# Patient Record
Sex: Female | Born: 1970 | Race: Black or African American | Hispanic: No | Marital: Married | Smoking: Former smoker
Health system: Southern US, Community
[De-identification: ages and names within clinical notes are randomized; demographics above are authoritative.]

## PROBLEM LIST (undated history)

## (undated) DIAGNOSIS — N83209 Unspecified ovarian cyst, unspecified side: Secondary | ICD-10-CM

## (undated) DIAGNOSIS — M533 Sacrococcygeal disorders, not elsewhere classified: Secondary | ICD-10-CM

## (undated) DIAGNOSIS — F32A Depression, unspecified: Secondary | ICD-10-CM

## (undated) DIAGNOSIS — R5383 Other fatigue: Secondary | ICD-10-CM

## (undated) DIAGNOSIS — K279 Peptic ulcer, site unspecified, unspecified as acute or chronic, without hemorrhage or perforation: Secondary | ICD-10-CM

## (undated) DIAGNOSIS — K635 Polyp of colon: Secondary | ICD-10-CM

## (undated) DIAGNOSIS — F329 Major depressive disorder, single episode, unspecified: Secondary | ICD-10-CM

## (undated) DIAGNOSIS — I10 Essential (primary) hypertension: Secondary | ICD-10-CM

## (undated) DIAGNOSIS — K219 Gastro-esophageal reflux disease without esophagitis: Secondary | ICD-10-CM

## (undated) DIAGNOSIS — D649 Anemia, unspecified: Secondary | ICD-10-CM

## (undated) DIAGNOSIS — B999 Unspecified infectious disease: Secondary | ICD-10-CM

## (undated) DIAGNOSIS — K8020 Calculus of gallbladder without cholecystitis without obstruction: Secondary | ICD-10-CM

## (undated) DIAGNOSIS — O2230 Deep phlebothrombosis in pregnancy, unspecified trimester: Secondary | ICD-10-CM

## (undated) DIAGNOSIS — G709 Myoneural disorder, unspecified: Secondary | ICD-10-CM

## (undated) DIAGNOSIS — R87629 Unspecified abnormal cytological findings in specimens from vagina: Secondary | ICD-10-CM

## (undated) DIAGNOSIS — E7800 Pure hypercholesterolemia, unspecified: Secondary | ICD-10-CM

## (undated) DIAGNOSIS — E669 Obesity, unspecified: Secondary | ICD-10-CM

## (undated) DIAGNOSIS — M1990 Unspecified osteoarthritis, unspecified site: Secondary | ICD-10-CM

## (undated) DIAGNOSIS — K449 Diaphragmatic hernia without obstruction or gangrene: Secondary | ICD-10-CM

## (undated) HISTORY — DX: Anemia, unspecified: D64.9

## (undated) HISTORY — PX: COLONOSCOPY: SHX174

## (undated) HISTORY — DX: Deep phlebothrombosis in pregnancy, unspecified trimester: O22.30

## (undated) HISTORY — DX: Obesity, unspecified: E66.9

## (undated) HISTORY — DX: Polyp of colon: K63.5

## (undated) HISTORY — DX: Diaphragmatic hernia without obstruction or gangrene: K44.9

## (undated) HISTORY — DX: Essential (primary) hypertension: I10

## (undated) HISTORY — DX: Unspecified ovarian cyst, unspecified side: N83.209

## (undated) HISTORY — DX: Gastro-esophageal reflux disease without esophagitis: K21.9

## (undated) HISTORY — DX: Calculus of gallbladder without cholecystitis without obstruction: K80.20

## (undated) HISTORY — PX: POLYPECTOMY: SHX149

## (undated) HISTORY — PX: BREAST LUMPECTOMY: SHX2

## (undated) HISTORY — DX: Other fatigue: R53.83

## (undated) HISTORY — PX: BREAST EXCISIONAL BIOPSY: SUR124

## (undated) HISTORY — DX: Sacrococcygeal disorders, not elsewhere classified: M53.3

## (undated) HISTORY — DX: Pure hypercholesterolemia, unspecified: E78.00

## (undated) HISTORY — PX: TUBAL LIGATION: SHX77

## (undated) HISTORY — DX: Peptic ulcer, site unspecified, unspecified as acute or chronic, without hemorrhage or perforation: K27.9

## (undated) HISTORY — DX: Major depressive disorder, single episode, unspecified: F32.9

## (undated) HISTORY — DX: Depression, unspecified: F32.A

## (undated) HISTORY — DX: Unspecified osteoarthritis, unspecified site: M19.90

---

## 2001-06-14 ENCOUNTER — Encounter: Admission: RE | Admit: 2001-06-14 | Discharge: 2001-06-14 | Payer: Self-pay | Admitting: Family Medicine

## 2001-07-22 ENCOUNTER — Emergency Department (HOSPITAL_COMMUNITY): Admission: EM | Admit: 2001-07-22 | Discharge: 2001-07-22 | Payer: Self-pay | Admitting: Emergency Medicine

## 2001-07-23 ENCOUNTER — Encounter: Admission: RE | Admit: 2001-07-23 | Discharge: 2001-07-23 | Payer: Self-pay | Admitting: Family Medicine

## 2001-07-26 ENCOUNTER — Encounter: Admission: RE | Admit: 2001-07-26 | Discharge: 2001-07-26 | Payer: Self-pay | Admitting: Family Medicine

## 2001-07-31 ENCOUNTER — Encounter: Admission: RE | Admit: 2001-07-31 | Discharge: 2001-07-31 | Payer: Self-pay | Admitting: Family Medicine

## 2001-07-31 ENCOUNTER — Ambulatory Visit (HOSPITAL_COMMUNITY): Admission: RE | Admit: 2001-07-31 | Discharge: 2001-07-31 | Payer: Self-pay | Admitting: Family Medicine

## 2001-07-31 ENCOUNTER — Ambulatory Visit (HOSPITAL_COMMUNITY): Admission: RE | Admit: 2001-07-31 | Discharge: 2001-07-31 | Payer: Self-pay | Admitting: *Deleted

## 2001-08-05 ENCOUNTER — Emergency Department (HOSPITAL_COMMUNITY): Admission: EM | Admit: 2001-08-05 | Discharge: 2001-08-05 | Payer: Self-pay

## 2001-08-07 ENCOUNTER — Encounter: Admission: RE | Admit: 2001-08-07 | Discharge: 2001-08-07 | Payer: Self-pay | Admitting: Family Medicine

## 2001-08-10 ENCOUNTER — Emergency Department (HOSPITAL_COMMUNITY): Admission: EM | Admit: 2001-08-10 | Discharge: 2001-08-10 | Payer: Self-pay | Admitting: Emergency Medicine

## 2001-08-13 ENCOUNTER — Emergency Department (HOSPITAL_COMMUNITY): Admission: EM | Admit: 2001-08-13 | Discharge: 2001-08-14 | Payer: Self-pay | Admitting: Emergency Medicine

## 2001-08-13 ENCOUNTER — Encounter: Admission: RE | Admit: 2001-08-13 | Discharge: 2001-08-13 | Payer: Self-pay | Admitting: Family Medicine

## 2001-09-26 ENCOUNTER — Encounter: Admission: RE | Admit: 2001-09-26 | Discharge: 2001-09-26 | Payer: Self-pay | Admitting: Family Medicine

## 2001-11-04 ENCOUNTER — Encounter: Admission: RE | Admit: 2001-11-04 | Discharge: 2001-11-04 | Payer: Self-pay | Admitting: Family Medicine

## 2001-11-06 ENCOUNTER — Encounter: Admission: RE | Admit: 2001-11-06 | Discharge: 2001-11-06 | Payer: Self-pay | Admitting: Family Medicine

## 2001-12-04 ENCOUNTER — Encounter: Admission: RE | Admit: 2001-12-04 | Discharge: 2002-01-02 | Payer: Self-pay | Admitting: Sports Medicine

## 2002-02-05 ENCOUNTER — Encounter: Admission: RE | Admit: 2002-02-05 | Discharge: 2002-02-05 | Payer: Self-pay | Admitting: Family Medicine

## 2002-07-30 ENCOUNTER — Encounter (INDEPENDENT_AMBULATORY_CARE_PROVIDER_SITE_OTHER): Payer: Self-pay | Admitting: *Deleted

## 2002-07-31 ENCOUNTER — Encounter: Admission: RE | Admit: 2002-07-31 | Discharge: 2002-07-31 | Payer: Self-pay | Admitting: Pediatrics

## 2002-08-18 ENCOUNTER — Encounter: Admission: RE | Admit: 2002-08-18 | Discharge: 2002-08-18 | Payer: Self-pay | Admitting: Family Medicine

## 2002-10-14 ENCOUNTER — Encounter: Admission: RE | Admit: 2002-10-14 | Discharge: 2002-10-14 | Payer: Self-pay | Admitting: Family Medicine

## 2002-10-31 ENCOUNTER — Encounter: Admission: RE | Admit: 2002-10-31 | Discharge: 2002-10-31 | Payer: Self-pay | Admitting: Family Medicine

## 2002-12-18 ENCOUNTER — Encounter: Payer: Self-pay | Admitting: Chiropractic Medicine

## 2002-12-18 ENCOUNTER — Encounter: Admission: RE | Admit: 2002-12-18 | Discharge: 2002-12-18 | Payer: Self-pay | Admitting: Chiropractic Medicine

## 2003-01-29 ENCOUNTER — Encounter: Admission: RE | Admit: 2003-01-29 | Discharge: 2003-01-29 | Payer: Self-pay | Admitting: Family Medicine

## 2003-12-12 ENCOUNTER — Emergency Department (HOSPITAL_COMMUNITY): Admission: EM | Admit: 2003-12-12 | Discharge: 2003-12-12 | Payer: Self-pay | Admitting: Emergency Medicine

## 2004-01-13 ENCOUNTER — Encounter: Payer: Self-pay | Admitting: Gastroenterology

## 2004-08-24 ENCOUNTER — Encounter: Admission: RE | Admit: 2004-08-24 | Discharge: 2004-08-24 | Payer: Self-pay | Admitting: Family Medicine

## 2004-10-29 ENCOUNTER — Encounter: Admission: RE | Admit: 2004-10-29 | Discharge: 2004-10-29 | Payer: Self-pay | Admitting: Rheumatology

## 2005-01-09 ENCOUNTER — Emergency Department (HOSPITAL_COMMUNITY): Admission: EM | Admit: 2005-01-09 | Discharge: 2005-01-09 | Payer: Self-pay | Admitting: Emergency Medicine

## 2005-11-23 ENCOUNTER — Emergency Department (HOSPITAL_COMMUNITY): Admission: EM | Admit: 2005-11-23 | Discharge: 2005-11-23 | Payer: Self-pay | Admitting: Emergency Medicine

## 2006-01-13 ENCOUNTER — Emergency Department (HOSPITAL_COMMUNITY): Admission: EM | Admit: 2006-01-13 | Discharge: 2006-01-13 | Payer: Self-pay | Admitting: Family Medicine

## 2007-01-24 DIAGNOSIS — R002 Palpitations: Secondary | ICD-10-CM | POA: Insufficient documentation

## 2007-01-24 DIAGNOSIS — K219 Gastro-esophageal reflux disease without esophagitis: Secondary | ICD-10-CM | POA: Insufficient documentation

## 2007-01-25 ENCOUNTER — Encounter (INDEPENDENT_AMBULATORY_CARE_PROVIDER_SITE_OTHER): Payer: Self-pay | Admitting: *Deleted

## 2007-05-13 ENCOUNTER — Encounter: Admission: RE | Admit: 2007-05-13 | Discharge: 2007-05-13 | Payer: Self-pay | Admitting: Family Medicine

## 2007-06-18 ENCOUNTER — Emergency Department (HOSPITAL_COMMUNITY): Admission: EM | Admit: 2007-06-18 | Discharge: 2007-06-18 | Payer: Self-pay | Admitting: Emergency Medicine

## 2007-11-22 ENCOUNTER — Emergency Department (HOSPITAL_COMMUNITY): Admission: EM | Admit: 2007-11-22 | Discharge: 2007-11-22 | Payer: Self-pay | Admitting: Family Medicine

## 2008-06-07 ENCOUNTER — Emergency Department (HOSPITAL_COMMUNITY): Admission: EM | Admit: 2008-06-07 | Discharge: 2008-06-07 | Payer: Self-pay | Admitting: Family Medicine

## 2008-08-19 ENCOUNTER — Emergency Department (HOSPITAL_COMMUNITY): Admission: EM | Admit: 2008-08-19 | Discharge: 2008-08-19 | Payer: Self-pay | Admitting: Family Medicine

## 2008-12-16 ENCOUNTER — Ambulatory Visit (HOSPITAL_COMMUNITY): Admission: RE | Admit: 2008-12-16 | Discharge: 2008-12-16 | Payer: Self-pay | Admitting: Family Medicine

## 2009-03-03 ENCOUNTER — Ambulatory Visit: Payer: Self-pay | Admitting: Gastroenterology

## 2009-03-08 ENCOUNTER — Emergency Department (HOSPITAL_COMMUNITY): Admission: EM | Admit: 2009-03-08 | Discharge: 2009-03-08 | Payer: Self-pay | Admitting: Family Medicine

## 2009-04-06 ENCOUNTER — Ambulatory Visit (HOSPITAL_COMMUNITY): Admission: RE | Admit: 2009-04-06 | Discharge: 2009-04-06 | Payer: Self-pay | Admitting: Internal Medicine

## 2010-06-14 ENCOUNTER — Ambulatory Visit (HOSPITAL_COMMUNITY): Admission: RE | Admit: 2010-06-14 | Discharge: 2010-06-14 | Payer: Self-pay | Admitting: Family Medicine

## 2010-07-23 ENCOUNTER — Ambulatory Visit: Payer: Self-pay | Admitting: Obstetrics and Gynecology

## 2010-07-23 ENCOUNTER — Other Ambulatory Visit: Payer: Self-pay | Admitting: Obstetrics & Gynecology

## 2010-07-23 ENCOUNTER — Emergency Department (HOSPITAL_COMMUNITY): Admission: EM | Admit: 2010-07-23 | Discharge: 2010-07-23 | Payer: Self-pay | Admitting: Emergency Medicine

## 2010-11-27 HISTORY — PX: COLPOSCOPY: SHX161

## 2010-11-27 LAB — HM PAP SMEAR

## 2010-12-18 ENCOUNTER — Encounter: Payer: Self-pay | Admitting: Family Medicine

## 2011-01-12 ENCOUNTER — Emergency Department (HOSPITAL_COMMUNITY): Payer: 59

## 2011-01-12 ENCOUNTER — Emergency Department (HOSPITAL_COMMUNITY)
Admission: EM | Admit: 2011-01-12 | Discharge: 2011-01-13 | Disposition: A | Discharge status: Home / Self Care | Payer: 59 | Attending: Emergency Medicine | Admitting: Emergency Medicine

## 2011-01-12 DIAGNOSIS — F3289 Other specified depressive episodes: Secondary | ICD-10-CM | POA: Insufficient documentation

## 2011-01-12 DIAGNOSIS — R799 Abnormal finding of blood chemistry, unspecified: Secondary | ICD-10-CM | POA: Insufficient documentation

## 2011-01-12 DIAGNOSIS — M25519 Pain in unspecified shoulder: Secondary | ICD-10-CM | POA: Insufficient documentation

## 2011-01-12 DIAGNOSIS — F329 Major depressive disorder, single episode, unspecified: Secondary | ICD-10-CM | POA: Insufficient documentation

## 2011-01-12 DIAGNOSIS — R002 Palpitations: Secondary | ICD-10-CM | POA: Insufficient documentation

## 2011-01-12 LAB — POCT I-STAT, CHEM 8
Calcium, Ion: 1.14 mmol/L (ref 1.12–1.32)
Chloride: 107 mEq/L (ref 96–112)
Glucose, Bld: 95 mg/dL (ref 70–99)
HCT: 36 % (ref 36.0–46.0)

## 2011-01-12 MED ORDER — IOHEXOL 300 MG/ML  SOLN: 100.0000 mL | Freq: Once | INTRAMUSCULAR | Status: AC | PRN

## 2011-01-13 MED ORDER — IOHEXOL 300 MG/ML  SOLN
100.0000 mL | Freq: Once | INTRAMUSCULAR | Status: AC | PRN
  Administered 2011-01-13: 100 mL via INTRAVENOUS

## 2011-02-10 LAB — URINALYSIS, ROUTINE W REFLEX MICROSCOPIC
Nitrite: NEGATIVE
Nitrite: NEGATIVE
Specific Gravity, Urine: <1.005 — ABNORMAL LOW (ref 1.005–1.030)
Specific Gravity, Urine: 1.005 (ref 1.005–1.030)
Urobilinogen, UA: 0.2 mg/dL (ref 0.0–1.0)
pH: 6.5 (ref 5.0–8.0)

## 2011-02-10 LAB — CBC
HCT: 36.2 % (ref 36.0–46.0)
Hemoglobin: 12.1 g/dL (ref 12.0–15.0)
MCH: 30.9 pg (ref 26.0–34.0)
MCHC: 33.5 g/dL (ref 30.0–36.0)

## 2011-02-10 LAB — COMPREHENSIVE METABOLIC PANEL
CO2: 29 mEq/L (ref 19–32)
Calcium: 9.5 mg/dL (ref 8.4–10.5)
Creatinine, Ser: 0.77 mg/dL (ref 0.4–1.2)
GFR calc Af Amer: >60 mL/min (ref >60)
GFR calc non Af Amer: >60 mL/min (ref >60)
Glucose, Bld: 99 mg/dL (ref 70–99)

## 2011-02-10 LAB — POCT CARDIAC MARKERS: Myoglobin, poc: 93.3 ng/mL (ref 12–200)

## 2011-04-19 ENCOUNTER — Other Ambulatory Visit: Payer: Self-pay | Admitting: Obstetrics and Gynecology

## 2011-04-19 ENCOUNTER — Ambulatory Visit (HOSPITAL_COMMUNITY)
Admission: RE | Admit: 2011-04-19 | Discharge: 2011-04-19 | Disposition: A | Discharge status: Home / Self Care | Payer: 59 | Source: Ambulatory Visit | Attending: Obstetrics and Gynecology | Admitting: Obstetrics and Gynecology

## 2011-04-19 DIAGNOSIS — N920 Excessive and frequent menstruation with regular cycle: Secondary | ICD-10-CM | POA: Insufficient documentation

## 2011-04-19 DIAGNOSIS — N840 Polyp of corpus uteri: Secondary | ICD-10-CM | POA: Insufficient documentation

## 2011-04-19 LAB — CBC
HCT: 36.6 % (ref 36.0–46.0)
MCH: 29.3 pg (ref 26.0–34.0)
MCV: 90.1 fL (ref 78.0–100.0)
Platelets: 271 10*3/uL (ref 150–400)
RBC: 4.06 MIL/uL (ref 3.87–5.11)
RDW: 13.2 % (ref 11.5–15.5)

## 2011-05-07 NOTE — Op Note (Signed)
  Rebecca, [name] NO.:  [id number]  MEDICAL RECORD NO.:  [id number]           PATIENT TYPE:  O  LOCATION:  [facility]                          [facility]  PHYSICIAN:  Maxie Better, M.D.DATE OF BIRTH:  04-18-1971  DATE OF PROCEDURE:  04/19/2011 DATE OF DISCHARGE:                              OPERATIVE REPORT   PREOPERATIVE DIAGNOSES:  Endometrial mass, menometrorrhagia/dysfunctional uterine bleeding.  PROCEDURES:  Diagnostic hysteroscopy, hysteroscopic resection, endometrial polyps, dilation and curettage.  POSTOPERATIVE DIAGNOSES:  Endometrial polyps, menometrorrhagia.  ANESTHESIA:  General, paracervical block.  SURGEON:  Maxie Better, MD  ASSISTANT:  None.  PROCEDURE:  Under adequate general anesthesia, the patient was placed in the dorsal lithotomy position.  She was sterilely prepped and draped in usual fashion.  The bladder was catheterized for large amount of urine. Examination under anesthesia revealed an anteverted uterus.  No adnexal masses could be appreciated.  Bivalve speculum was placed in the vagina. A 20 mL of 1% Nesacaine was injected paracervically at 3 and 9 o'clock position.  The anterior lip of the cervix was grasped with a single- tooth tenaculum.  The cervix was then serially dilated up to #27 [facility] dilator.  A resectoscope with a single loop was introduced into the uterine cavity.  Both tubal ostia's were seen.  Polypoid lesion was noted frontally and to the left of the left tubal ostia.  Focal endometrial thickening was noted anteriorly.  All of these areas were resected.  The resectoscope was then removed.  The cavity was then curetted for moderate amount of tissue.  The resectoscope was inserted. The cavity had no other lesions noted.  The endocervical canal was inspected.  All instruments were then removed from the vagina.  Specimen was endometrial curetting with endometrial polyps sent to pathology. Estimated  blood loss was minimal.  Fluid deficit was 20 mL. Complication was none.  The patient tolerated the procedure well and was transferred to recovery room in stable condition.     Maxie Better, M.D.     [place]/[facility]  D:  04/19/2011  T:  04/20/2011  Job:  [id number]  Electronically Signed by Nena Jordan Kelis Plasse M.D. on 05/07/2011 08:46:04 PM

## 2011-07-07 ENCOUNTER — Other Ambulatory Visit: Payer: Self-pay | Admitting: Obstetrics and Gynecology

## 2011-07-07 ENCOUNTER — Encounter: Payer: Self-pay | Admitting: Gastroenterology

## 2011-07-07 DIAGNOSIS — Z1231 Encounter for screening mammogram for malignant neoplasm of breast: Secondary | ICD-10-CM

## 2011-07-13 ENCOUNTER — Ambulatory Visit
Admission: RE | Admit: 2011-07-13 | Discharge: 2011-07-13 | Disposition: A | Discharge status: Home / Self Care | Payer: 59 | Source: Ambulatory Visit | Attending: Obstetrics and Gynecology | Admitting: Obstetrics and Gynecology

## 2011-07-13 DIAGNOSIS — Z1231 Encounter for screening mammogram for malignant neoplasm of breast: Secondary | ICD-10-CM

## 2011-08-03 ENCOUNTER — Ambulatory Visit: Payer: 59 | Admitting: Gastroenterology

## 2011-08-08 ENCOUNTER — Inpatient Hospital Stay (INDEPENDENT_AMBULATORY_CARE_PROVIDER_SITE_OTHER)
Admission: RE | Admit: 2011-08-08 | Discharge: 2011-08-08 | Disposition: A | Discharge status: Home / Self Care | Payer: 59 | Source: Ambulatory Visit | Attending: Family Medicine | Admitting: Family Medicine

## 2011-08-08 DIAGNOSIS — K2970 Gastritis, unspecified, without bleeding: Secondary | ICD-10-CM

## 2011-08-08 LAB — POCT URINALYSIS DIP (DEVICE)
Glucose, UA: NEGATIVE mg/dL
Hgb urine dipstick: NEGATIVE
Nitrite: NEGATIVE
Protein, ur: NEGATIVE mg/dL
Urobilinogen, UA: 0.2 mg/dL (ref 0.0–1.0)

## 2011-08-08 LAB — COMPREHENSIVE METABOLIC PANEL
BUN: 9 mg/dL (ref 6–23)
CO2: 28 mEq/L (ref 19–32)
Calcium: 9.9 mg/dL (ref 8.4–10.5)
Chloride: 103 mEq/L (ref 96–112)
Creatinine, Ser: 0.73 mg/dL (ref 0.50–1.10)
GFR calc Af Amer: >60 mL/min (ref >60)
GFR calc non Af Amer: >60 mL/min (ref >60)
Glucose, Bld: 104 mg/dL — ABNORMAL HIGH (ref 70–99)
Total Bilirubin: 0.4 mg/dL (ref 0.3–1.2)

## 2011-08-08 LAB — POCT PREGNANCY, URINE: Preg Test, Ur: NEGATIVE

## 2011-08-08 LAB — POCT I-STAT, CHEM 8
BUN: 9 mg/dL (ref 6–23)
Calcium, Ion: 1.19 mmol/L (ref 1.12–1.32)
Glucose, Bld: 104 mg/dL — ABNORMAL HIGH (ref 70–99)
TCO2: 26 mmol/L (ref 0–100)

## 2011-08-08 LAB — AMYLASE: Amylase: 106 U/L — ABNORMAL HIGH (ref 0–105)

## 2011-08-08 LAB — LIPASE, BLOOD: Lipase: 33 U/L (ref 11–59)

## 2011-08-24 LAB — POCT URINALYSIS DIP (DEVICE)
Bilirubin Urine: NEGATIVE
Hgb urine dipstick: NEGATIVE
Ketones, ur: NEGATIVE
Protein, ur: NEGATIVE
Specific Gravity, Urine: 1.015
pH: 5.5

## 2011-08-24 LAB — URINE CULTURE: Colony Count: >=100000

## 2011-08-28 LAB — POCT URINALYSIS DIP (DEVICE)
Glucose, UA: NEGATIVE
Nitrite: NEGATIVE
Operator id: 247071
Protein, ur: NEGATIVE
Specific Gravity, Urine: 1.01
Urobilinogen, UA: 0.2

## 2011-08-29 ENCOUNTER — Encounter: Payer: Self-pay | Admitting: Internal Medicine

## 2011-09-01 LAB — STREP A DNA PROBE

## 2011-09-01 LAB — POCT RAPID STREP A: Streptococcus, Group A Screen (Direct): NEGATIVE

## 2011-09-14 ENCOUNTER — Encounter: Payer: Self-pay | Admitting: Internal Medicine

## 2011-09-21 ENCOUNTER — Ambulatory Visit: Payer: 59 | Admitting: Internal Medicine

## 2011-11-18 ENCOUNTER — Emergency Department (HOSPITAL_COMMUNITY)
Admission: EM | Admit: 2011-11-18 | Discharge: 2011-11-18 | Disposition: A | Discharge status: Home / Self Care | Payer: 59 | Attending: Emergency Medicine | Admitting: Emergency Medicine

## 2011-11-18 ENCOUNTER — Encounter (HOSPITAL_COMMUNITY): Payer: Self-pay | Admitting: *Deleted

## 2011-11-18 ENCOUNTER — Other Ambulatory Visit: Payer: Self-pay

## 2011-11-18 ENCOUNTER — Emergency Department (HOSPITAL_COMMUNITY): Payer: 59

## 2011-11-18 DIAGNOSIS — R10812 Left upper quadrant abdominal tenderness: Secondary | ICD-10-CM | POA: Insufficient documentation

## 2011-11-18 DIAGNOSIS — K8020 Calculus of gallbladder without cholecystitis without obstruction: Secondary | ICD-10-CM | POA: Insufficient documentation

## 2011-11-18 DIAGNOSIS — R109 Unspecified abdominal pain: Secondary | ICD-10-CM | POA: Insufficient documentation

## 2011-11-18 DIAGNOSIS — N200 Calculus of kidney: Secondary | ICD-10-CM | POA: Insufficient documentation

## 2011-11-18 LAB — CBC
HCT: 37.7 % (ref 36.0–46.0)
Hemoglobin: 12.7 g/dL (ref 12.0–15.0)
MCHC: 33.7 g/dL (ref 30.0–36.0)
MCV: 88.9 fL (ref 78.0–100.0)
RDW: 13 % (ref 11.5–15.5)
WBC: 5.2 10*3/uL (ref 4.0–10.5)

## 2011-11-18 LAB — URINALYSIS, ROUTINE W REFLEX MICROSCOPIC
Ketones, ur: NEGATIVE mg/dL
Leukocytes, UA: NEGATIVE
Nitrite: NEGATIVE
Protein, ur: NEGATIVE mg/dL
Urobilinogen, UA: 0.2 mg/dL (ref 0.0–1.0)

## 2011-11-18 LAB — LIPASE, BLOOD: Lipase: 44 U/L (ref 11–59)

## 2011-11-18 LAB — COMPREHENSIVE METABOLIC PANEL
BUN: 11 mg/dL (ref 6–23)
CO2: 25 mEq/L (ref 19–32)
Calcium: 9.6 mg/dL (ref 8.4–10.5)
Chloride: 104 mEq/L (ref 96–112)
Creatinine, Ser: 0.78 mg/dL (ref 0.50–1.10)
GFR calc Af Amer: >90 mL/min (ref >90)
GFR calc non Af Amer: >90 mL/min (ref >90)
Total Bilirubin: 0.5 mg/dL (ref 0.3–1.2)

## 2011-11-18 LAB — DIFFERENTIAL
Basophils Absolute: 0 10*3/uL (ref 0.0–0.1)
Eosinophils Relative: 3 % (ref 0–5)
Lymphocytes Relative: 48 % — ABNORMAL HIGH (ref 12–46)
Monocytes Absolute: 0.6 10*3/uL (ref 0.1–1.0)
Monocytes Relative: 11 % (ref 3–12)
Neutro Abs: 2 10*3/uL (ref 1.7–7.7)

## 2011-11-18 LAB — URINE MICROSCOPIC-ADD ON

## 2011-11-18 MED ORDER — HYDROCODONE-ACETAMINOPHEN 5-325 MG PO TABS: 1.0000 | ORAL_TABLET | Freq: Four times a day (QID) | ORAL | Status: AC | PRN

## 2011-11-18 MED ORDER — KETOROLAC TROMETHAMINE 30 MG/ML IJ SOLN
30.0000 mg | Freq: Once | INTRAMUSCULAR | Status: AC
  Administered 2011-11-18: 30 mg via INTRAVENOUS
  Filled 2011-11-18: qty 19

## 2011-11-18 MED ORDER — HYDROMORPHONE HCL PF 1 MG/ML IJ SOLN
1.0000 mg | Freq: Once | INTRAMUSCULAR | Status: DC
  Filled 2011-11-18: qty 1

## 2011-11-18 MED ORDER — ACETAMINOPHEN 325 MG PO TABS
650.0000 mg | ORAL_TABLET | Freq: Once | ORAL | Status: AC
  Administered 2011-11-18: 650 mg via ORAL
  Filled 2011-11-18: qty 2

## 2011-11-18 MED ORDER — ONDANSETRON HCL 4 MG/2ML IJ SOLN
4.0000 mg | Freq: Once | INTRAMUSCULAR | Status: AC
  Administered 2011-11-18: 4 mg via INTRAVENOUS
  Filled 2011-11-18: qty 2

## 2011-11-18 MED ORDER — SODIUM CHLORIDE 0.9 % IV SOLN
999.0000 mL | INTRAVENOUS | Status: DC
  Administered 2011-11-18: 999 mL via INTRAVENOUS

## 2011-11-18 NOTE — ED Notes (Signed)
Patient is resting comfortably. 

## 2011-11-18 NOTE — ED Notes (Signed)
Patient c/o pain in the LUQ and states that she has had problems w/ her bowels before and that sometimes she has to disimpact herself for relief.  At present she states that she has her period which makes her bowels worse.  Her PCP is attempting to get her an appointment with a GI physician.  Abdomen is soft and tender only in the LUQ.

## 2011-11-18 NOTE — ED Provider Notes (Signed)
History     CSN: [id number]  Arrival date & time 11/18/11  1102   First MD Initiated Contact with Patient 11/18/11 1126      Chief Complaint  Patient presents with  . Abdominal Pain  . Nausea    (Consider location/radiation/quality/duration/timing/severity/associated sxs/prior treatment) HPI Patient has been having pain in her left upper abdomen for several months. Patient states she has seen her primary Dr. and had some blood tests and was started on antacids. She is waiting for a referral to a GI specialist. Patient denies any vomiting or diarrhea although she has had some nausea. She has had some episodes of constipation as well. She has been able to eat and drink although she does feel like her appetite has been decreased.  She has not noticed any blood in her stool. She has not had any chest pain or shortness of breath. Patient does have history of problems with esophagitis and duodenitis in the past. Past Medical History  Diagnosis Date  . 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 Anxiety   . PUD (peptic ulcer disease)   . Hiatal hernia   . Duodenitis   . Esophagitis   . Vitamin D deficiency   . Fatigue   . Sacroiliac pain   . Depression     Past Surgical History  Procedure Date  . Tubal ligation   . Breast lumpectomy     benign    Family History  Problem Relation Age of Onset  . Aneurysm Mother   . Heart disease Mother   . Diabetes Maternal Aunt     History  Substance Use Topics  . Smoking status: Current Some Day Smoker  . Smokeless tobacco: Not on file  . Alcohol Use: No    OB History    Grav Para Term Preterm Abortions TAB SAB Ect Mult Living                  Review of Systems  All other systems reviewed and are negative.    Allergies  Review of patient's allergies indicates no known allergies.  Home Medications   Current Outpatient Rx  Name Route Sig Dispense Refill  . VITAMIN B COMPLEX PO Oral Take 1 tablet by mouth daily.      Marland Kitchen  VITAMIN D3 50000 UNITS PO CAPS Oral Take 1 capsule by mouth 2 (two) times daily.      Marland Kitchen CITALOPRAM HYDROBROMIDE 10 MG PO TABS Oral Take 10 mg by mouth daily.      Carma Leaven M PLUS PO TABS Oral Take 1 tablet by mouth daily.      Marland Kitchen PANTOPRAZOLE SODIUM 40 MG PO TBEC Oral Take 40 mg by mouth daily.      . DULOXETINE HCL 30 MG PO CPEP Oral Take 30 mg by mouth daily.        BP 132/62  Pulse 114  Temp(Src) 98.3 F (36.8 C) (Oral)  Resp 22  SpO2 100%  Physical Exam  Nursing note and vitals reviewed. Constitutional: She appears well-developed and well-nourished. No distress.  HENT:  Head: Normocephalic and atraumatic.  Right Ear: External ear normal.  Left Ear: External ear normal.  Eyes: Conjunctivae are normal. Right eye exhibits no discharge. Left eye exhibits no discharge. No scleral icterus.  Neck: Neck supple. No tracheal deviation present.  Cardiovascular: Normal rate, regular rhythm and intact distal pulses.   Pulmonary/Chest: Effort normal and breath sounds normal. No stridor. No respiratory distress. She has no  wheezes. She has no rales.  Abdominal: Soft. Bowel sounds are normal. She exhibits no distension and no mass (mild left upper quadrant). There is tenderness. There is no rebound and no guarding.  Musculoskeletal: She exhibits no edema and no tenderness.  Neurological: She is alert. She has normal strength. No sensory deficit. Cranial nerve deficit:  no gross defecits noted. She exhibits normal muscle tone. She displays no seizure activity. Coordination normal.  Skin: Skin is warm and dry. No rash noted.  Psychiatric: She has a normal mood and affect.    ED Course  Procedures (including critical care time)  Date: 11/18/2011  Rate: 72  Rhythm: normal sinus rhythm  QRS Axis: normal  Intervals: normal  ST/T Wave abnormalities: normal  Conduction Disutrbances:none  Narrative Interpretation:   Old EKG Reviewed: unchanged   Labs Reviewed  DIFFERENTIAL - Abnormal; Notable  for the following:    Neutrophils Relative 38 (*)    Lymphocytes Relative 48 (*)    All other components within normal limits  URINALYSIS, ROUTINE W REFLEX MICROSCOPIC - Abnormal; Notable for the following:    Hgb urine dipstick LARGE (*)    All other components within normal limits  CBC  COMPREHENSIVE METABOLIC PANEL  LIPASE, BLOOD  URINE MICROSCOPIC-ADD ON   Ct Abdomen Pelvis Wo Contrast  11/18/2011  *RADIOLOGY REPORT*  Clinical Data: Hematuria, rule out stones  CT ABDOMEN AND PELVIS WITHOUT CONTRAST  Technique:  Multidetector CT imaging of the abdomen and pelvis was performed following the standard protocol without intravenous contrast.  Comparison: CT thorax 01/12/2011  Findings: Lung bases are clear.  No pericardial fluid.  Non-IV contrast images demonstrate no focal hepatic lesion.  There is a small 6 mm gallstone in the gallbladder.  The pancreas, spleen, adrenal glands, and adrenal glands are normal.  There is a 1 mm nonobstructing calculus within the left kidney.  No evidence of ureterolithiasis or obstructive uropathy.  There multiple vascular calcifications within the pelvis.  Cannot exclude distal ureteral stone  The stomach, small bowel, and colon are normal.  Abdominal aorta is normal caliber.  No retroperitoneal lymphadenopathy.  No free fluid in the pelvis.  Uterus and ovaries are normal.  The pelvic lymphadenopathy. Review of  bone windows demonstrates no aggressive osseous lesions.  IMPRESSION:  1.  Left nephrolithiasis without evidence of obstructive uropathy. 2.  Multiple vascular calcifications within the lower pelvis.  No convincing evidence of ureteral calculus. 3.  Small gallstone without evidence of cholecystitis.  Original Report Authenticated By: Genevive Bi, M.D.     1. Abdominal pain   2. Kidney stones     12:44 PM Pt refused dilaudid.  She needs to drive home.  MDM  Patient does have kidney stones on CT scan but there is no evidence of urolithiasis.  Therefore not certain if this is related to the pain that she's been having. This has been ongoing for months she is scheduled to see a GI Dr. At this point there does not appear to be any evidence of an acute emergency medical condition. Patient is already on antacids. I will discharge her home with a prescription for pain medications.        Celene Kras, MD 11/18/11 [phone]

## 2011-11-18 NOTE — ED Notes (Signed)
Pt presents with abd pain, left upper quad, has been to PMD and waiting for appointment with GI, pt c/o pain, crying in triage, has nausea, constipation reported, last BM this am

## 2011-11-24 ENCOUNTER — Encounter: Payer: Self-pay | Admitting: Internal Medicine

## 2011-12-01 ENCOUNTER — Encounter: Payer: Self-pay | Admitting: Internal Medicine

## 2011-12-05 ENCOUNTER — Encounter: Payer: Self-pay | Admitting: Internal Medicine

## 2011-12-12 ENCOUNTER — Ambulatory Visit (INDEPENDENT_AMBULATORY_CARE_PROVIDER_SITE_OTHER): Payer: 59 | Admitting: Internal Medicine

## 2011-12-12 ENCOUNTER — Other Ambulatory Visit (INDEPENDENT_AMBULATORY_CARE_PROVIDER_SITE_OTHER): Payer: 59

## 2011-12-12 ENCOUNTER — Telehealth: Payer: Self-pay | Admitting: *Deleted

## 2011-12-12 ENCOUNTER — Encounter: Payer: Self-pay | Admitting: Internal Medicine

## 2011-12-12 DIAGNOSIS — R194 Change in bowel habit: Secondary | ICD-10-CM

## 2011-12-12 DIAGNOSIS — E559 Vitamin D deficiency, unspecified: Secondary | ICD-10-CM | POA: Insufficient documentation

## 2011-12-12 DIAGNOSIS — F325 Major depressive disorder, single episode, in full remission: Secondary | ICD-10-CM | POA: Insufficient documentation

## 2011-12-12 DIAGNOSIS — R198 Other specified symptoms and signs involving the digestive system and abdomen: Secondary | ICD-10-CM

## 2011-12-12 DIAGNOSIS — K3189 Other diseases of stomach and duodenum: Secondary | ICD-10-CM

## 2011-12-12 DIAGNOSIS — K5900 Constipation, unspecified: Secondary | ICD-10-CM | POA: Insufficient documentation

## 2011-12-12 DIAGNOSIS — R1013 Epigastric pain: Secondary | ICD-10-CM

## 2011-12-12 DIAGNOSIS — F3341 Major depressive disorder, recurrent, in partial remission: Secondary | ICD-10-CM | POA: Insufficient documentation

## 2011-12-12 MED ORDER — HYOSCYAMINE SULFATE 0.125 MG SL SUBL: 0.1250 mg | SUBLINGUAL_TABLET | SUBLINGUAL | Status: AC | PRN

## 2011-12-12 MED ORDER — PEG-KCL-NACL-NASULF-NA ASC-C 100 G PO SOLR: 1.0000 | Freq: Once | ORAL | Status: DC

## 2011-12-12 MED ORDER — POLYETHYLENE GLYCOL 3350 17 G PO PACK: 17.0000 g | PACK | Freq: Every day | ORAL | Status: AC

## 2011-12-12 MED ORDER — ESOMEPRAZOLE MAGNESIUM 20 MG PO CPDR: 20.0000 mg | DELAYED_RELEASE_CAPSULE | Freq: Every day | ORAL | Status: DC

## 2011-12-12 NOTE — Telephone Encounter (Signed)
Message copied by Florene Glen on Tue Dec 12, 2011  3:17 PM ------      Message from: Beverley Fiedler      Created: Tue Dec 12, 2011  1:20 PM       Thyroid normal.

## 2011-12-12 NOTE — Telephone Encounter (Signed)
Notified pt that Dr Rhea Belton states her thyroid is normal; pt stated understanding.

## 2011-12-12 NOTE — Patient Instructions (Addendum)
You have been given a separate informational sheet regarding your tobacco use, the importance of quitting and local resources to help you quit.  You have been scheduled for a colonoscopy/Endoscopy. Please follow written instructions given to you at your visit today.  Please pick up your prep kit at the pharmacy within the next 2-3 days. We have sent the following medications to your pharmacy for you to pick up at your convenience: Movieprep. levsin  You have been given samples of Nexium: take one tablet daily and Miralax: Take as directed  Your physician has requested that you go to the basement for the following lab work before leaving today:  TSH     .

## 2011-12-12 NOTE — Progress Notes (Signed)
Subjective:    Patient ID: Rebecca [name], female    DOB: 07/22/1971, 41 y.o.   MRN: [id number]  HPI Rebecca [name] is a 41 yo female with PMH of Vit D def, GERD, depression who is seen in consultation at the request of Elpidio Anis, PA-C for evaluation of abd pain, bloating.  The patient reports epigastric and left upper quadrant abdominal pain worsening over the last 2-3 months. She reports the pain is there on a daily basis but there are periods of excruciating pain. She reports the pain is sharp and other times gets her sensation of distention. She feels the need to burp frequently, and the sensation of belching would improve her pain but often does not. She reports some nausea associated with this pain but no vomiting. She does feel her pain is worse 30 minutes to one hour after eating. She reports her appetite is okay but she's been avoiding large meals for fear of the pain. It does not seem there is any certain food that triggers her pain. She does report some trouble with heartburn, no dysphagia or odynophagia. She has tried Pepto-Bismol for this pain which helps occasionally. She feels her weight is down slightly, but she also thinks this is intentional. She reports ongoing trouble with constipation. She reports this was an abrupt change to her bowel habits about 6 months ago. Prior to that she had no trouble with constipation, reportedly having never needed to use laxatives.  Over the past 6 months she reports approximately one bowel movement per week, and recently she as needed manual disimpaction to help her move her bowels. She denies blood in her stool or melena. She does occasionally strain at stool. In the past she has used enemas to help with her bowel movements. She notes no specific change to her medication regimen around the time that her constipation trouble started. She previously took Cymbalta but no longer does.  She has tried multiple laxatives in the past including MiraLAX 17 g daily and  enemas. No f/c/ns.  No pain associated with defecation.  Review of Systems Constitutional: Negative for fever, chills, night sweats, activity change, appetite change and unexpected weight change HEENT: Positive for sore throat, negative for mouth sores and trouble swallowing. Eyes: Negative for visual disturbance Respiratory: Negative for cough, chest tightness and shortness of breath Cardiovascular: Negative for chest pain, palpitations and lower extremity swelling Gastrointestinal: See history of present illness Genitourinary: Negative for dysuria and hematuria. Musculoskeletal: Positive for history of back pain, negative for other arthralgias and myalgias Skin: Negative for rash or color change Neurological: Negative for headaches, weakness, numbness Hematological: Negative for adenopathy, negative for easy bruising/bleeding Psychiatric/behavioral: Positive for depressed mood, positive for anxiety, positive for sleeping difficulty   Past Medical History  Diagnosis Date  . 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 Anxiety   . PUD (peptic ulcer disease)   . Hiatal hernia   . Duodenitis   . Esophagitis   . Vitamin D deficiency   . Fatigue   . Sacroiliac pain   . Depression   . Anemia   . GERD (gastroesophageal reflux disease)   . Obesity   . Gallstones    Current Outpatient Prescriptions  Medication Sig Dispense Refill  . B Complex Vitamins (VITAMIN B COMPLEX PO) Take 1 tablet by mouth daily.        . Cholecalciferol (VITAMIN D3) 50000 UNITS CAPS Take 1 capsule by mouth 2 (two) times daily.        Marland Kitchen  Multiple Vitamins-Minerals (MULTIVITAMINS THER. W/MINERALS) TABS Take 1 tablet by mouth daily.         No Known Allergies   Social History  . Marital Status: Married    Number of Children: 2   Occupational History  . [employer]   Social History Main Topics  . Smoking status: Current Some Day Smoker  . Smokeless tobacco: Never Used  . Alcohol Use: No  .  Drug Use: No   Family History  Problem Relation Age of Onset  . Aneurysm Mother   . Heart disease Mother   . Diabetes Maternal Aunt   No known CRC    Objective:   Physical Exam BP 124/76  Pulse 100  Ht 5\' 6"  (1.676 m)  Wt 196 lb (88.905 kg)  BMI 31.64 kg/m2  LMP 12/09/2011 Constitutional: Well-developed and well-nourished. No distress. HEENT: Normocephalic and atraumatic. Oropharynx is clear and moist. No oropharyngeal exudate. Conjunctivae are normal. Pupils are equal round and reactive to light. No scleral icterus. Neck: Neck supple. Trachea midline. Cardiovascular: Normal rate, regular rhythm and intact distal pulses. No M/R/G Pulmonary/chest: Effort normal and breath sounds normal. No wheezing, rales or rhonchi. Abdominal: Soft, mild epigastric tenderness without rebound or guarding, nondistended. Bowel sounds active throughout. There are no masses palpable. No hepatosplenomegaly. Extremities: no clubbing, cyanosis, or edema Lymphadenopathy: No cervical adenopathy noted. Neurological: Alert and oriented to person place and time. Skin: Skin is warm and dry. No rashes noted. Psychiatric: Normal mood and affect. Behavior is normal.  CBC    Component Value Date/Time   WBC 5.2 11/18/2011 1230   RBC 4.24 11/18/2011 1230   HGB 12.7 11/18/2011 1230   HCT 37.7 11/18/2011 1230   PLT 284 11/18/2011 1230   MCV 88.9 11/18/2011 1230   MCH 30.0 11/18/2011 1230   MCHC 33.7 11/18/2011 1230   RDW 13.0 11/18/2011 1230   LYMPHSABS 2.5 11/18/2011 1230   MONOABS 0.6 11/18/2011 1230   EOSABS 0.1 11/18/2011 1230   BASOSABS 0.0 11/18/2011 1230    CMP     Component Value Date/Time   NA 138 11/18/2011 1230   K 3.9 11/18/2011 1230   CL 104 11/18/2011 1230   CO2 25 11/18/2011 1230   GLUCOSE 94 11/18/2011 1230   BUN 11 11/18/2011 1230   CREATININE 0.78 11/18/2011 1230   CALCIUM 9.6 11/18/2011 1230   PROT 7.2 11/18/2011 1230   ALBUMIN 3.8 11/18/2011 1230   AST 14 11/18/2011 1230    ALT 15 11/18/2011 1230   ALKPHOS 68 11/18/2011 1230   BILITOT 0.5 11/18/2011 1230   GFRNONAA >90 11/18/2011 1230   GFRAA >90 11/18/2011 1230    Lipase     Component Value Date/Time   LIPASE 44 11/18/2011 1230   Clinical Data: Hematuria, rule out stones  CT ABDOMEN AND PELVIS WITHOUT CONTRAST  11-18-11  Technique: Multidetector CT imaging of the abdomen and pelvis was  performed following the standard protocol without intravenous  contrast.  Comparison: CT thorax 01/12/2011  Findings: Lung bases are clear. No pericardial fluid. Non-IV  contrast images demonstrate no focal hepatic lesion. There is a  small 6 mm gallstone in the gallbladder. The pancreas, spleen,  adrenal glands, and adrenal glands are normal.  There is a 1 mm nonobstructing calculus within the left kidney. No  evidence of ureterolithiasis or obstructive uropathy. There  multiple vascular calcifications within the pelvis. Cannot exclude  distal ureteral stone  The stomach, small bowel, and colon are normal.  Abdominal aorta  is normal caliber. No retroperitoneal  lymphadenopathy.  No free fluid in the pelvis. Uterus and ovaries are normal. The  pelvic lymphadenopathy. Review of bone windows demonstrates no  aggressive osseous lesions.   IMPRESSION:  1. Left nephrolithiasis without evidence of obstructive uropathy.  2. Multiple vascular calcifications within the lower pelvis. No  convincing evidence of ureteral calculus.  3. Small gallstone without evidence of cholecystitis.   EGD - 2005 by Dr. Russella Dar -- esophagitis and duodenitis.  Small HH    Assessment & Plan:   41 yo female with PMH of Vit D def, GERD, depression who is seen in consultation at the request of Elpidio Anis, PA-C for evaluation of abd pain, bloating.  Also with constipation.  1. Abd pain/nausea/bloating -- the patient's constellation of symptoms are most consistent with ulcer-like dyspepsia, and given her history of duodenitis I recommended upper  endoscopy to rule out ongoing PUD.  I do feel she would benefit from acid suppression therapy, and I prescribed Nexium 40 mg daily. We discussed how best to take this, specifically 30 minutes to one hour before her first meal of the day. I will give her prescription for Levsin to be used as directed and as needed for pain/spasm. Her labs and CT scan were reassuring from a GI standpoint. She did have one small gallstone but no other changes concerning for cholecystitis. If her upper endoscopy is negative, we could further investigate her gallbladder. Also of note, if she has gastritis or duodenitis, we'll plan biopsy to exclude H. pylori. She should avoid NSAIDs.  2. Constipation -- interestingly, the patient does not have a long-standing history of constipation, but rather a more abrupt change in bowel habits approximately 6 months ago. She is now complaining of constipation and having trouble with defecation, to the point of needing to manually disimpact herself.  Given that this represents an abrupt change, I have recommended proceeding with colonoscopy to rule out an obstructing lesion. She is agreeable to proceed. I also would like to check a TSH today to ensure she has not developed hypothyroidism which could contribute to constipation. If her: Structurally normal, we could try medicines for chronic idiopathic constipation such as lubiprostone. Now I recommended that she take MiraLAX 17 g daily until the time of her colonoscopy.

## 2011-12-25 ENCOUNTER — Ambulatory Visit (AMBULATORY_SURGERY_CENTER): Payer: 59 | Admitting: Internal Medicine

## 2011-12-25 ENCOUNTER — Encounter: Payer: Self-pay | Admitting: Internal Medicine

## 2011-12-25 DIAGNOSIS — K635 Polyp of colon: Secondary | ICD-10-CM

## 2011-12-25 DIAGNOSIS — D126 Benign neoplasm of colon, unspecified: Secondary | ICD-10-CM

## 2011-12-25 DIAGNOSIS — R198 Other specified symptoms and signs involving the digestive system and abdomen: Secondary | ICD-10-CM

## 2011-12-25 DIAGNOSIS — D128 Benign neoplasm of rectum: Secondary | ICD-10-CM

## 2011-12-25 DIAGNOSIS — R1013 Epigastric pain: Secondary | ICD-10-CM

## 2011-12-25 DIAGNOSIS — K219 Gastro-esophageal reflux disease without esophagitis: Secondary | ICD-10-CM

## 2011-12-25 DIAGNOSIS — K3189 Other diseases of stomach and duodenum: Secondary | ICD-10-CM

## 2011-12-25 DIAGNOSIS — D129 Benign neoplasm of anus and anal canal: Secondary | ICD-10-CM

## 2011-12-25 HISTORY — DX: Polyp of colon: K63.5

## 2011-12-25 LAB — HM COLONOSCOPY

## 2011-12-25 MED ORDER — SODIUM CHLORIDE 0.9 % IV SOLN: 500.0000 mL | INTRAVENOUS | Status: DC

## 2011-12-25 NOTE — Op Note (Signed)
[facility] [address] [place], [place]  [address]  ENDOSCOPY PROCEDURE REPORT  PATIENT:  Rebecca, [name]  MR#:  [id number] BIRTHDATE:  1971-09-04, 40 yrs. old  GENDER:  female ENDOSCOPIST:  Carie Caddy. Kato Wieczorek, MD Referred by:  Elpidio Anis, PA-C PROCEDURE DATE:  12/25/2011 PROCEDURE:  EGD with biopsy, 19147 ASA CLASS:  Class II INDICATIONS:  dyspepsia MEDICATIONS:   MAC sedation, administered by CRNA, propofol (Diprivan) 250 mg IV TOPICAL ANESTHETIC:  none  DESCRIPTION OF PROCEDURE:   After the risks benefits and alternatives of the procedure were thoroughly explained, informed consent was obtained.  The [facility] [id number]-H180 [id number] endoscope was introduced through the mouth and advanced to the second portion of the duodenum, without limitations.  The instrument was slowly withdrawn as the mucosa was fully examined. <<PROCEDUREIMAGES>>  An isolated area of nodular mucosa was found in the proximal esophagus, at 19 cm from the incisors. Multiple biopsies were obtained and sent to pathology.  Otherwise normal esophagus.  The stomach was entered and closely examined. The antrum, angularis, and lesser curvature were well visualized, including a retroflexed view of the cardia and fundus. The stomach wall was normally distensable.  Random biopsies were obtained and sent to pathology. The scope passed easily through the pylorus into the duodenum. The duodenal bulb was normal in appearance, as was the postbulbar duodenum.    Retroflexed views revealed a hiatal hernia.    The scope was then withdrawn from the patient and the procedure completed.  COMPLICATIONS:  None  ENDOSCOPIC IMPRESSION: 1) Nodular mucosa in the proximal esophagus.   Biopsies were obtained and sent to pathology. 2) Otherwise normal esophagus 3) A hiatal hernia 4) Normal stomach in the total stomach.   Random biopsies were obtained and sent to pathology to rule out h. pylori. 5) Normal  duodenum  RECOMMENDATIONS: 1) Await pathology results 2) Continue current medications 3) Follow-up of helicobacter pylori status, treat if indicated 4) Office follow-up in 4-6 weeks.  Carie Caddy. Rhea Belton, MD  CC:  Elpidio Anis PA-C The Patient  n. eSIGNEDCarie Caddy. Lillee Mooneyhan at 12/25/2011 11:37 AM  Jessie Foot, [phone]

## 2011-12-25 NOTE — Op Note (Signed)
[facility] [address] [place], [place]  [address]  COLONOSCOPY PROCEDURE REPORT  PATIENT:  Rebecca, [name]  MR#:  [id number] BIRTHDATE:  August 11, 1971, 40 yrs. old  GENDER:  female ENDOSCOPIST:  Carie Caddy. Tailer Volkert, MD REF. BY:  Elpidio Anis, PA-C PROCEDURE DATE:  12/25/2011 PROCEDURE:  Colonoscopy with snare polypectomy ASA CLASS:  Class II INDICATIONS:  change in bowel habits MEDICATIONS:   MAC sedation, administered by CRNA, propofol (Diprivan) 300 mg IV  DESCRIPTION OF PROCEDURE:   After the risks benefits and alternatives of the procedure were thoroughly explained, informed consent was obtained.  Digital rectal exam was performed and revealed no rectal masses.   The [facility] [id number] endoscope was introduced through the anus and advanced to the terminal ileum which was intubated for a short distance, without limitations. The quality of the prep was good, using MoviPrep.  The instrument was then slowly withdrawn as the colon was fully examined. <<PROCEDUREIMAGES>>  FINDINGS:  The terminal ileum appeared normal.  A 7 mm pedunculated polyp was found in the rectosigmoid colon. Polyp was snared, then cauterized with monopolar cautery. Retrieval was successful.  Small internal Hemorrhoids were found.   Retroflexed views in the rectum revealed no other findings other than those already described.  The scope was then withdrawn from the cecum and the procedure completed.  COMPLICATIONS:  None ENDOSCOPIC IMPRESSION: 1) Normal terminal ileum 2) Pedunculated polyp in rectosigmoid colon.  Removed and sent to pathology. 3) Small internal hemorrhoids  RECOMMENDATIONS: 1) Await pathology results 2) If the polyp removed today are proven to be adenomatous (pre-cancerous) polyp, you will need a repeat colonoscopy in 5 years. Otherwise you should continue to follow colorectal cancer screening guidelines for "routine risk" patients with colonoscopy in 10 years. 3) You will  receive a letter within 1-2 weeks with the results of your biopsy as well as final recommendations. Please call my office if you have not received a letter after 3 weeks.  Carie Caddy. Rhea Belton, MD  CC:  The Patient Elpidio Anis PA-C  n. eSIGNED:   Carie Caddy. Annette Liotta at 12/25/2011 11:28 AM  Jessie Foot, [phone]

## 2011-12-25 NOTE — Patient Instructions (Signed)
FOLLOW DISCHARGE INSTRUCTIONS (BLUE AND GREEN SHEETS).Marland Kitchen CALL FOR FOLLOW UP APP. WITH DR. PYTLE FOR  4-6 WEEKS.

## 2011-12-25 NOTE — Progress Notes (Signed)
Patient did not experience any of the following events: a burn prior to discharge; a fall within the facility; wrong site/side/patient/procedure/implant event; or a hospital transfer or hospital admission upon discharge from the facility. (G8907) Patient did not have preoperative order for IV antibiotic SSI prophylaxis. ([id number])  

## 2011-12-26 ENCOUNTER — Telehealth: Payer: Self-pay | Admitting: *Deleted

## 2011-12-26 NOTE — Telephone Encounter (Signed)
  Follow up Call-  Call back number 12/25/2011  Post procedure Call Back phone  # [phone] work  Permission to leave phone message Yes     Patient questions:  Do you have a fever, pain , or abdominal swelling? no Pain Score  0 *  Have you tolerated food without any problems? yes  Have you been able to return to your normal activities? yes  Do you have any questions about your discharge instructions: Diet   no Medications  no Follow up visit  no  Do you have questions or concerns about your Care? no  Actions: * If pain score is 4 or above: No action needed, pain <4.

## 2012-01-01 ENCOUNTER — Encounter: Payer: Self-pay | Admitting: Internal Medicine

## 2012-01-09 ENCOUNTER — Telehealth: Payer: Self-pay | Admitting: Internal Medicine

## 2012-01-09 DIAGNOSIS — R194 Change in bowel habit: Secondary | ICD-10-CM

## 2012-01-09 DIAGNOSIS — R1013 Epigastric pain: Secondary | ICD-10-CM

## 2012-01-09 MED ORDER — ESOMEPRAZOLE MAGNESIUM 40 MG PO CPDR: DELAYED_RELEASE_CAPSULE | ORAL | Status: DC

## 2012-01-09 NOTE — Telephone Encounter (Signed)
I cannot find out why pt was scheduled for a B12 injection- we have never done a B12 level and I can't find one from another doc in her chart; appt cancelled. Pt requested Nexium be ordered- done at [facility] out pt pharmacy.

## 2012-01-11 ENCOUNTER — Encounter: Payer: Self-pay | Admitting: Internal Medicine

## 2012-01-11 ENCOUNTER — Telehealth: Payer: Self-pay | Admitting: Internal Medicine

## 2012-01-11 NOTE — Telephone Encounter (Signed)
Pt reports n/v after she eats. She reports the problem was better after her procedure, but has gotten worse. appt r/s to 01/15/12.

## 2012-01-13 ENCOUNTER — Other Ambulatory Visit: Payer: Self-pay

## 2012-01-13 ENCOUNTER — Emergency Department (HOSPITAL_COMMUNITY): Payer: 59

## 2012-01-13 ENCOUNTER — Emergency Department (HOSPITAL_COMMUNITY)
Admission: EM | Admit: 2012-01-13 | Discharge: 2012-01-13 | Disposition: A | Discharge status: Home / Self Care | Payer: 59 | Attending: Emergency Medicine | Admitting: Emergency Medicine

## 2012-01-13 ENCOUNTER — Encounter (HOSPITAL_COMMUNITY): Payer: Self-pay | Admitting: *Deleted

## 2012-01-13 DIAGNOSIS — F411 Generalized anxiety disorder: Secondary | ICD-10-CM | POA: Insufficient documentation

## 2012-01-13 DIAGNOSIS — Z86711 Personal history of pulmonary embolism: Secondary | ICD-10-CM | POA: Insufficient documentation

## 2012-01-13 DIAGNOSIS — R112 Nausea with vomiting, unspecified: Secondary | ICD-10-CM | POA: Insufficient documentation

## 2012-01-13 DIAGNOSIS — F17200 Nicotine dependence, unspecified, uncomplicated: Secondary | ICD-10-CM | POA: Insufficient documentation

## 2012-01-13 DIAGNOSIS — J069 Acute upper respiratory infection, unspecified: Secondary | ICD-10-CM | POA: Insufficient documentation

## 2012-01-13 DIAGNOSIS — R05 Cough: Secondary | ICD-10-CM | POA: Insufficient documentation

## 2012-01-13 DIAGNOSIS — R079 Chest pain, unspecified: Secondary | ICD-10-CM | POA: Insufficient documentation

## 2012-01-13 DIAGNOSIS — K449 Diaphragmatic hernia without obstruction or gangrene: Secondary | ICD-10-CM | POA: Insufficient documentation

## 2012-01-13 DIAGNOSIS — Z86718 Personal history of other venous thrombosis and embolism: Secondary | ICD-10-CM | POA: Insufficient documentation

## 2012-01-13 DIAGNOSIS — Z79899 Other long term (current) drug therapy: Secondary | ICD-10-CM | POA: Insufficient documentation

## 2012-01-13 DIAGNOSIS — E669 Obesity, unspecified: Secondary | ICD-10-CM | POA: Insufficient documentation

## 2012-01-13 DIAGNOSIS — M545 Low back pain, unspecified: Secondary | ICD-10-CM | POA: Insufficient documentation

## 2012-01-13 DIAGNOSIS — F3289 Other specified depressive episodes: Secondary | ICD-10-CM | POA: Insufficient documentation

## 2012-01-13 DIAGNOSIS — K219 Gastro-esophageal reflux disease without esophagitis: Secondary | ICD-10-CM | POA: Insufficient documentation

## 2012-01-13 DIAGNOSIS — R059 Cough, unspecified: Secondary | ICD-10-CM | POA: Insufficient documentation

## 2012-01-13 DIAGNOSIS — F329 Major depressive disorder, single episode, unspecified: Secondary | ICD-10-CM | POA: Insufficient documentation

## 2012-01-13 DIAGNOSIS — R1013 Epigastric pain: Secondary | ICD-10-CM | POA: Insufficient documentation

## 2012-01-13 DIAGNOSIS — Z8711 Personal history of peptic ulcer disease: Secondary | ICD-10-CM | POA: Insufficient documentation

## 2012-01-13 DIAGNOSIS — R6883 Chills (without fever): Secondary | ICD-10-CM | POA: Insufficient documentation

## 2012-01-13 LAB — DIFFERENTIAL
Basophils Relative: 1 % (ref 0–1)
Eosinophils Absolute: 0 10*3/uL (ref 0.0–0.7)
Eosinophils Relative: 1 % (ref 0–5)
Lymphs Abs: 0.9 10*3/uL (ref 0.7–4.0)
Neutrophils Relative %: 52 % (ref 43–77)

## 2012-01-13 LAB — BASIC METABOLIC PANEL
BUN: 9 mg/dL (ref 6–23)
Calcium: 9.3 mg/dL (ref 8.4–10.5)
GFR calc Af Amer: >90 mL/min (ref >90)
GFR calc non Af Amer: 90 mL/min — ABNORMAL LOW (ref >90)
Glucose, Bld: 83 mg/dL (ref 70–99)
Potassium: 3.9 mEq/L (ref 3.5–5.1)
Sodium: 139 mEq/L (ref 135–145)

## 2012-01-13 LAB — CBC
MCH: 29.9 pg (ref 26.0–34.0)
MCHC: 33.7 g/dL (ref 30.0–36.0)
MCV: 88.7 fL (ref 78.0–100.0)
Platelets: 253 10*3/uL (ref 150–400)
RBC: 3.88 MIL/uL (ref 3.87–5.11)

## 2012-01-13 LAB — URINALYSIS, ROUTINE W REFLEX MICROSCOPIC
Glucose, UA: NEGATIVE mg/dL
Hgb urine dipstick: NEGATIVE
Ketones, ur: NEGATIVE mg/dL
Protein, ur: NEGATIVE mg/dL
Urobilinogen, UA: 0.2 mg/dL (ref 0.0–1.0)

## 2012-01-13 MED ORDER — ALBUTEROL SULFATE HFA 108 (90 BASE) MCG/ACT IN AERS: 1.0000 | INHALATION_SPRAY | Freq: Four times a day (QID) | RESPIRATORY_TRACT | Status: DC | PRN

## 2012-01-13 MED ORDER — GI COCKTAIL ~~LOC~~
30.0000 mL | Freq: Once | ORAL | Status: AC
  Administered 2012-01-13: 30 mL via ORAL
  Filled 2012-01-13: qty 30

## 2012-01-13 MED ORDER — MORPHINE SULFATE 4 MG/ML IJ SOLN
4.0000 mg | Freq: Once | INTRAMUSCULAR | Status: AC
  Administered 2012-01-13: 4 mg via INTRAVENOUS
  Filled 2012-01-13: qty 1

## 2012-01-13 MED ORDER — IOHEXOL 300 MG/ML  SOLN
100.0000 mL | Freq: Once | INTRAMUSCULAR | Status: AC | PRN
  Administered 2012-01-13: 100 mL via INTRAVENOUS

## 2012-01-13 MED ORDER — ONDANSETRON HCL 4 MG/2ML IJ SOLN
4.0000 mg | Freq: Once | INTRAMUSCULAR | Status: AC
  Administered 2012-01-13: 4 mg via INTRAVENOUS
  Filled 2012-01-13: qty 2

## 2012-01-13 MED ORDER — HYDROCOD POLST-CHLORPHEN POLST 10-8 MG/5ML PO LQCR: 5.0000 mL | Freq: Every evening | ORAL | Status: DC | PRN

## 2012-01-13 NOTE — ED Notes (Signed)
Pt in c/o cough and congestion since yesterday with body aches, chest pain with coughing, increased coughing and shortness of breath this morning

## 2012-01-13 NOTE — Discharge Instructions (Signed)
Please follow up with your gastroenterologist on Monday for further management of your symptoms.  You may return to the ER at any time for worsening condition or any new symptoms that concern you.    Diet for GERD or PUD Nutrition therapy can help ease the discomfort of gastroesophageal reflux disease (GERD) and peptic ulcer disease (PUD).  HOME CARE INSTRUCTIONS   Eat your meals slowly, in a relaxed setting.   Eat 5 to 6 small meals per day.   If a food causes distress, stop eating it for a period of time.  FOODS TO AVOID  Coffee, regular or decaffeinated.   Cola beverages, regular or low calorie.   Tea, regular or decaffeinated.   Pepper.   Cocoa.   High fat foods, including meats.   Butter, margarine, hydrogenated oil (trans fats).   Peppermint or spearmint (if you have GERD).   Fruits and vegetables if not tolerated.   Alcohol.   Nicotine (smoking or chewing). This is one of the most potent stimulants to acid production in the gastrointestinal tract.   Any food that seems to aggravate your condition.  If you have questions regarding your diet, ask your caregiver or a registered dietitian. TIPS  Lying flat may make symptoms worse. Keep the head of your bed raised 6 to 9 inches (15 to 23 cm) by using a foam wedge or blocks under the legs of the bed.   Do not lay down until 3 hours after eating a meal.   Daily physical activity may help reduce symptoms.  MAKE SURE YOU:   Understand these instructions.   Will watch your condition.   Will get help right away if you are not doing well or get worse.  Document Released: 11/13/2005 Document Revised: 07/26/2011 Document Reviewed: 03/29/2009 [facility] Patient Information 2012 [place], [place].Gastroesophageal Reflux Disease, Adult Gastroesophageal reflux disease (GERD) happens when acid from your stomach flows up into the esophagus. When acid comes in contact with the esophagus, the acid causes soreness (inflammation) in  the esophagus. Over time, GERD may create small holes (ulcers) in the lining of the esophagus. CAUSES   Increased body weight. This puts pressure on the stomach, making acid rise from the stomach into the esophagus.   Smoking. This increases acid production in the stomach.   Drinking alcohol. This causes decreased pressure in the lower esophageal sphincter (valve or ring of muscle between the esophagus and stomach), allowing acid from the stomach into the esophagus.   Late evening meals and a full stomach. This increases pressure and acid production in the stomach.   A malformed lower esophageal sphincter.  Sometimes, no cause is found. SYMPTOMS   Burning pain in the lower part of the mid-chest behind the breastbone and in the mid-stomach area. This may occur twice a week or more often.   Trouble swallowing.   Sore throat.   Dry cough.   Asthma-like symptoms including chest tightness, shortness of breath, or wheezing.  DIAGNOSIS  Your caregiver may be able to diagnose GERD based on your symptoms. In some cases, X-rays and other tests may be done to check for complications or to check the condition of your stomach and esophagus. TREATMENT  Your caregiver may recommend over-the-counter or prescription medicines to help decrease acid production. Ask your caregiver before starting or adding any new medicines.  HOME CARE INSTRUCTIONS   Change the factors that you can control. Ask your caregiver for guidance concerning weight loss, quitting smoking, and alcohol consumption.  Avoid foods and drinks that make your symptoms worse, such as:   Caffeine or alcoholic drinks.   Chocolate.   Peppermint or mint flavorings.   Garlic and onions.   Spicy foods.   Citrus fruits, such as oranges, lemons, or limes.   Tomato-based foods such as sauce, chili, salsa, and pizza.   Fried and fatty foods.   Avoid lying down for the 3 hours prior to your bedtime or prior to taking a nap.    Eat small, frequent meals instead of large meals.   Wear loose-fitting clothing. Do not wear anything tight around your waist that causes pressure on your stomach.   Raise the head of your bed 6 to 8 inches with wood blocks to help you sleep. Extra pillows will not help.   Only take over-the-counter or prescription medicines for pain, discomfort, or fever as directed by your caregiver.   Do not take aspirin, ibuprofen, or other nonsteroidal anti-inflammatory drugs (NSAIDs).  SEEK IMMEDIATE MEDICAL CARE IF:   You have pain in your arms, neck, jaw, teeth, or back.   Your pain increases or changes in intensity or duration.   You develop nausea, vomiting, or sweating (diaphoresis).   You develop shortness of breath, or you faint.   Your vomit is green, yellow, black, or looks like coffee grounds or blood.   Your stool is red, bloody, or black.  These symptoms could be signs of other problems, such as heart disease, gastric bleeding, or esophageal bleeding. MAKE SURE YOU:   Understand these instructions.   Will watch your condition.   Will get help right away if you are not doing well or get worse.  Document Released: 08/23/2005 Document Revised: 07/26/2011 Document Reviewed: 06/02/2011 [facility] Patient Information 2012 [place], [place].Hiatal Hernia A hiatal hernia occurs when a part of the stomach slides above the diaphragm. The diaphragm is the thin muscle separating the belly (abdomen) from the chest. A hiatal hernia can be something you are born with or develop over time. Hiatal hernias may allow stomach acid to flow back into your esophagus, the tube which carries food from your mouth to your stomach. If this acid causes problems it is called GERD (gastro-esophageal reflux disease).  SYMPTOMS  Common symptoms of GERD are heartburn (burning in your chest). This is worse when lying down or bending over. It may also cause belching and indigestion. Some of the things which make GERD  worse are:  Increased weight pushes on stomach making acid rise more easily.   Smoking markedly increases acid production.   Alcohol decreases lower esophageal sphincter pressure (valve between stomach and esophagus), allowing acid from stomach into esophagus.   Late evening meals and going to bed with a full stomach increases pressure.   Anything that causes an increase in acid production.   Lower esophageal sphincter incompetence.  DIAGNOSIS  Hiatal hernia is often diagnosed with x-rays of your stomach and small bowel. This is called an UGI (upper gastrointestinal x-ray). Sometimes a gastroscopic procedure is done. This is a procedure where your caregiver uses a flexible instrument to look into the stomach and small bowel. HOME CARE INSTRUCTIONS   Try to achieve and maintain an ideal body weight.   Avoid drinking alcoholic beverages.   Stop smoking.   Put the head of your bed on 4 to 6 inch blocks. This will keep your head and esophagus higher than your stomach. If you cannot use blocks, sleep with several pillows under your head and shoulders.  Over-the-counter medications will decrease acid production. Your caregiver can also prescribe medications for this. Take as directed.   1/2 to 1 teaspoon of an antacid taken every hour while awake, with meals and at bedtime, will neutralize acid.   Do not take aspirin, ibuprofen (Advil or Motrin), or other nonsteroidal anti-inflammatory drugs.   Do not wear tight clothing around your chest or stomach.   Eat smaller meals and eat more frequently. This keeps your stomach from getting too full. Eat slowly.   Do not lie down for 2 or 3 hours after eating. Do not eat or drink anything 1 to 2 hours before going to bed.   Avoid caffeine beverages (colas, coffee, cocoa, tea), fatty foods, citrus fruits and all other foods and drinks that contain acid and that seem to increase the problems.   Avoid bending over, especially after eating.  Also avoid straining during bowel movements or when urinating or lifting things. Anything that increases the pressure in your belly increases the amount of acid that may be pushed up into your esophagus.  SEEK IMMEDIATE MEDICAL CARE IF:  There is change in location (pain in arms, neck, jaw, teeth or back) of your pain, or the pain is getting worse.   You also experience nausea, vomiting, sweating (diaphoresis), or shortness of breath.   You develop continual vomiting, vomit blood or coffee ground material, have bright red blood in your stools, or have black tarry stools.  Some of these symptoms could signal other problems such as heart disease. MAKE SURE YOU:   Understand these instructions.   Monitor your condition.   Contact your caregiver if you are not doing well or are getting worse.  Document Released: 02/03/2004 Document Revised: 07/26/2011 Document Reviewed: 11/13/2005 [facility] Patient Information 2012 [place], [place].Upper Respiratory Infection, Adult An upper respiratory infection (URI) is also sometimes known as the common cold. The upper respiratory tract includes the nose, sinuses, throat, trachea, and bronchi. Bronchi are the airways leading to the lungs. Most people improve within 1 week, but symptoms can last up to 2 weeks. A residual cough may last even longer.  CAUSES Many different viruses can infect the tissues lining the upper respiratory tract. The tissues become irritated and inflamed and often become very moist. Mucus production is also common. A cold is contagious. You can easily spread the virus to others by oral contact. This includes kissing, sharing a glass, coughing, or sneezing. Touching your mouth or nose and then touching a surface, which is then touched by another person, can also spread the virus. SYMPTOMS  Symptoms typically develop 1 to 3 days after you come in contact with a cold virus. Symptoms vary from person to person. They may include:  Runny nose.     Sneezing.   Nasal congestion.   Sinus irritation.   Sore throat.   Loss of voice (laryngitis).   Cough.   Fatigue.   Muscle aches.   Loss of appetite.   Headache.   Low-grade fever.  DIAGNOSIS  You might diagnose your own cold based on familiar symptoms, since most people get a cold 2 to 3 times a year. Your caregiver can confirm this based on your exam. Most importantly, your caregiver can check that your symptoms are not due to another disease such as strep throat, sinusitis, pneumonia, asthma, or epiglottitis. Blood tests, throat tests, and X-rays are not necessary to diagnose a common cold, but they may sometimes be helpful in excluding other more serious diseases. Your caregiver  will decide if any further tests are required. RISKS AND COMPLICATIONS  You may be at risk for a more severe case of the common cold if you smoke cigarettes, have chronic heart disease (such as heart failure) or lung disease (such as asthma), or if you have a weakened immune system. The very young and very old are also at risk for more serious infections. Bacterial sinusitis, middle ear infections, and bacterial pneumonia can complicate the common cold. The common cold can worsen asthma and chronic obstructive pulmonary disease (COPD). Sometimes, these complications can require emergency medical care and may be life-threatening. PREVENTION  The best way to protect against getting a cold is to practice good hygiene. Avoid oral or hand contact with people with cold symptoms. Wash your hands often if contact occurs. There is no clear evidence that vitamin C, vitamin E, echinacea, or exercise reduces the chance of developing a cold. However, it is always recommended to get plenty of rest and practice good nutrition. TREATMENT  Treatment is directed at relieving symptoms. There is no cure. Antibiotics are not effective, because the infection is caused by a virus, not by bacteria. Treatment may  include:  Increased fluid intake. Sports drinks offer valuable electrolytes, sugars, and fluids.   Breathing heated mist or steam (vaporizer or shower).   Eating chicken soup or other clear broths, and maintaining good nutrition.   Getting plenty of rest.   Using gargles or lozenges for comfort.   Controlling fevers with ibuprofen or acetaminophen as directed by your caregiver.   Increasing usage of your inhaler if you have asthma.  Zinc gel and zinc lozenges, taken in the first 24 hours of the common cold, can shorten the duration and lessen the severity of symptoms. Pain medicines may help with fever, muscle aches, and throat pain. A variety of non-prescription medicines are available to treat congestion and runny nose. Your caregiver can make recommendations and may suggest nasal or lung inhalers for other symptoms.  HOME CARE INSTRUCTIONS   Only take over-the-counter or prescription medicines for pain, discomfort, or fever as directed by your caregiver.   Use a warm mist humidifier or inhale steam from a shower to increase air moisture. This may keep secretions moist and make it easier to breathe.   Drink enough water and fluids to keep your urine clear or pale yellow.   Rest as needed.   Return to work when your temperature has returned to normal or as your caregiver advises. You may need to stay home longer to avoid infecting others. You can also use a face mask and careful hand washing to prevent spread of the virus.  SEEK MEDICAL CARE IF:   After the first few days, you feel you are getting worse rather than better.   You need your caregiver's advice about medicines to control symptoms.   You develop chills, worsening shortness of breath, or brown or red sputum. These may be signs of pneumonia.   You develop yellow or brown nasal discharge or pain in the face, especially when you bend forward. These may be signs of sinusitis.   You develop a fever, swollen neck glands,  pain with swallowing, or white areas in the back of your throat. These may be signs of strep throat.  SEEK IMMEDIATE MEDICAL CARE IF:   You have a fever.   You develop severe or persistent headache, ear pain, sinus pain, or chest pain.   You develop wheezing, a prolonged cough, cough up blood, or  have a change in your usual mucus (if you have chronic lung disease).   You develop sore muscles or a stiff neck.  Document Released: 05/09/2001 Document Revised: 07/26/2011 Document Reviewed: 03/17/2011 [facility] Patient Information 2012 [place], [place].Antibiotic Nonuse  Your caregiver felt that the infection or problem was not one that would be helped with an antibiotic. Infections may be caused by viruses or bacteria. Only a caregiver can tell which one of these is the likely cause of an illness. A cold is the most common cause of infection in both adults and children. A cold is a virus. Antibiotic treatment will have no effect on a viral infection. Viruses can lead to many lost days of work caring for sick children and many missed days of school. Children may catch as many as 10 "colds" or "flus" per year during which they can be tearful, cranky, and uncomfortable. The goal of treating a virus is aimed at keeping the ill person comfortable. Antibiotics are medications used to help the body fight bacterial infections. There are relatively few types of bacteria that cause infections but there are hundreds of viruses. While both viruses and bacteria cause infection they are very different types of germs. A viral infection will typically go away by itself within 7 to 10 days. Bacterial infections may spread or get worse without antibiotic treatment. Examples of bacterial infections are:  Sore throats (like strep throat or tonsillitis).   Infection in the lung (pneumonia).   Ear and skin infections.  Examples of viral infections are:  Colds or flus.   Most coughs and bronchitis.   Sore throats not  caused by Strep.   Runny noses.  It is often best not to take an antibiotic when a viral infection is the cause of the problem. Antibiotics can kill off the helpful bacteria that we have inside our body and allow harmful bacteria to start growing. Antibiotics can cause side effects such as allergies, nausea, and diarrhea without helping to improve the symptoms of the viral infection. Additionally, repeated uses of antibiotics can cause bacteria inside of our body to become resistant. That resistance can be passed onto harmful bacterial. The next time you have an infection it may be harder to treat if antibiotics are used when they are not needed. Not treating with antibiotics allows our own immune system to develop and take care of infections more efficiently. Also, antibiotics will work better for [place] when they are prescribed for bacterial infections. Treatments for a child that is ill may include:  Give extra fluids throughout the day to stay hydrated.   Get plenty of rest.   Only give your child over-the-counter or prescription medicines for pain, discomfort, or fever as directed by your caregiver.   The use of a cool mist humidifier may help stuffy noses.   Cold medications if suggested by your caregiver.  Your caregiver may decide to start you on an antibiotic if:  The problem you were seen for today continues for a longer length of time than expected.   You develop a secondary bacterial infection.  SEEK MEDICAL CARE IF:  Fever lasts longer than 5 days.   Symptoms continue to get worse after 5 to 7 days or become severe.   Difficulty in breathing develops.   Signs of dehydration develop (poor drinking, rare urinating, dark colored urine).   Changes in behavior or worsening tiredness (listlessness or lethargy).  Document Released: 01/22/2002 Document Revised: 07/26/2011 Document Reviewed: 07/21/2009 [facility] Patient Information 2012 [place]. 

## 2012-01-13 NOTE — ED Provider Notes (Signed)
History     CSN: [id number]  Arrival date & time 01/13/12  1007   First MD Initiated Contact with Patient 01/13/12 1014      Chief Complaint  Patient presents with  . Influenza    (Consider location/radiation/quality/duration/timing/severity/associated sxs/prior treatment) HPI Comments: Patient reports she has had constant dull pain over her sternal area x 1 week.  Is not sure if it was gradual or sudden in onset.  States the pain is not improved with ibuprofen or vics vapor rub.  Denies any exacerbating or palliative factors.  States that 3 days ago she began coughing, cough is productive of green sputum, chills, low back pain, urinary frequency.  One episode of N/V this morning.  LMP ended this week.  Patient also notes that she is anxious and stressed, does not want to discuss the reasons.  Denies SI.  Denies known fevers, SOB, pleuritic chest pain.   Pt has hx DVT, PE, PUD, GERD.    Patient is a 41 y.o. female presenting with flu symptoms. The history is provided by the patient.  Influenza Pertinent negatives include no sore throat.    Past Medical History  Diagnosis Date  . 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 Anxiety   . PUD (peptic ulcer disease)   . Hiatal hernia   . Duodenitis   . Esophagitis   . Vitamin d deficiency   . Fatigue   . Sacroiliac pain   . Depression   . Anemia   . GERD (gastroesophageal reflux disease)   . Obesity   . Gallstones     Past Surgical History  Procedure Date  . Tubal ligation   . Breast lumpectomy     benign    Family History  Problem Relation Age of Onset  . Aneurysm Mother   . Heart disease Mother   . Diabetes Maternal Aunt     History  Substance Use Topics  . Smoking status: Current Some Day Smoker -- 0.1 packs/day    Types: Cigarettes  . Smokeless tobacco: Never Used   Comment: smokes 3 ciggs a day  . Alcohol Use: No     occasional glass of wine    OB History    Grav Para Term Preterm Abortions TAB SAB Ect  Mult Living                  Review of Systems  HENT: Negative for sore throat and trouble swallowing.   Genitourinary: Negative for vaginal bleeding, vaginal discharge and menstrual problem.  All other systems reviewed and are negative.    Allergies  Review of patient's allergies indicates no known allergies.  Home Medications   Current Outpatient Rx  Name Route Sig Dispense Refill  . VITAMIN D3 50000 UNITS PO CAPS Oral Take 1 capsule by mouth 2 (two) times daily.      . DULOXETINE HCL 30 MG PO CPEP Oral Take 30 mg by mouth daily.    Marland Kitchen ESOMEPRAZOLE MAGNESIUM 40 MG PO CPDR  Take one capsule by mouth at least 30 minutes prior to breakfast daily. 30 capsule 6  . THERA M PLUS PO TABS Oral Take 1 tablet by mouth daily.        BP 125/91  Pulse 113  Temp(Src) 98.8 F (37.1 C) (Oral)  Resp 20  SpO2 99%  LMP 01/01/2012  Physical Exam  Nursing note and vitals reviewed. Constitutional: She is oriented to person, place, and time. She appears well-developed and well-nourished.  Pt is tearful  HENT:  Head: Normocephalic and atraumatic.  Neck: Neck supple.  Cardiovascular: Normal rate, regular rhythm and normal heart sounds.   Pulmonary/Chest: Breath sounds normal. No respiratory distress. She has no wheezes. She has no rales. She exhibits no tenderness.  Abdominal: Soft. Bowel sounds are normal. She exhibits no distension and no mass. There is tenderness in the epigastric area. There is no rigidity, no rebound, no guarding and negative Murphy's sign.       Mild epigastric/left epigastric pain  Neurological: She is alert and oriented to person, place, and time.    ED Course  Procedures (including critical care time)  Labs Reviewed  CBC - Abnormal; Notable for the following:    WBC 3.1 (*)    Hemoglobin 11.6 (*)    HCT 34.4 (*)    All other components within normal limits  DIFFERENTIAL - Abnormal; Notable for the following:    Neutro Abs 1.6 (*)    Monocytes Relative  17 (*)    All other components within normal limits  BASIC METABOLIC PANEL  D-DIMER, QUANTITATIVE  URINALYSIS, ROUTINE W REFLEX MICROSCOPIC  PREGNANCY, URINE   Dg Chest 2 View  01/13/2012  *RADIOLOGY REPORT*  Clinical Data: Cough and shortness of breath  CHEST - 2 VIEW  Comparison: Chest CT 01/12/2011 and chest radiograph 01/12/2011.  Findings: Normal heart and mediastinal contours.  Pulmonary vascularity is normal.  The lungs are normally expanded and clear. Negative for pleural effusion or pneumothorax.  No acute bony abnormality.  IMPRESSION: No acute cardiopulmonary disease.  Original Report Authenticated By: Britta Mccreedy, M.D.   Ct Angio Chest W/cm &/or Wo Cm  01/13/2012  *RADIOLOGY REPORT*  Clinical Data: Chest pain.  The epigastric pain.  CT ANGIOGRAPHY CHEST  Technique:  Multidetector CT imaging of the chest using the standard protocol during bolus administration of intravenous contrast. Multiplanar reconstructed images including MIPs were obtained and reviewed to evaluate the vascular anatomy.  Contrast: OMNIPAQUE IOHEXOL 300 MG/ML IV SOLN  Comparison: CT chest 01/12/2011 and chest radiograph 01/13/2012  Findings: Satisfactory evaluation of the pulmonary arterial tree. No focal filling defects are identified in the pulmonary arteries. Central pulmonary arteries are normal in size.  Heart size is normal.  There is no lymphadenopathy in the chest.  Thyroid gland is within normal limits.  The upper thoracic esophagus is slightly dilated and contains some fluid within it.  There is a small hiatal hernia.  There is no pleural or pericardial effusion.  There is some dependent atelectasis in both lung bases.  Otherwise, the lungs are clear. Soft tissues of the breasts and chest wall are unremarkable. There is a stable circumscribed low density area in the posterior aspect of the T8 vertebral body, likely a benign lesion such as hemangioma.  Imaged portion of the upper abdomen shows no acute or  suspicious findings.  IMPRESSION:  1.  Negative for pulmonary embolism. 2.  Small hiatal hernia.  The upper thoracic esophagus contains some fluid and is slightly distended.  Question if the patient could have gastroesophageal reflux.  Suggest correlation with history. 3.  No acute findings in the lungs.  Original Report Authenticated By: Britta Mccreedy, M.D.   Filed Vitals:   01/13/12 1456  BP: 121/59  Pulse: 83  Temp:   Resp: 16     Date: 01/13/2012   Rate: 85  Rhythm: normal sinus rhythm  QRS Axis: normal  Intervals: normal  ST/T Wave abnormalities: normal  Conduction Disutrbances: none  Narrative Interpretation:   Old EKG Reviewed: No significant changes noted     1. GERD (gastroesophageal reflux disease)   2. Hiatal hernia   3. URI (upper respiratory infection)       MDM  Nontoxic patient with 1 week of dull constant midsternal chest pain and mild epigastric tenderness, hx of GERD.  Pt also with apparent unrelated cough productive of green sputum, chills, nasal congestion.  Likely URI on top of discomfort of GERD.  Patient's chest pain is atypical, not exertional or pleuritic - doubt ACS.  Pt with hx of PE in the setting of pregnancy- d-dimer elevated, CT angio is negative.  Pt has GI follow up on Monday morning.  Pt d/c home with GI follow up.  Pt to continue taking Nexium.  D/C home with symptomatic treatment for URI.  Patient verbalizes understanding and agrees with plan.          Dillard Cannon [place], [place] 01/13/12 1530

## 2012-01-14 NOTE — ED Provider Notes (Signed)
Medical screening examination/treatment/procedure(s) were performed by non-physician practitioner and as supervising physician I was immediately available for consultation/collaboration.  Raeford Razor, MD 01/14/12 [phone]

## 2012-01-15 ENCOUNTER — Encounter: Payer: Self-pay | Admitting: Internal Medicine

## 2012-01-15 ENCOUNTER — Ambulatory Visit (INDEPENDENT_AMBULATORY_CARE_PROVIDER_SITE_OTHER): Payer: 59 | Admitting: Internal Medicine

## 2012-01-15 DIAGNOSIS — R110 Nausea: Secondary | ICD-10-CM

## 2012-01-15 DIAGNOSIS — K219 Gastro-esophageal reflux disease without esophagitis: Secondary | ICD-10-CM

## 2012-01-15 DIAGNOSIS — K3189 Other diseases of stomach and duodenum: Secondary | ICD-10-CM

## 2012-01-15 DIAGNOSIS — R1013 Epigastric pain: Secondary | ICD-10-CM

## 2012-01-15 DIAGNOSIS — K635 Polyp of colon: Secondary | ICD-10-CM | POA: Insufficient documentation

## 2012-01-15 DIAGNOSIS — D126 Benign neoplasm of colon, unspecified: Secondary | ICD-10-CM

## 2012-01-15 MED ORDER — ONDANSETRON HCL 4 MG PO TABS: 4.0000 mg | ORAL_TABLET | Freq: Every day | ORAL | Status: DC | PRN

## 2012-01-15 MED ORDER — RIFAXIMIN 550 MG PO TABS: 550.0000 mg | ORAL_TABLET | Freq: Two times a day (BID) | ORAL | Status: DC

## 2012-01-15 MED ORDER — DEXLANSOPRAZOLE 60 MG PO CPDR: 60.0000 mg | DELAYED_RELEASE_CAPSULE | Freq: Every day | ORAL | Status: DC

## 2012-01-15 NOTE — Patient Instructions (Addendum)
You have been given a separate informational sheet regarding your tobacco use, the importance of quitting and local resources to help you quit.  We have sent the following medications to your pharmacy for you to pick up at your convenience: Dexilant take one tablet daily.  Xiafaxin: ttake 1 tablet 3 times daily for 14 days.  Dr. Rhea Belton would like you to discontinue nexium.   Dr. Rhea Belton would like to see you back in the office 1 one month for a follow up

## 2012-01-15 NOTE — Progress Notes (Signed)
Subjective:    Patient ID: Rebecca [name], female    DOB: 1971-05-09, 41 y.o.   MRN: [id number]  HPI Rebecca [name] is a 41 yo female with PMH of Vit D def, GERD, depression who is seen in follow-up.  I first saw Rebecca [name] in January 2012 for evaluation of epigastric abdominal pain nausea with occasional vomiting.  She was started on Nexium 40 mg daily and underwent upper endoscopy which was paced unremarkable. Pathology was negative for H. pylori. She also underwent colonoscopy for change in bowel habits which revealed a tubular adenoma without high-grade dysplasia.  She returns today reporting continued issues with nausea as well as regurgitation of food and occasional vomiting. No hematemesis. She reports this occurs after eating usually 30 minutes or so. She denies heartburn and dysphagia. She does note some epigastric pain, but more a feeling of fullness than pain. Abdominal bloating continues to be an issue for her. She is not sure the Nexium has helped much at all other than leading to possibly decreased belching.  BMs are her about the same, but now occurring once per day. No blood or melena. No fevers or chills.  Review of Systems As per HPI, otherwise negative.  Patient Active Problem List  Diagnoses  . PALPITATIONS  . GERD (gastroesophageal reflux disease)  . Constipation  . Vitamin d deficiency  . History of depression  --Adenomatous colon polyps  Past Surgical History  Procedure Date  . Tubal ligation   . Breast lumpectomy     benign   Current Outpatient Prescriptions  Medication Sig Dispense Refill  . albuterol (PROVENTIL HFA;VENTOLIN HFA) 108 (90 BASE) MCG/ACT inhaler Inhale 1-2 puffs into the lungs every 6 (six) hours as needed for wheezing or shortness of breath (cough).  1 Inhaler  0  . chlorpheniramine-HYDROcodone (TUSSIONEX PENNKINETIC ER) 10-8 MG/5ML LQCR Take 5 mLs by mouth at bedtime as needed (cough, pain).  100 mL  0  . Cholecalciferol (VITAMIN D3) 50000 UNITS  CAPS Take 1 capsule by mouth 2 (two) times daily.        . DULoxetine (CYMBALTA) 30 MG capsule Take 30 mg by mouth daily.      . Multiple Vitamins-Minerals (MULTIVITAMINS THER. W/MINERALS) TABS Take 1 tablet by mouth daily.        Marland Kitchen dexlansoprazole (DEXILANT) 60 MG capsule Take 1 capsule (60 mg total) by mouth daily.  30 capsule  3  . ondansetron (ZOFRAN) 4 MG tablet Take 1 tablet (4 mg total) by mouth daily as needed for nausea.  30 tablet  1  . rifaximin (XIFAXAN) 550 MG TABS Take 1 tablet (550 mg total) by mouth 2 (two) times daily.  60 tablet  0   No Known Allergies  Family History  Problem Relation Age of Onset  . Aneurysm Mother   . Heart disease Mother   . Diabetes Maternal Aunt    SH - reviewed and no change     Objective:   Physical Exam BP 112/82  Pulse 88  Ht 5\' 7"  (1.702 m)  Wt 191 lb 12.8 oz (87 kg)  BMI 30.04 kg/m2  LMP 01/01/2012 Constitutional: Well-developed and well-nourished. No distress. HEENT: Normocephalic and atraumatic. Oropharynx is clear and moist. No oropharyngeal exudate. Conjunctivae are normal. Pupils are equal round and reactive to light. No scleral icterus. Neck: Neck supple. Trachea midline. Cardiovascular: Normal rate, regular rhythm and intact distal pulses. No M/R/G Pulmonary/chest: Effort normal and breath sounds normal. No wheezing, rales or rhonchi. Abdominal: Soft,  mild epigastric tenderness without rebound or guarding, nondistended. Bowel sounds active throughout. There are no masses palpable. No hepatosplenomegaly. Extremities: no clubbing, cyanosis, or edema Lymphadenopathy: No cervical adenopathy noted. Neurological: Alert and oriented to person place and time. Skin: Skin is warm and dry. No rashes noted. Psychiatric: Normal mood and affect. Behavior is normal.  Imaging Reviewed: CT ANGIOGRAPHY CHEST 01/13/12   Technique:  Multidetector CT imaging of the chest using the standard protocol during bolus administration of  intravenous contrast. Multiplanar reconstructed images including MIPs were obtained and reviewed to evaluate the vascular anatomy.   Contrast: OMNIPAQUE IOHEXOL 300 MG/ML IV SOLN   Comparison: CT chest 01/12/2011 and chest radiograph 01/13/2012   Findings: Satisfactory evaluation of the pulmonary arterial tree. No focal filling defects are identified in the pulmonary arteries. Central pulmonary arteries are normal in size.  Heart size is normal.  There is no lymphadenopathy in the chest.  Thyroid gland is within normal limits.   The upper thoracic esophagus is slightly dilated and contains some fluid within it.  There is a small hiatal hernia.   There is no pleural or pericardial effusion.  There is some dependent atelectasis in both lung bases.  Otherwise, the lungs are clear. Soft tissues of the breasts and chest wall are unremarkable. There is a stable circumscribed low density area in the posterior aspect of the T8 vertebral body, likely a benign lesion such as hemangioma.   Imaged portion of the upper abdomen shows no acute or suspicious findings.   IMPRESSION:   1.  Negative for pulmonary embolism. 2.  Small hiatal hernia.  The upper thoracic esophagus contains some fluid and is slightly distended.  Question if the patient could have gastroesophageal reflux.  Suggest correlation with history. 3.  No acute findings in the lungs.  CT ABDOMEN AND PELVIS WITHOUT CONTRAST 11/18/11   Technique:  Multidetector CT imaging of the abdomen and pelvis was performed following the standard protocol without intravenous contrast.   Comparison: CT thorax 01/12/2011   Findings: Lung bases are clear.  No pericardial fluid.  Non-IV contrast images demonstrate no focal hepatic lesion.  There is a small 6 mm gallstone in the gallbladder.  The pancreas, spleen, adrenal glands, and adrenal glands are normal.   There is a 1 mm nonobstructing calculus within the left kidney.   No evidence of ureterolithiasis or obstructive uropathy.  There multiple vascular calcifications within the pelvis.  Cannot exclude distal ureteral stone   The stomach, small bowel, and colon are normal.   Abdominal aorta is normal caliber.  No retroperitoneal lymphadenopathy.   No free fluid in the pelvis.  Uterus and ovaries are normal.  The pelvic lymphadenopathy. Review of  bone windows demonstrates no aggressive osseous lesions.   IMPRESSION:   1.  Left nephrolithiasis without evidence of obstructive uropathy. 2.  Multiple vascular calcifications within the lower pelvis.  No convincing evidence of ureteral calculus. 3.  Small gallstone without evidence of cholecystitis.     Assessment & Plan:  Rebecca [name] is a 41 year old female with a past medical history of GERD, vitamin D deficiency, depression, and now adenomatous colon polyp who presents for followup with ongoing nausea with occasional vomiting an overall sensation of fullness with bloating.  1. N/V/GERD/bloating -- the patient continues to have dyspeptic symptoms despite a reassuring upper endoscopy from last month. At this point, it is not seem she is benefiting much from Nexium 40 mg daily. I will give her a trial of another  PPI to see if this is more beneficial. I recommended Dexilant 60 mg daily.  She is instructed to take this 30 minutes to one hour before her first meal of the day. Given her nausea and bloating, it raises the question of small bowel bacterial overgrowth or gastroparesis. Gallbladder dysfunction is also lower on the differential, but her pain and symptoms are not completely consistent with this diagnosis.  I will give her a trial of rifaximin 550 mg 3 times a day for 14 days, empirically for SIBO.  I also will give her Zofran 4 mg ODT every 8 hours when necessary for nausea.  I'll see her back in one month's time to reassess her symptoms. His symptoms resist other considerations include GB US, CCK-HIDA,  and/or gastric emptying study.  2. history of colon polyps -- she'll be due repeat colonoscopy in 5 years, January 2018

## 2012-01-19 ENCOUNTER — Ambulatory Visit: Payer: 59 | Admitting: Internal Medicine

## 2012-02-09 ENCOUNTER — Ambulatory Visit: Payer: 59 | Admitting: Cardiology

## 2012-02-29 ENCOUNTER — Telehealth: Payer: Self-pay | Admitting: Internal Medicine

## 2012-03-01 NOTE — Telephone Encounter (Signed)
lmom for pt to call back

## 2012-03-04 NOTE — Telephone Encounter (Signed)
lmom for pt to call back

## 2012-03-04 NOTE — Telephone Encounter (Signed)
No, I do not think this is a med reaction to Dexilant. Continue to monitor and call if worsening Hopefully dexilant has helped.

## 2012-03-04 NOTE — Telephone Encounter (Signed)
Pt wonders if she's having a delayed reaction to Dexilant; she has 2 soft stools daily; not watery or loose, just soft. Started Dexilant 01/15/12. Do you think this a reaction? Thanks.

## 2012-03-05 NOTE — Telephone Encounter (Signed)
Informed pt of Dr Lauro Franklin recommendations. Pt will call for worsening problems or questions.

## 2012-03-18 ENCOUNTER — Ambulatory Visit: Payer: 59 | Admitting: Cardiology

## 2012-04-17 ENCOUNTER — Telehealth: Payer: Self-pay | Admitting: Internal Medicine

## 2012-04-17 NOTE — Telephone Encounter (Signed)
Called and lvm for pt letting her know she can come and pick up samples of Dexilant.

## 2012-05-06 ENCOUNTER — Ambulatory Visit (HOSPITAL_BASED_OUTPATIENT_CLINIC_OR_DEPARTMENT_OTHER)
Admission: RE | Admit: 2012-05-06 | Discharge: 2012-05-06 | Disposition: A | Discharge status: Home / Self Care | Payer: 59 | Source: Ambulatory Visit | Attending: Nurse Practitioner | Admitting: Nurse Practitioner

## 2012-05-06 ENCOUNTER — Other Ambulatory Visit (HOSPITAL_BASED_OUTPATIENT_CLINIC_OR_DEPARTMENT_OTHER): Payer: Self-pay | Admitting: Nurse Practitioner

## 2012-05-06 DIAGNOSIS — M25559 Pain in unspecified hip: Secondary | ICD-10-CM

## 2012-05-07 ENCOUNTER — Ambulatory Visit: Payer: 59 | Admitting: Cardiology

## 2012-05-20 ENCOUNTER — Encounter: Payer: Self-pay | Admitting: Cardiology

## 2012-05-21 ENCOUNTER — Inpatient Hospital Stay (HOSPITAL_COMMUNITY): Payer: 59

## 2012-05-21 ENCOUNTER — Inpatient Hospital Stay (HOSPITAL_COMMUNITY)
Admission: AD | Admit: 2012-05-21 | Discharge: 2012-05-22 | Disposition: A | Discharge status: Home / Self Care | Payer: 59 | Source: Ambulatory Visit | Attending: Obstetrics and Gynecology | Admitting: Obstetrics and Gynecology

## 2012-05-21 ENCOUNTER — Encounter (HOSPITAL_COMMUNITY): Payer: Self-pay

## 2012-05-21 DIAGNOSIS — R1031 Right lower quadrant pain: Secondary | ICD-10-CM | POA: Insufficient documentation

## 2012-05-21 LAB — CBC
HCT: 36.2 % (ref 36.0–46.0)
Hemoglobin: 12 g/dL (ref 12.0–15.0)
MCV: 88.9 fL (ref 78.0–100.0)
RBC: 4.07 MIL/uL (ref 3.87–5.11)
WBC: 6.3 10*3/uL (ref 4.0–10.5)

## 2012-05-21 LAB — URINALYSIS, ROUTINE W REFLEX MICROSCOPIC
Bilirubin Urine: NEGATIVE
Hgb urine dipstick: NEGATIVE
Ketones, ur: NEGATIVE mg/dL
Specific Gravity, Urine: 1.02 (ref 1.005–1.030)
pH: 6 (ref 5.0–8.0)

## 2012-05-21 LAB — COMPREHENSIVE METABOLIC PANEL
AST: 18 U/L (ref 0–37)
BUN: 15 mg/dL (ref 6–23)
CO2: 28 mEq/L (ref 19–32)
Chloride: 105 mEq/L (ref 96–112)
Creatinine, Ser: 0.79 mg/dL (ref 0.50–1.10)
GFR calc Af Amer: >90 mL/min (ref >90)
GFR calc non Af Amer: >90 mL/min (ref >90)
Glucose, Bld: 101 mg/dL — ABNORMAL HIGH (ref 70–99)
Total Bilirubin: 0.2 mg/dL — ABNORMAL LOW (ref 0.3–1.2)

## 2012-05-21 LAB — LIPASE, BLOOD: Lipase: 39 U/L (ref 11–59)

## 2012-05-21 MED ORDER — HYDROMORPHONE HCL PF 1 MG/ML IJ SOLN
2.0000 mg | Freq: Once | INTRAMUSCULAR | Status: AC
  Administered 2012-05-21: 2 mg via INTRAMUSCULAR
  Filled 2012-05-21: qty 2

## 2012-05-21 MED ORDER — HYDROCODONE-ACETAMINOPHEN 5-500 MG PO TABS: 1.0000 | ORAL_TABLET | Freq: Four times a day (QID) | ORAL | Status: AC | PRN

## 2012-05-21 MED ORDER — KETOROLAC TROMETHAMINE 60 MG/2ML IM SOLN
60.0000 mg | Freq: Once | INTRAMUSCULAR | Status: AC
  Administered 2012-05-21: 60 mg via INTRAMUSCULAR
  Filled 2012-05-21: qty 2

## 2012-05-21 NOTE — Progress Notes (Signed)
Dr cousins called back and was notified of patient complaints, her vital signs and negative upt. Order to obatin cbc, cmet, amylase, lipase, abdominal ultrasound and administer toradol 60mg  im.

## 2012-05-21 NOTE — MAU Provider Note (Signed)
History   41 yo BF presents with c/o onset of abdominal pain since this am. ON presentation pt complain of sharp intermittent pain radiating to back and located in right groin area. (+) some nausea. (-) vomiting BM today. (-)urinary sx. Pt was hold epigastric area as well when she presented per RN. LMP 2 mth ago hx TL  Chief Complaint  Patient presents with  . Abdominal Pain     OB History    Grav Para Term Preterm Abortions TAB SAB Ect Mult Living   3 2 2  1  1   2       Past Medical History  Diagnosis Date  . 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 Anxiety   . PUD (peptic ulcer disease)   . Hiatal hernia   . Duodenitis   . Esophagitis   . Vitamin d deficiency   . Fatigue   . Sacroiliac pain   . Depression   . Anemia   . GERD (gastroesophageal reflux disease)   . Obesity   . Gallstones     Past Surgical History  Procedure Date  . Tubal ligation   . Breast lumpectomy     benign    Family History  Problem Relation Age of Onset  . Aneurysm Mother   . Heart disease Mother   . Diabetes Maternal Aunt     History  Substance Use Topics  . Smoking status: Current Some Day Smoker -- 0.1 packs/day    Types: Cigarettes  . Smokeless tobacco: Never Used   Comment: smokes 3 ciggs a day  . Alcohol Use: No     occasional glass of wine    Allergies: No Known Allergies  Prescriptions prior to admission  Medication Sig Dispense Refill  . acetaminophen (TYLENOL) 500 MG tablet Take 1,000 mg by mouth every 6 (six) hours as needed. pain      . albuterol (PROVENTIL HFA;VENTOLIN HFA) 108 (90 BASE) MCG/ACT inhaler Inhale 1-2 puffs into the lungs every 6 (six) hours as needed for wheezing or shortness of breath (cough).  1 Inhaler  0  . dexlansoprazole (DEXILANT) 60 MG capsule Take 1 capsule (60 mg total) by mouth daily.  30 capsule  3  . DULoxetine (CYMBALTA) 60 MG capsule Take 60 mg by mouth daily.      Marland Kitchen ibuprofen (ADVIL,MOTRIN) 200 MG tablet Take 400 mg by mouth every 6  (six) hours as needed. pain         Physical Exam   Blood pressure 131/96, pulse 89, temperature 99.1 F (37.3 C), temperature source Oral, resp. rate 18, height 5\' 6"  (1.676 m), weight 84.913 kg (187 lb 3.2 oz), last menstrual period 01/07/2012.  General appearance: alert, cooperative and no distress Lungs: clear to auscultation bilaterally Heart: regular rate and rhythm, S1, S2 normal, no murmur, click, rub or gallop Abdomen: soft, non-tender; bowel sounds normal; no masses,  no organomegaly Pelvic: cervix normal in appearance, external genitalia normal, no adnexal masses or tenderness, no cervical motion tenderness, uterus normal size, shape, and consistency and right adnexal fullness w/o marked tenderness. vagina scant white discharge Extremities: no edema, redness or tenderness in the calves or thighs ED Course  LABs: nl CBC, CMP, amylase, lipase, urinalysis neg. uPT neg abd sono: (+) Gallstone  IMP: RLQ pain P)  Pelvic sonogram r/o ovarian cyst/torsion. 2) Dilaudid. ( Toradol did not help per pt) MDM  Deashia Soule A, MD 9:47 PM 05/21/2012

## 2012-05-21 NOTE — MAU Note (Signed)
Pelvic sono : no acute findings to explain pain.  Pt notified. C/o pain still in right side. Woozy from Dilaudid  Doubt appendix given afebrile, nl wbc, BM today Doubt small  Cyst/follicle cause of pain.   IMP: RLQ pain w/o acute cause. Does not have surgical abdomen P) d/c home. F/u with GI for further eval.

## 2012-05-21 NOTE — MAU Note (Signed)
Pt reports having RLQ pain that started this morning and has gotten progressily worse. Reports some nausea.

## 2012-05-22 ENCOUNTER — Ambulatory Visit (INDEPENDENT_AMBULATORY_CARE_PROVIDER_SITE_OTHER): Payer: 59 | Admitting: Physician Assistant

## 2012-05-22 ENCOUNTER — Encounter: Payer: Self-pay | Admitting: Physician Assistant

## 2012-05-22 ENCOUNTER — Telehealth: Payer: Self-pay | Admitting: Internal Medicine

## 2012-05-22 VITALS — BP 110/70 | HR 75 | Ht 66.0 in | Wt 186.8 lb

## 2012-05-22 DIAGNOSIS — N83209 Unspecified ovarian cyst, unspecified side: Secondary | ICD-10-CM

## 2012-05-22 DIAGNOSIS — R140 Abdominal distension (gaseous): Secondary | ICD-10-CM

## 2012-05-22 DIAGNOSIS — R1013 Epigastric pain: Secondary | ICD-10-CM

## 2012-05-22 DIAGNOSIS — R141 Gas pain: Secondary | ICD-10-CM

## 2012-05-22 DIAGNOSIS — IMO0001 Reserved for inherently not codable concepts without codable children: Secondary | ICD-10-CM

## 2012-05-22 DIAGNOSIS — K8020 Calculus of gallbladder without cholecystitis without obstruction: Secondary | ICD-10-CM

## 2012-05-22 HISTORY — DX: Unspecified ovarian cyst, unspecified side: N83.209

## 2012-05-22 MED ORDER — RIFAXIMIN 550 MG PO TABS: ORAL_TABLET | ORAL | Status: DC

## 2012-05-22 MED ORDER — ALIGN 4 MG PO CAPS: 1.0000 | ORAL_CAPSULE | Freq: Every day | ORAL | Status: DC

## 2012-05-22 NOTE — Telephone Encounter (Signed)
Pr seen last PM at [facility] for abdominal pain. She had  ABD US revealing a mobile stone in the gallbladder and a transvaginal US revealing a ovarian cyst. Pt reports the pain is the same as what she saw Dr Rhea Belton for, but she reports her bowels are OK now. Pt given an appt with Mike Gip, PA today at 10am.

## 2012-05-22 NOTE — Patient Instructions (Addendum)
You have been scheduled of a [facility]CK HIDA Scan at [facility] in the Radiology Department on 06/04/12 @ 7:45. Pleas have nothing to eat or drink after midnight the night before. You have been given samples of Align 4mg  take 1 tablet every day. Stay on Dexilant 60 mg 1 every morning. Take Xifaxan 550 mg 1 tablet twice a day for 10 days. You have been scheduled to return to see Dr. Rhea Belton for a follow-up after the HIDA scan appt 06/19/12 @ 11:00.

## 2012-05-22 NOTE — Progress Notes (Signed)
Subjective:    Patient ID: Rebecca [name], female    DOB: [birth year], 41 y.o.   MRN: [id number]  HPI Rebecca [name] is a pleasant 41 year old African female known to Dr. Rhea Belton who has recently undergone evaluation for dyspepsia and abdominal bloating. She is currently being treated for GERD with Dexilant 60 mg by mouth daily which she does feel as helpful. She says she has had several months worth of upper abdominal discomfort which is present on a daily basis and seems to come and go through the day associated with bloating and distention postprandially she denies any nausea or vomiting and currently says that her bowel movements are normal. She had undergone upper endoscopy and colonoscopy in January of 2013, EGD was negative and colonoscopy revealed a tubular adenoma. At the time of her last office visit in February she was to take a trial of Xifaxan for small bowel bacterial overgrowth but says she did not realize that he had to take the medicine.the prescription but did not use it. Patient was seen in the ER at [facility] last night after she presented there with acute right lower quadrant abdominal pain. She said she had onset of the acute pain yesterday which was very sharp and without radiation . Labs from that visit are reviewed she had a normal CMET,  LFTs, lipase normal at 39 UA was negative WBC 6.3 hemoglobin 12 hematocrit 36.2. She underwent a transvaginal pelvic ultrasound which did reveal a right ovarian cyst which appeared hemorrhagic, and measured 1.4 x 1.1 cm. Followup in 2-3 months was recommended. Because she also complained of ongoing problems with upper abdominal discomfort she had upper abdominal ultrasound done as well which shows one 9 mm mobile stone in the gallbladder, no gallbladder wall thickening common bile duct normal at 3.4 mm and remainder of exam unremarkable. She says she feels better today but doesn't remember much of the discussion with the M.D. last night because she  had been given Dilaudid. She says she still hurting in her right lower quadrant though  not as bad as she was yesterday.    Review of Systems  Constitutional: Negative.   HENT: Negative.   Eyes: Negative.   Respiratory: Negative.   Cardiovascular: Negative.   Gastrointestinal: Positive for abdominal pain and abdominal distention.  Genitourinary: Negative.   Musculoskeletal: Negative.   Neurological: Negative.   Hematological: Negative.   Psychiatric/Behavioral: Negative.    Outpatient Prescriptions Prior to Visit  Medication Sig Dispense Refill  . albuterol (PROVENTIL HFA;VENTOLIN HFA) 108 (90 BASE) MCG/ACT inhaler Inhale 1-2 puffs into the lungs every 6 (six) hours as needed for wheezing or shortness of breath (cough).  1 Inhaler  0  . dexlansoprazole (DEXILANT) 60 MG capsule Take 1 capsule (60 mg total) by mouth daily.  30 capsule  3  . DULoxetine (CYMBALTA) 60 MG capsule Take 60 mg by mouth daily.      Marland Kitchen ibuprofen (ADVIL,MOTRIN) 200 MG tablet Take 400 mg by mouth every 6 (six) hours as needed. pain      . acetaminophen (TYLENOL) 500 MG tablet Take 1,000 mg by mouth every 6 (six) hours as needed. pain      . HYDROcodone-acetaminophen (VICODIN) 5-500 MG per tablet Take 1 tablet by mouth every 6 (six) hours as needed for pain.  30 tablet  0   No facility-administered medications prior to visit.      No Known Allergies Patient Active Problem List  Diagnosis  . PALPITATIONS  . GERD (gastroesophageal reflux  disease)  . Constipation  . Vitamin d deficiency  . History of depression  . Dyspepsia  . Adenomatous colon polyp  . Cholelithiasis  . Hemorrhagic ovarian cyst   History   Social History  . Marital Status: Married    Spouse Name: N/A    Number of Children: 2  . Years of Education: N/A   Occupational History  . [employer]   Social History Main Topics  . Smoking status: Current Some Day Smoker -- 0.1 packs/day    Types: Cigarettes  . Smokeless tobacco:  Never Used   Comment: smokes 3 ciggs a day, tobacco info given 05/22/12  . Alcohol Use: No     occasional glass of wine  . Drug Use: No  . Sexually Active: Not on file   Other Topics Concern  . Not on file   Social History Narrative  . No narrative on file    Objective:   Physical Exam well-developed after female in no acute distress, pleasant blood pressure 110/70 pulse 75 height 5 foot 6 weight 186. HEENT; nontraumatic normocephalic EOMI PERRLA sclera anicteric, Neck; supple no JVD, Cardiovascular; regular rate and rhythm with S1-S2 no murmur or gallop, Pulmonary; clear bilaterally, Abdomen; soft she is mildly tender in the epigastrium and in the right lower quadrant  there is no guarding, no rebound, no palpable mass or hepatosplenomegaly bowel sounds are present, Rectal; exam not done, Extremities ;no clubbing, cyanosis or edema ,skin warm and dry, Psych; mood and affect normal and appropriate.        Assessment & Plan:  #51 41 year old female with persistent dyspeptic symptoms ongoing upper abdominal discomfort associated with bloating and distention. 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 However she does have cholelithiasis as well. Her symptoms are not typical of biliary colic, will rule out biliary dyskinesia  #2 GERD stable on Dexilant #3 history of adenomatous colon polyp-will need followup colonoscopy 2018 #4 acute right lower quadrant pain secondary to small hemorrhagic ovarian cyst.  Plan; continue Dexilant 60 mg by mouth daily in a.m. Advised patient to take the course of Xifaxan 550 mg by mouth twice daily x10 days to treat for potential small bowel bacterial overgrowth. Add Align  one by mouth daily x30 days Schedule for CCK HIDA scan Followup with Dr. Rhea Belton in 2-3 weeks to reassess. Patient is to followup with her gynecologist in 3 months for repeat pelvic ultrasound.

## 2012-05-23 NOTE — Progress Notes (Signed)
Agree with initial assessment and plans 

## 2012-06-04 ENCOUNTER — Encounter (HOSPITAL_COMMUNITY): Payer: Self-pay

## 2012-06-04 ENCOUNTER — Encounter (HOSPITAL_COMMUNITY)
Admission: RE | Admit: 2012-06-04 | Discharge: 2012-06-04 | Disposition: A | Discharge status: Home / Self Care | Payer: 59 | Source: Ambulatory Visit | Attending: Physician Assistant | Admitting: Physician Assistant

## 2012-06-04 DIAGNOSIS — R140 Abdominal distension (gaseous): Secondary | ICD-10-CM

## 2012-06-04 DIAGNOSIS — IMO0001 Reserved for inherently not codable concepts without codable children: Secondary | ICD-10-CM

## 2012-06-04 DIAGNOSIS — R141 Gas pain: Secondary | ICD-10-CM | POA: Insufficient documentation

## 2012-06-04 DIAGNOSIS — R1013 Epigastric pain: Secondary | ICD-10-CM | POA: Insufficient documentation

## 2012-06-04 DIAGNOSIS — R142 Eructation: Secondary | ICD-10-CM | POA: Insufficient documentation

## 2012-06-04 LAB — PREGNANCY, URINE: Preg Test, Ur: NEGATIVE

## 2012-06-04 MED ORDER — SINCALIDE 5 MCG IJ SOLR: 0.0200 ug/kg | Freq: Once | INTRAMUSCULAR | Status: DC

## 2012-06-04 MED ORDER — TECHNETIUM TC 99M MEBROFENIN IV KIT
5.5000 | PACK | Freq: Once | INTRAVENOUS | Status: AC | PRN
Start: 2012-06-04 — End: 2012-06-04
  Administered 2012-06-04: 5.5 via INTRAVENOUS

## 2012-06-13 ENCOUNTER — Encounter: Payer: Self-pay | Admitting: Internal Medicine

## 2012-06-19 ENCOUNTER — Ambulatory Visit: Payer: 59 | Admitting: Internal Medicine

## 2012-07-05 ENCOUNTER — Other Ambulatory Visit: Payer: Self-pay | Admitting: Obstetrics and Gynecology

## 2012-07-05 DIAGNOSIS — Z1231 Encounter for screening mammogram for malignant neoplasm of breast: Secondary | ICD-10-CM

## 2012-07-11 ENCOUNTER — Ambulatory Visit: Payer: 59

## 2012-12-26 ENCOUNTER — Ambulatory Visit: Payer: 59 | Admitting: Family Medicine

## 2012-12-26 DIAGNOSIS — Z0289 Encounter for other administrative examinations: Secondary | ICD-10-CM

## 2013-03-27 ENCOUNTER — Telehealth: Payer: Self-pay | Admitting: Internal Medicine

## 2013-03-27 NOTE — Telephone Encounter (Signed)
Patient says that her stomach feels bloated/having a lot burping/ change in bowel habits.  Offered her appt with Dr. Rhea Belton for 04/04/13 but she would like to see a PA sooner.

## 2013-03-27 NOTE — Telephone Encounter (Signed)
Pt reports the same problems as last year; bloating and abdominal pain. She was seen by Mike Gip, PA in June, 2013 and was placed on Xifaxan for 10 days; had a - Hida Scan also. She cancelled her f/u with Dr Rhea Belton. She states she could not afford the Dexilant; co pay >$100. She gets impacted frequently and states it's probably d/t IBS. She will see Mike Gip, PA on 03/31/13.

## 2013-03-28 ENCOUNTER — Encounter: Payer: Self-pay | Admitting: *Deleted

## 2013-03-31 ENCOUNTER — Encounter: Payer: Self-pay | Admitting: Physician Assistant

## 2013-03-31 ENCOUNTER — Ambulatory Visit (INDEPENDENT_AMBULATORY_CARE_PROVIDER_SITE_OTHER): Payer: 59 | Admitting: Physician Assistant

## 2013-03-31 VITALS — BP 120/86 | HR 84 | Ht 66.0 in | Wt 197.8 lb

## 2013-03-31 DIAGNOSIS — K2970 Gastritis, unspecified, without bleeding: Secondary | ICD-10-CM

## 2013-03-31 DIAGNOSIS — K589 Irritable bowel syndrome without diarrhea: Secondary | ICD-10-CM

## 2013-03-31 DIAGNOSIS — K5900 Constipation, unspecified: Secondary | ICD-10-CM

## 2013-03-31 DIAGNOSIS — K2990 Gastroduodenitis, unspecified, without bleeding: Secondary | ICD-10-CM

## 2013-03-31 MED ORDER — MOVIPREP 100 G PO SOLR: 1.0000 | Freq: Once | ORAL | Status: DC

## 2013-03-31 MED ORDER — PANTOPRAZOLE SODIUM 40 MG PO TBEC: 40.0000 mg | DELAYED_RELEASE_TABLET | Freq: Every day | ORAL | Status: DC

## 2013-03-31 MED ORDER — POLYETHYLENE GLYCOL 3350 17 GM/SCOOP PO POWD: 17.0000 g | Freq: Every day | ORAL | Status: DC

## 2013-03-31 MED ORDER — NA SULFATE-K SULFATE-MG SULF 17.5-3.13-1.6 GM/177ML PO SOLN: 1.0000 | Freq: Once | ORAL | Status: AC

## 2013-03-31 NOTE — Progress Notes (Addendum)
Subjective:    Patient ID: Rebecca [name], female    DOB: [birth year], 42 y.o.   MRN: [id number]  HPI Rebecca [name] is a very nice 42 year old African American female known to Dr. Rhea Belton who has been seen in the past for abdominal pain and bloating. She underwent workup about a year ago for similar complaints and had upper abdominal ultrasound done which showed 19 mm gallstone in the gallbladder and otherwise negative study. She had upper endoscopy which showed some nodular mucosa in the upper esophagus but otherwise normal. Colonoscopy was done in January of 2013 and also normal. She had CCK HIDA scan in July of 2013 with a normal EF of 74%. She had been given a course of Xifaxan for possible small bowel bacterial overgrowth and thinks that this helped but cannot tell me for certain today. Comes back in today stating that she's been having similar problems again over the past few months. She says she did well for several months and then her symptoms recurred. She said she did have a sinus infection in April took antibiotics at that time and feels that her symptoms increased after that. Primarily she is complaining of ongoing constipation though she does have a bowel movement every day does not feel that she evacuates her bowels well and feels bloated and full on a regular basis. She also complains of feeling gassy occasionally nauseated and has intermittent epigastric and left upper quadrant discomfort. Her appetite has been fine and her weight has been stable. On further questioning she does admit to taking multiple ibuprofen per day. She says she does is out of habit more than anything else for aches and pains but may take as much as 1200 mg of ibuprofen per day.     Review of Systems  Constitutional: Negative.   HENT: Negative.   Eyes: Negative.   Respiratory: Negative.   Cardiovascular: Negative.   Gastrointestinal: Positive for abdominal pain, constipation and abdominal distention.  Endocrine:  Negative.   Genitourinary: Negative.   Musculoskeletal: Negative.   Skin: Negative.   Allergic/Immunologic: Negative.   Neurological: Negative.   Hematological: Negative.   Psychiatric/Behavioral: Negative.    Outpatient Prescriptions Prior to Visit  Medication Sig Dispense Refill  . acetaminophen (TYLENOL) 500 MG tablet Take 1,000 mg by mouth every 6 (six) hours as needed. pain      . DULoxetine (CYMBALTA) 60 MG capsule Take 60 mg by mouth daily.      Marland Kitchen ibuprofen (ADVIL,MOTRIN) 200 MG tablet Take 400 mg by mouth every 6 (six) hours as needed. pain      . albuterol (PROVENTIL HFA;VENTOLIN HFA) 108 (90 BASE) MCG/ACT inhaler Inhale 1-2 puffs into the lungs every 6 (six) hours as needed for wheezing or shortness of breath (cough).  1 Inhaler  0  . dexlansoprazole (DEXILANT) 60 MG capsule Take 1 capsule (60 mg total) by mouth daily.  30 capsule  3  . Probiotic Product (ALIGN) 4 MG CAPS Take 1 tablet by mouth daily.  28 capsule  0  . rifaximin (XIFAXAN) 550 MG TABS Take 1 tablet twice a day for 10 days  60 tablet  0   No facility-administered medications prior to visit.   No Known Allergies Patient Active Problem List   Diagnosis Date Noted  . Cholelithiasis 05/22/2012  . Hemorrhagic ovarian cyst 05/22/2012  . Dyspepsia 01/15/2012  . Adenomatous colon polyp 01/15/2012  . Constipation 12/12/2011  . Vitamin d deficiency 12/12/2011  . History of depression 12/12/2011  . PALPITATIONS  01/24/2007  . GERD (gastroesophageal reflux disease) 01/24/2007   History  Substance Use Topics  . Smoking status: Current Some Day Smoker -- 0.10 packs/day    Types: Cigarettes  . Smokeless tobacco: Never Used     Comment: smokes 3 cigs a day, tobacco info given 05/22/12  . Alcohol Use: Yes     Comment: occasional glass of wine   family history includes Aneurysm in her mother; Diabetes in her maternal aunt; and Heart disease in her mother.  There is no history of Colon cancer.      Objective:    Physical Exam well-developed African American female in no acute distress, pleasant blood pressure 120/86 pulse 84 height 5 foot 6 weight 197. HEENT; nontraumatic normocephalic EOMI PERRLA sclera anicteric, Neck;Supple no JVD, Cardiovascular regular rate and rhythm with S1-S2 no murmur or gallop, Pulmonary; clear bilaterally, Abdomen; soft nondistended there is no focal tenderness no palpable mass or hepatosplenomegaly no guarding or rebound, Rectal; exam not done, Extremities; no clubbing cyanosis or edema skin warm and dry, Psych; mood and affect normal and appropriate        Assessment & Plan:  #72 42 year old female with probable IBS manifested by constipation and abdominal bloating and fullness. Patient had fairly extensive workup about a year ago which was negative with the exception of a single gallstone. Her symptoms today are not consistent with biliary colic and much more consistent with IBS . I also suspect that she has dyspeptic symptoms secondary to ongoing NSAID use.  Plan; Patient is asked to stop anti-inflammatories or at the very least significantly decrease usage Start Protonix 40 mg by mouth every morning Start MiraLax 17 g in juice or water every morning We will give her a bowel purge with suprep then start the MiraLax on a daily basis She will followup in the office in 3-4 weeks or sooner if needed  Addendum: Reviewed and agree with management. Beverley Fiedler, MD

## 2013-03-31 NOTE — Patient Instructions (Addendum)
We have given you a prep to help you purge your bowels.  Suprep. Follow instructions on the box.  We gave you some Nexium samples, take 1 daily in the Am.   We sent a prescription for Pantoprazole Sodium 40 mg ( Protonix) to [facility]. We have also given you a savings card to give the pharmacist for the Pantoprazole Sodium.  Also sending a prescription for Miralax, generic. Take 17 grams daily in juice or water.

## 2013-04-07 ENCOUNTER — Other Ambulatory Visit (HOSPITAL_COMMUNITY): Payer: Self-pay | Admitting: Emergency Medicine

## 2013-04-07 DIAGNOSIS — Z1231 Encounter for screening mammogram for malignant neoplasm of breast: Secondary | ICD-10-CM

## 2013-04-10 ENCOUNTER — Encounter: Payer: 59 | Admitting: Internal Medicine

## 2013-04-11 ENCOUNTER — Ambulatory Visit (HOSPITAL_COMMUNITY)
Admission: RE | Admit: 2013-04-11 | Discharge: 2013-04-11 | Disposition: A | Discharge status: Home / Self Care | Payer: 59 | Source: Ambulatory Visit | Attending: Emergency Medicine | Admitting: Emergency Medicine

## 2013-04-11 DIAGNOSIS — Z1231 Encounter for screening mammogram for malignant neoplasm of breast: Secondary | ICD-10-CM | POA: Insufficient documentation

## 2013-04-11 LAB — HM MAMMOGRAPHY

## 2013-04-14 ENCOUNTER — Other Ambulatory Visit: Payer: Self-pay | Admitting: Emergency Medicine

## 2013-04-14 DIAGNOSIS — R928 Other abnormal and inconclusive findings on diagnostic imaging of breast: Secondary | ICD-10-CM

## 2013-04-23 ENCOUNTER — Ambulatory Visit
Admission: RE | Admit: 2013-04-23 | Discharge: 2013-04-23 | Disposition: A | Discharge status: Home / Self Care | Payer: 59 | Source: Ambulatory Visit | Attending: Emergency Medicine | Admitting: Emergency Medicine

## 2013-04-23 DIAGNOSIS — R928 Other abnormal and inconclusive findings on diagnostic imaging of breast: Secondary | ICD-10-CM

## 2013-04-25 ENCOUNTER — Other Ambulatory Visit: Payer: 59

## 2013-04-28 ENCOUNTER — Other Ambulatory Visit: Payer: Self-pay | Admitting: Internal Medicine

## 2013-04-28 ENCOUNTER — Other Ambulatory Visit (HOSPITAL_COMMUNITY): Payer: Self-pay | Admitting: Internal Medicine

## 2013-04-28 DIAGNOSIS — R519 Headache, unspecified: Secondary | ICD-10-CM

## 2013-04-30 ENCOUNTER — Ambulatory Visit (HOSPITAL_COMMUNITY)
Admission: RE | Admit: 2013-04-30 | Discharge: 2013-04-30 | Disposition: A | Discharge status: Home / Self Care | Payer: 59 | Source: Ambulatory Visit | Attending: Internal Medicine | Admitting: Internal Medicine

## 2013-04-30 ENCOUNTER — Ambulatory Visit (HOSPITAL_COMMUNITY): Payer: 59

## 2013-04-30 DIAGNOSIS — J338 Other polyp of sinus: Secondary | ICD-10-CM | POA: Insufficient documentation

## 2013-04-30 DIAGNOSIS — R519 Headache, unspecified: Secondary | ICD-10-CM

## 2013-04-30 DIAGNOSIS — H539 Unspecified visual disturbance: Secondary | ICD-10-CM | POA: Insufficient documentation

## 2013-04-30 DIAGNOSIS — R51 Headache: Secondary | ICD-10-CM | POA: Insufficient documentation

## 2013-04-30 DIAGNOSIS — I739 Peripheral vascular disease, unspecified: Secondary | ICD-10-CM | POA: Insufficient documentation

## 2013-04-30 DIAGNOSIS — Z8249 Family history of ischemic heart disease and other diseases of the circulatory system: Secondary | ICD-10-CM | POA: Insufficient documentation

## 2013-04-30 MED ORDER — GADOBENATE DIMEGLUMINE 529 MG/ML IV SOLN
18.0000 mL | Freq: Once | INTRAVENOUS | Status: AC | PRN
  Administered 2013-04-30: 18 mL via INTRAVENOUS

## 2013-09-28 ENCOUNTER — Encounter: Payer: Self-pay | Admitting: Internal Medicine

## 2013-10-06 ENCOUNTER — Encounter: Payer: Self-pay | Admitting: Emergency Medicine

## 2013-10-06 ENCOUNTER — Ambulatory Visit: Payer: 59 | Admitting: Emergency Medicine

## 2013-10-06 VITALS — BP 122/86 | HR 76 | Temp 98.0°F | Resp 16 | Ht 67.25 in | Wt 199.0 lb

## 2013-10-06 DIAGNOSIS — E559 Vitamin D deficiency, unspecified: Secondary | ICD-10-CM

## 2013-10-06 DIAGNOSIS — E782 Mixed hyperlipidemia: Secondary | ICD-10-CM | POA: Insufficient documentation

## 2013-10-06 LAB — CBC WITH DIFFERENTIAL/PLATELET
Basophils Absolute: 0 10*3/uL (ref 0.0–0.1)
Eosinophils Relative: 4 % (ref 0–5)
HCT: 37.7 % (ref 36.0–46.0)
Hemoglobin: 12.4 g/dL (ref 12.0–15.0)
Lymphocytes Relative: 40 % (ref 12–46)
Lymphs Abs: 1.8 10*3/uL (ref 0.7–4.0)
MCV: 89.1 fL (ref 78.0–100.0)
Monocytes Absolute: 0.5 10*3/uL (ref 0.1–1.0)
Monocytes Relative: 11 % (ref 3–12)
Neutro Abs: 2.1 10*3/uL (ref 1.7–7.7)
RDW: 13.3 % (ref 11.5–15.5)
WBC: 4.6 10*3/uL (ref 4.0–10.5)

## 2013-10-06 NOTE — Progress Notes (Signed)
  Subjective:    Patient ID: Rebecca [name], female    DOB: 12-01-1970, 42 y.o.   MRN: [id number]  HPI Comments: 42 yo female presents for recheck of cholesterol and vitamin D. She has started improving diet, exercise and added the nicotine patch. She is doing well overall and is joinINGg a health program thru work. Last labs T 169 TG 81 HDL 48 LDL 105 D 43  Hyperlipidemia  Anxiety      Current Outpatient Prescriptions on File Prior to Visit  Medication Sig Dispense Refill  . acetaminophen (TYLENOL) 500 MG tablet Take 1,000 mg by mouth every 6 (six) hours as needed. pain      . ALPRAZolam (XANAX) 0.25 MG tablet Take 0.25 mg by mouth 3 (three) times daily as needed for sleep.      Marland Kitchen bismuth subsalicylate (PEPTO BISMOL) 262 MG/15ML suspension Take 15 mLs by mouth every 6 (six) hours as needed.      . calcium carbonate (TUMS - DOSED IN MG ELEMENTAL CALCIUM) 500 MG chewable tablet Chew 2 tablets by mouth as needed for heartburn.      . DULoxetine (CYMBALTA) 60 MG capsule Take 60 mg by mouth daily.      Marland Kitchen ibuprofen (ADVIL,MOTRIN) 200 MG tablet Take 400 mg by mouth every 6 (six) hours as needed. pain      . pantoprazole (PROTONIX) 40 MG tablet Take 1 tablet (40 mg total) by mouth daily.  90 tablet  3  . polyethylene glycol powder (GLYCOLAX/MIRALAX) powder Take 17 g by mouth daily.  527 g  2  . cholecalciferol (VITAMIN D) 1000 UNITS tablet Take 3,000 Units by mouth daily.       No current facility-administered medications on file prior to visit.   Review of patient's allergies indicates no known allergies. Past Medical History  Diagnosis Date  . 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 Anxiety   . PUD (peptic ulcer disease)   . Hiatal hernia   . Duodenitis   . Esophagitis   . Vitamin D deficiency   . Fatigue   . Sacroiliac pain   . Depression   . Anemia   . GERD (gastroesophageal reflux disease)   . Obesity   . Gallstones   . Colon polyp 12/25/2011    Tubular adenomatous      Review of Systems  All other systems reviewed and are negative.   BP 122/86  Pulse 76  Temp(Src) 98 F (36.7 C) (Temporal)  Resp 16  Ht 5' 7.25" (1.708 m)  Wt 199 lb (90.266 kg)  BMI 30.94 kg/m2  LMP 07/09/2013     Objective:   Physical Exam  Nursing note and vitals reviewed. Constitutional: She is oriented to person, place, and time. She appears well-developed and well-nourished.  HENT:  Head: Normocephalic and atraumatic.  Neck: Normal range of motion.  Cardiovascular: Normal rate, regular rhythm, normal heart sounds and intact distal pulses.   Pulmonary/Chest: Effort normal and breath sounds normal.  Neurological: She is alert and oriented to person, place, and time.  Skin: Skin is warm and dry.  Psychiatric: She has a normal mood and affect.          Assessment & Plan:  3 MONTH RECHECK CHOLESTEROL / VIT D. Continue AD.

## 2013-10-06 NOTE — Patient Instructions (Signed)
Vitamin D Deficiency  Not having enough vitamin D is called a deficiency. Your body needs this vitamin to keep your bones strong and healthy. Having too little of it can make your bones soft or can cause other health problems.  HOME CARE  Take all vitamins, herbs, or nutrition drinks (supplements) as told by your doctor.  Have your blood tested 2 months after taking vitamins, herbs, or nutrition drinks.  Eat foods that have vitamin D. This includes:  Dairy products, cereals, or juices with added vitamin D. Check the label.  Fatty fish like salmon or trout.  Eggs.  Oysters.  Do not use tanning beds.  Stay at a healthy weight. Lose weight if needed.  Keep all doctor visits as told. GET HELP IF:  You have questions.  You continue to have problems.  You feel sick to your stomach (nauseous) or throw up (vomit).  You cannot go poop (constipated).  You feel confused.  You have severe belly (abdominal) or back pain. MAKE SURE YOU:  Understand these instructions.  Will watch your condition.  Will get help right away if you are not doing well or get worse. Document Released: 11/02/2011 Document Revised: 03/10/2013 Document Reviewed: 11/02/2011 [facility] Patient Information 2014 [place], [place]. Hypercholesterolemia High Blood Cholesterol Cholesterol is a white, waxy, fat-like protein needed by your body in small amounts. The liver makes all the cholesterol you need. It is carried from the liver by the blood through the blood vessels. Deposits (plaque) may build up on blood vessel walls. This makes the arteries narrower and stiffer. Plaque increases the risk for heart attack and stroke. You cannot feel your cholesterol level even if it is very high. The only way to know is by a blood test to check your lipid (fats) levels. Once you know your cholesterol levels, you should keep a record of the test results. Work with your caregiver to to keep your levels in the desired range. WHAT  THE RESULTS MEAN:  Total cholesterol is a rough measure of all the cholesterol in your blood.  LDL is the so-called bad cholesterol. This is the type that deposits cholesterol in the walls of the arteries. You want this level to be low.  HDL is the good cholesterol because it cleans the arteries and carries the LDL away. You want this level to be high.  Triglycerides are fat that the body can either burn for energy or store. High levels are closely linked to heart disease. DESIRED LEVELS:  Total cholesterol below 200.  LDL below 100 for people at risk, below 70 for very high risk.  HDL above 50 is good, above 60 is best.  Triglycerides below 150. HOW TO LOWER YOUR CHOLESTEROL:  Diet.  Choose fish or white meat chicken and [place], roasted or baked. Limit fatty cuts of red meat, fried foods, and processed meats, such as sausage and lunch meat.  Eat lots of fresh fruits and vegetables. Choose whole grains, beans, pasta, potatoes and cereals.  Use only small amounts of olive, corn or canola oils. Avoid butter, mayonnaise, shortening or palm kernel oils. Avoid foods with trans-fats.  Use skim/nonfat milk and low-fat/nonfat yogurt and cheeses. Avoid whole milk, cream, ice cream, egg yolks and cheeses. Healthy desserts include angel food cake, gingersnaps, animal crackers, hard candy, popsicles, and low-fat/nonfat frozen yogurt. Avoid pastries, cakes, pies and cookies.  Exercise.  A regular program helps decrease LDL and raises HDL.  Helps with weight control.  Do things that increase your activity  level like gardening, walking, or taking the stairs.  Medication.  May be prescribed by your caregiver to help lowering cholesterol and the risk for heart disease.  You may need medicine even if your levels are normal if you have several risk factors. HOME CARE INSTRUCTIONS   Follow your diet and exercise programs as suggested by your caregiver.  Take medications as  directed.  Have blood work done when your caregiver feels it is necessary. MAKE SURE YOU:   Understand these instructions.  Will watch your condition.  Will get help right away if you are not doing well or get worse. Document Released: 11/13/2005 Document Revised: 02/05/2012 Document Reviewed: 05/01/2007 [facility] Patient Information 2014 [place], [place].

## 2013-10-07 LAB — HEPATIC FUNCTION PANEL
Albumin: 3.8 g/dL (ref 3.5–5.2)
Alkaline Phosphatase: 65 U/L (ref 39–117)
Indirect Bilirubin: 0.3 mg/dL (ref 0.0–0.9)
Total Bilirubin: 0.4 mg/dL (ref 0.3–1.2)
Total Protein: 6.1 g/dL (ref 6.0–8.3)

## 2013-10-07 LAB — VITAMIN D 25 HYDROXY (VIT D DEFICIENCY, FRACTURES): Vit D, 25-Hydroxy: 32 ng/mL (ref 30–89)

## 2013-10-07 LAB — LIPID PANEL
Cholesterol: 159 mg/dL (ref 0–200)
HDL: 53 mg/dL (ref >39)
Total CHOL/HDL Ratio: 3 Ratio

## 2013-11-13 ENCOUNTER — Other Ambulatory Visit: Payer: Self-pay | Admitting: Emergency Medicine

## 2013-11-13 ENCOUNTER — Encounter: Payer: Self-pay | Admitting: Physician Assistant

## 2013-11-13 ENCOUNTER — Ambulatory Visit
Admission: RE | Admit: 2013-11-13 | Discharge: 2013-11-13 | Disposition: A | Discharge status: Home / Self Care | Payer: 59 | Source: Ambulatory Visit | Attending: Physician Assistant | Admitting: Physician Assistant

## 2013-11-13 ENCOUNTER — Ambulatory Visit (INDEPENDENT_AMBULATORY_CARE_PROVIDER_SITE_OTHER): Payer: 59 | Admitting: Physician Assistant

## 2013-11-13 VITALS — BP 110/82 | HR 88 | Temp 97.9°F | Resp 16 | Ht 67.0 in | Wt 196.0 lb

## 2013-11-13 DIAGNOSIS — IMO0001 Reserved for inherently not codable concepts without codable children: Secondary | ICD-10-CM

## 2013-11-13 DIAGNOSIS — Z86718 Personal history of other venous thrombosis and embolism: Secondary | ICD-10-CM

## 2013-11-13 DIAGNOSIS — IMO0002 Reserved for concepts with insufficient information to code with codable children: Secondary | ICD-10-CM

## 2013-11-13 DIAGNOSIS — M79609 Pain in unspecified limb: Secondary | ICD-10-CM

## 2013-11-13 DIAGNOSIS — M79604 Pain in right leg: Secondary | ICD-10-CM

## 2013-11-13 LAB — BASIC METABOLIC PANEL WITH GFR
BUN: 11 mg/dL (ref 6–23)
CO2: 30 mEq/L (ref 19–32)
Calcium: 9.6 mg/dL (ref 8.4–10.5)
GFR, Est African American: >89 mL/min
Glucose, Bld: 90 mg/dL (ref 70–99)

## 2013-11-13 LAB — HEPATIC FUNCTION PANEL
ALT: 18 U/L (ref 0–35)
AST: 19 U/L (ref 0–37)
Albumin: 4.3 g/dL (ref 3.5–5.2)
Indirect Bilirubin: 0.4 mg/dL (ref 0.0–0.9)
Total Protein: 6.9 g/dL (ref 6.0–8.3)

## 2013-11-13 LAB — CBC WITH DIFFERENTIAL/PLATELET
Basophils Relative: 0 % (ref 0–1)
Eosinophils Absolute: 0.2 10*3/uL (ref 0.0–0.7)
HCT: 39.5 % (ref 36.0–46.0)
Hemoglobin: 13.3 g/dL (ref 12.0–15.0)
Lymphocytes Relative: 41 % (ref 12–46)
MCH: 30.4 pg (ref 26.0–34.0)
MCHC: 33.7 g/dL (ref 30.0–36.0)
MCV: 90.4 fL (ref 78.0–100.0)
Monocytes Absolute: 0.5 10*3/uL (ref 0.1–1.0)
Monocytes Relative: 10 % (ref 3–12)
RBC: 4.37 MIL/uL (ref 3.87–5.11)
WBC: 5.3 10*3/uL (ref 4.0–10.5)

## 2013-11-13 LAB — MAGNESIUM: Magnesium: 1.9 mg/dL (ref 1.5–2.5)

## 2013-11-13 MED ORDER — PREGABALIN 75 MG PO CAPS: 75.0000 mg | ORAL_CAPSULE | Freq: Three times a day (TID) | ORAL | Status: DC

## 2013-11-13 MED ORDER — TRAMADOL HCL 50 MG PO TABS: 50.0000 mg | ORAL_TABLET | Freq: Four times a day (QID) | ORAL | Status: DC | PRN

## 2013-11-13 MED ORDER — PREDNISONE 20 MG PO TABS: ORAL_TABLET | ORAL | Status: DC

## 2013-11-13 NOTE — Patient Instructions (Signed)
If this is your Right sciatic nerve then we will treat it like this: Take the prednisone- this is a very strong antiinflammatory- do not take other antiinflammatories with it like advil or aleve- you can stop this at any time if you are feeling better and please take it with food.  The lyrica is for the nerve pain- I would suggest taking it at night to help you sleep, you can take 1-2 pills The tramadol is a non addictive pain medciation that you can take as needed. Please try this medication at night first in case it makes you sleepy.   Sciatica Sciatica is pain, weakness, numbness, or tingling along the path of the sciatic nerve. The nerve starts in the lower back and runs down the back of each leg. The nerve controls the muscles in the lower leg and in the back of the knee, while also providing sensation to the back of the thigh, lower leg, and the sole of your foot. Sciatica is a symptom of another medical condition. For instance, nerve damage or certain conditions, such as a herniated disk or bone spur on the spine, pinch or put pressure on the sciatic nerve. This causes the pain, weakness, or other sensations normally associated with sciatica. Generally, sciatica only affects one side of the body. CAUSES   Herniated or slipped disc.  Degenerative disk disease.  A pain disorder involving the narrow muscle in the buttocks (piriformis syndrome).  Pelvic injury or fracture.  Pregnancy.  Tumor (rare). SYMPTOMS  Symptoms can vary from mild to very severe. The symptoms usually travel from the low back to the buttocks and down the back of the leg. Symptoms can include:  Mild tingling or dull aches in the lower back, leg, or hip.  Numbness in the back of the calf or sole of the foot.  Burning sensations in the lower back, leg, or hip.  Sharp pains in the lower back, leg, or hip.  Leg weakness.  Severe back pain inhibiting movement. These symptoms may get worse with coughing, sneezing,  laughing, or prolonged sitting or standing. Also, being overweight may worsen symptoms. DIAGNOSIS  Your caregiver will perform a physical exam to look for common symptoms of sciatica. He or she may ask you to do certain movements or activities that would trigger sciatic nerve pain. Other tests may be performed to find the cause of the sciatica. These may include:  Blood tests.  X-rays.  Imaging tests, such as an MRI or CT scan. TREATMENT  Treatment is directed at the cause of the sciatic pain. Sometimes, treatment is not necessary and the pain and discomfort goes away on its own. If treatment is needed, your caregiver may suggest:  Over-the-counter medicines to relieve pain.  Prescription medicines, such as anti-inflammatory medicine, muscle relaxants, or narcotics.  Applying heat or ice to the painful area.  Steroid injections to lessen pain, irritation, and inflammation around the nerve.  Reducing activity during periods of pain.  Exercising and stretching to strengthen your abdomen and improve flexibility of your spine. Your caregiver may suggest losing weight if the extra weight makes the back pain worse.  Physical therapy.  Surgery to eliminate what is pressing or pinching the nerve, such as a bone spur or part of a herniated disk. HOME CARE INSTRUCTIONS   Only take over-the-counter or prescription medicines for pain or discomfort as directed by your caregiver.  Apply ice to the affected area for 20 minutes, 3 4 times a day for the  first 48 72 hours. Then try heat in the same way.  Exercise, stretch, or perform your usual activities if these do not aggravate your pain.  Attend physical therapy sessions as directed by your caregiver.  Keep all follow-up appointments as directed by your caregiver.  Do not wear high heels or shoes that do not provide proper support.  Check your mattress to see if it is too soft. A firm mattress may lessen your pain and discomfort. SEEK  IMMEDIATE MEDICAL CARE IF:   You lose control of your bowel or bladder (incontinence).  You have increasing weakness in the lower back, pelvis, buttocks, or legs.  You have redness or swelling of your back.  You have a burning sensation when you urinate.  You have pain that gets worse when you lie down or awakens you at night.  Your pain is worse than you have experienced in the past.  Your pain is lasting longer than 4 weeks.  You are suddenly losing weight without reason. MAKE SURE YOU:  Understand these instructions.  Will watch your condition.  Will get help right away if you are not doing well or get worse. Document Released: 11/07/2001 Document Revised: 05/14/2012 Document Reviewed: 03/24/2012 [facility] Patient Information 2014 [place], [place].

## 2013-11-13 NOTE — Progress Notes (Signed)
   Subjective:    Patient ID: Rebecca [name], female    DOB: [birth year], 42 y.o.   MRN: [id number]  HPI Woke up Tuesday morning with pain in right leg/calf, denies swelling. Denies recent travel, no injury. Did have a blood clot when she was pregnant with her daughter. Denies CP, SOB, nausea, diarrhea . She smokes but is in the process of quitting using patches as well.  She does have lower back pain.   Review of Systems  Constitutional: Negative.   HENT: Negative.   Eyes: Negative.   Respiratory: Negative.   Cardiovascular: Negative.   Gastrointestinal: Negative.   Genitourinary: Negative.   Musculoskeletal: Positive for back pain and myalgias.  Skin: Negative.   Neurological: Negative.        Objective:   Physical Exam  Constitutional: She is oriented to person, place, and time. She appears well-developed and well-nourished.  HENT:  Head: Normocephalic and atraumatic.  Right Ear: External ear normal.  Left Ear: External ear normal.  Mouth/Throat: Oropharynx is clear and moist.  Eyes: Conjunctivae and EOM are normal. Pupils are equal, round, and reactive to light.  Neck: Normal range of motion. Neck supple. No thyromegaly present.  Cardiovascular: Normal rate, regular rhythm and normal heart sounds.  Exam reveals no gallop and no friction rub.   No murmur heard. Pulmonary/Chest: Effort normal and breath sounds normal. No respiratory distress. She has no wheezes.  Abdominal: Soft. Bowel sounds are normal. She exhibits no distension and no mass. There is no tenderness. There is no rebound and no guarding.  Musculoskeletal: Normal range of motion.  + straight leg on right side, + right SI tenderness, negative homen's sign, no edema, erythema  Lymphadenopathy:    She has no cervical adenopathy.  Neurological: She is alert and oriented to person, place, and time. She has normal strength. No cranial nerve deficit. Coordination normal.  Reflex Scores:      Patellar reflexes are  1+ on the right side and 2+ on the left side.      Achilles reflexes are 2+ on the right side and 2+ on the left side. Skin: Skin is warm and dry.  + ecchymosis on medial calf, no erythema, warmth, edema.   Psychiatric: She has a normal mood and affect.       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

## 2014-01-15 ENCOUNTER — Encounter: Payer: Self-pay | Admitting: Physician Assistant

## 2014-01-15 ENCOUNTER — Other Ambulatory Visit: Payer: Self-pay

## 2014-01-15 ENCOUNTER — Ambulatory Visit (INDEPENDENT_AMBULATORY_CARE_PROVIDER_SITE_OTHER): Payer: 59 | Admitting: Physician Assistant

## 2014-01-15 VITALS — BP 122/72 | HR 80 | Temp 98.1°F | Resp 16 | Wt 202.0 lb

## 2014-01-15 DIAGNOSIS — N3000 Acute cystitis without hematuria: Secondary | ICD-10-CM

## 2014-01-15 DIAGNOSIS — R1032 Left lower quadrant pain: Secondary | ICD-10-CM

## 2014-01-15 DIAGNOSIS — N912 Amenorrhea, unspecified: Secondary | ICD-10-CM

## 2014-01-15 DIAGNOSIS — R109 Unspecified abdominal pain: Secondary | ICD-10-CM

## 2014-01-15 LAB — BASIC METABOLIC PANEL WITH GFR
BUN: 16 mg/dL (ref 6–23)
CHLORIDE: 103 meq/L (ref 96–112)
CO2: 32 meq/L (ref 19–32)
Calcium: 9.7 mg/dL (ref 8.4–10.5)
Creat: 0.78 mg/dL (ref 0.50–1.10)
GFR, Est African American: >89 mL/min
GFR, Est Non African American: >89 mL/min
GLUCOSE: 98 mg/dL (ref 70–99)
POTASSIUM: 4.2 meq/L (ref 3.5–5.3)
SODIUM: 140 meq/L (ref 135–145)

## 2014-01-15 LAB — HEPATIC FUNCTION PANEL
ALT: 18 U/L (ref 0–35)
AST: 18 U/L (ref 0–37)
Albumin: 4.6 g/dL (ref 3.5–5.2)
Alkaline Phosphatase: 68 U/L (ref 39–117)
BILIRUBIN DIRECT: 0.1 mg/dL (ref 0.0–0.3)
Indirect Bilirubin: 0.4 mg/dL (ref 0.2–1.2)
Total Bilirubin: 0.5 mg/dL (ref 0.2–1.2)
Total Protein: 7.3 g/dL (ref 6.0–8.3)

## 2014-01-15 LAB — CBC WITH DIFFERENTIAL/PLATELET
Basophils Absolute: 0 10*3/uL (ref 0.0–0.1)
Basophils Relative: 1 % (ref 0–1)
Eosinophils Absolute: 0.1 10*3/uL (ref 0.0–0.7)
Eosinophils Relative: 2 % (ref 0–5)
HCT: 39.5 % (ref 36.0–46.0)
HEMOGLOBIN: 13.1 g/dL (ref 12.0–15.0)
LYMPHS PCT: 53 % — AB (ref 12–46)
Lymphs Abs: 2.3 10*3/uL (ref 0.7–4.0)
MCH: 29.8 pg (ref 26.0–34.0)
MCHC: 33.2 g/dL (ref 30.0–36.0)
MCV: 90 fL (ref 78.0–100.0)
MONOS PCT: 8 % (ref 3–12)
Monocytes Absolute: 0.4 10*3/uL (ref 0.1–1.0)
NEUTROS ABS: 1.6 10*3/uL — AB (ref 1.7–7.7)
NEUTROS PCT: 36 % — AB (ref 43–77)
PLATELETS: 275 10*3/uL (ref 150–400)
RBC: 4.39 MIL/uL (ref 3.87–5.11)
RDW: 14 % (ref 11.5–15.5)
WBC: 4.4 10*3/uL (ref 4.0–10.5)

## 2014-01-15 MED ORDER — HYDROCODONE-ACETAMINOPHEN 5-325 MG PO TABS: 1.0000 | ORAL_TABLET | Freq: Four times a day (QID) | ORAL | Status: DC | PRN

## 2014-01-15 MED ORDER — PROMETHAZINE HCL 25 MG PO TABS: ORAL_TABLET | ORAL | Status: DC

## 2014-01-15 MED ORDER — METRONIDAZOLE 500 MG PO TABS: 500.0000 mg | ORAL_TABLET | Freq: Three times a day (TID) | ORAL | Status: DC

## 2014-01-15 MED ORDER — CIPROFLOXACIN HCL 500 MG PO TABS: 500.0000 mg | ORAL_TABLET | Freq: Two times a day (BID) | ORAL | Status: AC

## 2014-01-15 MED ORDER — KETOROLAC TROMETHAMINE 60 MG/2ML IM SOLN
60.0000 mg | Freq: Once | INTRAMUSCULAR | Status: AC
  Administered 2014-01-15: 60 mg via INTRAMUSCULAR

## 2014-01-15 NOTE — Patient Instructions (Signed)
Abdominal Pain, Women °Abdominal (stomach, pelvic, or belly) pain can be caused by many things. It is important to tell your doctor: °· The location of the pain. °· Does it come and go or is it present all the time? °· Are there things that start the pain (eating certain foods, exercise)? °· Are there other symptoms associated with the pain (fever, nausea, vomiting, diarrhea)? °All of this is helpful to know when trying to find the cause of the pain. °CAUSES  °· Stomach: virus or bacteria infection, or ulcer. °· Intestine: appendicitis (inflamed appendix), regional ileitis (Crohn's disease), ulcerative colitis (inflamed colon), irritable bowel syndrome, diverticulitis (inflamed diverticulum of the colon), or cancer of the stomach or intestine. °· Gallbladder disease or stones in the gallbladder. °· Kidney disease, kidney stones, or infection. °· Pancreas infection or cancer. °· Fibromyalgia (pain disorder). °· Diseases of the female organs: °· Uterus: fibroid (non-cancerous) tumors or infection. °· Fallopian tubes: infection or tubal pregnancy. °· Ovary: cysts or tumors. °· Pelvic adhesions (scar tissue). °· Endometriosis (uterus lining tissue growing in the pelvis and on the pelvic organs). °· Pelvic congestion syndrome (female organs filling up with blood just before the menstrual period). °· Pain with the menstrual period. °· Pain with ovulation (producing an egg). °· Pain with an IUD (intrauterine device, birth control) in the uterus. °· Cancer of the female organs. °· Functional pain (pain not caused by a disease, may improve without treatment). °· Psychological pain. °· Depression. °DIAGNOSIS  °Your doctor will decide the seriousness of your pain by doing an examination. °· Blood tests. °· X-rays. °· Ultrasound. °· CT scan (computed tomography, special type of X-ray). °· MRI (magnetic resonance imaging). °· Cultures, for infection. °· Barium enema (dye inserted in the large intestine, to better view it with  X-rays). °· Colonoscopy (looking in intestine with a lighted tube). °· Laparoscopy (minor surgery, looking in abdomen with a lighted tube). °· Major abdominal exploratory surgery (looking in abdomen with a large incision). °TREATMENT  °The treatment will depend on the cause of the pain.  °· Many cases can be observed and treated at home. °· Over-the-counter medicines recommended by your caregiver. °· Prescription medicine. °· Antibiotics, for infection. °· Birth control pills, for painful periods or for ovulation pain. °· Hormone treatment, for endometriosis. °· Nerve blocking injections. °· Physical therapy. °· Antidepressants. °· Counseling with a psychologist or psychiatrist. °· Minor or major surgery. °HOME CARE INSTRUCTIONS  °· Do not take laxatives, unless directed by your caregiver. °· Take over-the-counter pain medicine only if ordered by your caregiver. Do not take aspirin because it can cause an upset stomach or bleeding. °· Try a clear liquid diet (broth or water) as ordered by your caregiver. Slowly move to a bland diet, as tolerated, if the pain is related to the stomach or intestine. °· Have a thermometer and take your temperature several times a day, and record it. °· Bed rest and sleep, if it helps the pain. °· Avoid sexual intercourse, if it causes pain. °· Avoid stressful situations. °· Keep your follow-up appointments and tests, as your caregiver orders. °· If the pain does not go away with medicine or surgery, you may try: °· Acupuncture. °· Relaxation exercises (yoga, meditation). °· Group therapy. °· Counseling. °SEEK MEDICAL CARE IF:  °· You notice certain foods cause stomach pain. °· Your home care treatment is not helping your pain. °· You need stronger pain medicine. °· You want your IUD removed. °· You feel faint or   lightheaded. °· You develop nausea and vomiting. °· You develop a rash. °· You are having side effects or an allergy to your medicine. °SEEK IMMEDIATE MEDICAL CARE IF:  °· Your  pain does not go away or gets worse. °· You have a fever. °· Your pain is felt only in portions of the abdomen. The right side could possibly be appendicitis. The left lower portion of the abdomen could be colitis or diverticulitis. °· You are passing blood in your stools (bright red or black tarry stools, with or without vomiting). °· You have blood in your urine. °· You develop chills, with or without a fever. °· You pass out. °MAKE SURE YOU:  °· Understand these instructions. °· Will watch your condition. °· Will get help right away if you are not doing well or get worse. °Document Released: 09/10/2007 Document Revised: 02/05/2012 Document Reviewed: 09/30/2009 °[employer]® Patient Information ©2014 [employer]. ° °

## 2014-01-15 NOTE — Progress Notes (Signed)
   Subjective:    Patient ID: [name], female    DOB: June 20, 1971, 43 y.o.   MRN: [id number]  Dysuria  Associated symptoms include frequency and urgency. Pertinent negatives include no flank pain.   43 y.o. female for the last week she has been having lower back pain, left flank pain achy at times and then sharp at time, nausea. Worse with lying to standing position, but nothing makes better. Has been taking AZO for 4 days, with no help. Aleve helps some. Getting progressively worse. Mild chills but denies fever. + vaginal white thin non odorous discharge. She has not had menses since Sept/Oct last year, she is sexually active s/p tubal ligation.   Denies dysuria, fever, frequency, vomiting, hematuria, sweats, diarrhea, constipation. No history of stones.    Review of Systems  Constitutional: Negative.   HENT: Negative.   Respiratory: Negative.   Cardiovascular: Negative.   Gastrointestinal: Negative.   Genitourinary: Positive for dysuria, urgency and frequency. Negative for flank pain, decreased urine volume, vaginal bleeding, vaginal discharge, enuresis, genital sores, vaginal pain, menstrual problem and pelvic pain.  Musculoskeletal: Positive for back pain. Negative for arthralgias and gait problem.  Skin: Negative.  Negative for rash.  Neurological: Negative.   Psychiatric/Behavioral: Negative.        Objective:   Physical Exam  Vitals reviewed. Constitutional: She is oriented to person, place, and time. She appears well-developed and well-nourished.  Neck: Normal range of motion. Neck supple.  Cardiovascular: Normal rate and regular rhythm.   Pulmonary/Chest: Effort normal and breath sounds normal.  Abdominal: Soft. Bowel sounds are normal. She exhibits no mass. There is no hepatosplenomegaly, splenomegaly or hepatomegaly. There is tenderness in the left lower quadrant. There is guarding and CVA tenderness (left side). There is no rigidity, no rebound, no tenderness at  McBurney's point and negative Murphy's sign. No hernia.  Neurological: She is alert and oriented to person, place, and time.  Skin: Skin is warm and dry. No rash noted.      Assessment & Plan:  LLQ and flank pain with nausea- ? Kidney stone, ovarian cyst, infection?  Get labs, get CT AB/pelvis with and without Cipro/flagyl Patient drove here- Toradol IM Norco 5 #30 aleve, phenergan  OVER 30 minutes of exam, counseling, chart review, referral performed

## 2014-01-16 ENCOUNTER — Emergency Department (HOSPITAL_COMMUNITY)
Admission: EM | Admit: 2014-01-16 | Discharge: 2014-01-16 | Disposition: A | Discharge status: Home / Self Care | Payer: 59 | Attending: Emergency Medicine | Admitting: Emergency Medicine

## 2014-01-16 ENCOUNTER — Encounter (HOSPITAL_COMMUNITY): Payer: Self-pay | Admitting: Emergency Medicine

## 2014-01-16 ENCOUNTER — Emergency Department (HOSPITAL_COMMUNITY): Payer: 59

## 2014-01-16 DIAGNOSIS — F411 Generalized anxiety disorder: Secondary | ICD-10-CM | POA: Insufficient documentation

## 2014-01-16 DIAGNOSIS — E559 Vitamin D deficiency, unspecified: Secondary | ICD-10-CM | POA: Insufficient documentation

## 2014-01-16 DIAGNOSIS — Z8711 Personal history of peptic ulcer disease: Secondary | ICD-10-CM | POA: Insufficient documentation

## 2014-01-16 DIAGNOSIS — Z86718 Personal history of other venous thrombosis and embolism: Secondary | ICD-10-CM | POA: Insufficient documentation

## 2014-01-16 DIAGNOSIS — E669 Obesity, unspecified: Secondary | ICD-10-CM | POA: Insufficient documentation

## 2014-01-16 DIAGNOSIS — Z860100 Personal history of colon polyps, unspecified: Secondary | ICD-10-CM | POA: Insufficient documentation

## 2014-01-16 DIAGNOSIS — R109 Unspecified abdominal pain: Secondary | ICD-10-CM

## 2014-01-16 DIAGNOSIS — Z862 Personal history of diseases of the blood and blood-forming organs and certain disorders involving the immune mechanism: Secondary | ICD-10-CM | POA: Insufficient documentation

## 2014-01-16 DIAGNOSIS — F3289 Other specified depressive episodes: Secondary | ICD-10-CM | POA: Insufficient documentation

## 2014-01-16 DIAGNOSIS — K219 Gastro-esophageal reflux disease without esophagitis: Secondary | ICD-10-CM | POA: Insufficient documentation

## 2014-01-16 DIAGNOSIS — Z79899 Other long term (current) drug therapy: Secondary | ICD-10-CM | POA: Insufficient documentation

## 2014-01-16 DIAGNOSIS — Z3202 Encounter for pregnancy test, result negative: Secondary | ICD-10-CM | POA: Insufficient documentation

## 2014-01-16 DIAGNOSIS — K589 Irritable bowel syndrome without diarrhea: Secondary | ICD-10-CM | POA: Insufficient documentation

## 2014-01-16 DIAGNOSIS — F329 Major depressive disorder, single episode, unspecified: Secondary | ICD-10-CM | POA: Insufficient documentation

## 2014-01-16 DIAGNOSIS — Z792 Long term (current) use of antibiotics: Secondary | ICD-10-CM | POA: Insufficient documentation

## 2014-01-16 DIAGNOSIS — Z87891 Personal history of nicotine dependence: Secondary | ICD-10-CM | POA: Insufficient documentation

## 2014-01-16 DIAGNOSIS — Z8739 Personal history of other diseases of the musculoskeletal system and connective tissue: Secondary | ICD-10-CM | POA: Insufficient documentation

## 2014-01-16 DIAGNOSIS — R112 Nausea with vomiting, unspecified: Secondary | ICD-10-CM | POA: Insufficient documentation

## 2014-01-16 DIAGNOSIS — R350 Frequency of micturition: Secondary | ICD-10-CM | POA: Insufficient documentation

## 2014-01-16 LAB — COMPREHENSIVE METABOLIC PANEL
ALT: 17 U/L (ref 0–35)
AST: 18 U/L (ref 0–37)
Albumin: 4.1 g/dL (ref 3.5–5.2)
Alkaline Phosphatase: 74 U/L (ref 39–117)
BILIRUBIN TOTAL: 0.4 mg/dL (ref 0.3–1.2)
BUN: 15 mg/dL (ref 6–23)
CHLORIDE: 102 meq/L (ref 96–112)
CO2: 26 mEq/L (ref 19–32)
CREATININE: 0.78 mg/dL (ref 0.50–1.10)
Calcium: 9.7 mg/dL (ref 8.4–10.5)
GFR calc Af Amer: >90 mL/min (ref >90)
GFR calc non Af Amer: >90 mL/min (ref >90)
Glucose, Bld: 101 mg/dL — ABNORMAL HIGH (ref 70–99)
Potassium: 4.3 mEq/L (ref 3.7–5.3)
Sodium: 140 mEq/L (ref 137–147)
Total Protein: 7.3 g/dL (ref 6.0–8.3)

## 2014-01-16 LAB — CBC WITH DIFFERENTIAL/PLATELET
BASOS ABS: 0 10*3/uL (ref 0.0–0.1)
BASOS PCT: 1 % (ref 0–1)
EOS PCT: 2 % (ref 0–5)
Eosinophils Absolute: 0.1 10*3/uL (ref 0.0–0.7)
HEMATOCRIT: 37.3 % (ref 36.0–46.0)
Hemoglobin: 12.4 g/dL (ref 12.0–15.0)
Lymphocytes Relative: 45 % (ref 12–46)
Lymphs Abs: 1.9 10*3/uL (ref 0.7–4.0)
MCH: 29.6 pg (ref 26.0–34.0)
MCHC: 33.2 g/dL (ref 30.0–36.0)
MCV: 89 fL (ref 78.0–100.0)
MONO ABS: 0.4 10*3/uL (ref 0.1–1.0)
Monocytes Relative: 10 % (ref 3–12)
Neutro Abs: 1.7 10*3/uL (ref 1.7–7.7)
Neutrophils Relative %: 42 % — ABNORMAL LOW (ref 43–77)
Platelets: 230 10*3/uL (ref 150–400)
RBC: 4.19 MIL/uL (ref 3.87–5.11)
RDW: 13 % (ref 11.5–15.5)
WBC: 4.1 10*3/uL (ref 4.0–10.5)

## 2014-01-16 LAB — GC/CHLAMYDIA PROBE AMP, URINE
CHLAMYDIA, SWAB/URINE, PCR: NEGATIVE
GC Probe Amp, Urine: NEGATIVE

## 2014-01-16 LAB — URINALYSIS, ROUTINE W REFLEX MICROSCOPIC
Bilirubin Urine: NEGATIVE
Bilirubin Urine: NEGATIVE
GLUCOSE, UA: NEGATIVE mg/dL
Glucose, UA: NEGATIVE mg/dL
Hgb urine dipstick: NEGATIVE
Hgb urine dipstick: NEGATIVE
KETONES UR: NEGATIVE mg/dL
Ketones, ur: NEGATIVE mg/dL
LEUKOCYTES UA: NEGATIVE
LEUKOCYTES UA: NEGATIVE
NITRITE: NEGATIVE
Nitrite: NEGATIVE
PH: 6.5 (ref 5.0–8.0)
PROTEIN: NEGATIVE mg/dL
Protein, ur: NEGATIVE mg/dL
Specific Gravity, Urine: 1.009 (ref 1.005–1.030)
Specific Gravity, Urine: 1.004 — ABNORMAL LOW (ref 1.005–1.030)
UROBILINOGEN UA: 0.2 mg/dL (ref 0.0–1.0)
UROBILINOGEN UA: 0.2 mg/dL (ref 0.0–1.0)
pH: 5.5 (ref 5.0–8.0)

## 2014-01-16 LAB — LIPASE, BLOOD: LIPASE: 40 U/L (ref 11–59)

## 2014-01-16 LAB — PREGNANCY, URINE: Preg Test, Ur: NEGATIVE

## 2014-01-16 MED ORDER — PROMETHAZINE HCL 25 MG/ML IJ SOLN
12.5000 mg | Freq: Once | INTRAMUSCULAR | Status: AC
  Administered 2014-01-16: 12.5 mg via INTRAVENOUS
  Filled 2014-01-16: qty 1

## 2014-01-16 MED ORDER — HYDROMORPHONE HCL PF 1 MG/ML IJ SOLN
1.0000 mg | INTRAMUSCULAR | Status: DC | PRN
  Administered 2014-01-16: 1 mg via INTRAVENOUS
  Filled 2014-01-16: qty 1

## 2014-01-16 MED ORDER — TAMSULOSIN HCL 0.4 MG PO CAPS
0.4000 mg | ORAL_CAPSULE | Freq: Once | ORAL | Status: AC
  Administered 2014-01-16: 0.4 mg via ORAL
  Filled 2014-01-16: qty 1

## 2014-01-16 MED ORDER — DICYCLOMINE HCL 20 MG PO TABS: 20.0000 mg | ORAL_TABLET | Freq: Two times a day (BID) | ORAL | Status: DC

## 2014-01-16 MED ORDER — ONDANSETRON HCL 4 MG/2ML IJ SOLN
4.0000 mg | Freq: Once | INTRAMUSCULAR | Status: AC
  Administered 2014-01-16: 4 mg via INTRAVENOUS
  Filled 2014-01-16: qty 2

## 2014-01-16 MED ORDER — HYDROMORPHONE HCL PF 1 MG/ML IJ SOLN
1.0000 mg | Freq: Once | INTRAMUSCULAR | Status: AC
  Administered 2014-01-16: 1 mg via INTRAVENOUS
  Filled 2014-01-16: qty 1

## 2014-01-16 MED ORDER — HYDROCODONE-ACETAMINOPHEN 5-325 MG PO TABS: 2.0000 | ORAL_TABLET | ORAL | Status: DC | PRN

## 2014-01-16 MED ORDER — KETOROLAC TROMETHAMINE 30 MG/ML IJ SOLN
30.0000 mg | Freq: Once | INTRAMUSCULAR | Status: AC
  Administered 2014-01-16: 30 mg via INTRAVENOUS
  Filled 2014-01-16: qty 2

## 2014-01-16 MED ORDER — ONDANSETRON 4 MG PO TBDP: 4.0000 mg | ORAL_TABLET | Freq: Three times a day (TID) | ORAL | Status: DC | PRN

## 2014-01-16 MED ORDER — GLYCOPYRROLATE 0.2 MG/ML IJ SOLN
0.1000 mg | Freq: Once | INTRAMUSCULAR | Status: AC
  Administered 2014-01-16: 0.1 mg via INTRAVENOUS
  Filled 2014-01-16: qty 1

## 2014-01-16 NOTE — ED Notes (Signed)
Pt reports left sided flank pain x1 week. Went to pcp yesterday, pcp thought pt has kidney stone, CT scheduled for Monday. Today pain 10/10, called pcp but unable to reach pcp. Pt reports left flank pain that now radiates to left side of abdomen and back. Reports nausea. Denies vomiting. No hx of kidneys stones.

## 2014-01-16 NOTE — Discharge Instructions (Signed)
Abdominal Pain, Adult Many things can cause abdominal pain. Usually, abdominal pain is not caused by a disease and will improve without treatment. It can often be observed and treated at home. Your health care provider will do a physical exam and possibly order blood tests and X-rays to help determine the seriousness of your pain. However, in many cases, more time must pass before a clear cause of the pain can be found. Before that point, your health care provider may not know if you need more testing or further treatment. HOME CARE INSTRUCTIONS  Monitor your abdominal pain for any changes. The following actions may help to alleviate any discomfort you are experiencing:  Only take over-the-counter or prescription medicines as directed by your health care provider.  Do not take laxatives unless directed to do so by your health care provider.  Try a clear liquid diet (broth, tea, or water) as directed by your health care provider. Slowly move to a bland diet as tolerated. SEEK MEDICAL CARE IF:  You have unexplained abdominal pain.  You have abdominal pain associated with nausea or diarrhea.  You have pain when you urinate or have a bowel movement.  You experience abdominal pain that wakes you in the night.  You have abdominal pain that is worsened or improved by eating food.  You have abdominal pain that is worsened with eating fatty foods. SEEK IMMEDIATE MEDICAL CARE IF:   Your pain does not go away within 2 hours.  You have a fever.  You keep throwing up (vomiting).  Your pain is felt only in portions of the abdomen, such as the right side or the left lower portion of the abdomen.  You pass bloody or black tarry stools. MAKE SURE YOU:  Understand these instructions.   Will watch your condition.   Will get help right away if you are not doing well or get worse.  Document Released: 08/23/2005 Document Revised: 09/03/2013 Document Reviewed: 07/23/2013 [facility] Patient  Information 2014 [place].  Irritable Bowel Syndrome Irritable Bowel Syndrome (IBS) is caused by a disturbance of normal bowel function. Other terms used are spastic colon, mucous colitis, and irritable colon. It does not require surgery, nor does it lead to cancer. There is no cure for IBS. But with proper diet, stress reduction, and medication, you will find that your problems (symptoms) will gradually disappear or improve. IBS is a common digestive disorder. It usually appears in late adolescence or early adulthood. Women develop it twice as often as men. CAUSES  After food has been digested and absorbed in the small intestine, waste material is moved into the colon (large intestine). In the colon, water and salts are absorbed from the undigested products coming from the small intestine. The remaining residue, or fecal material, is held for elimination. Under normal circumstances, gentle, rhythmic contractions on the bowel walls push the fecal material along the colon towards the rectum. In IBS, however, these contractions are irregular and poorly coordinated. The fecal material is either retained too long, resulting in constipation, or expelled too soon, producing diarrhea. SYMPTOMS  The most common symptom of IBS is pain. It is typically in the lower left side of the belly (abdomen). But it may occur anywhere in the abdomen. It can be felt as heartburn, backache, or even as a dull pain in the arms or shoulders. The pain comes from excessive bowel-muscle spasms and from the buildup of gas and fecal material in the colon. This pain:  Can range from sharp  belly (abdominal) cramps to a dull, continuous ache.  Usually worsens soon after eating.  Is typically relieved by having a bowel movement or passing gas. Abdominal pain is usually accompanied by constipation. But it may also produce diarrhea. The diarrhea typically occurs right after a meal or upon arising in the morning. The stools are  typically soft and watery. They are often flecked with secretions (mucus). Other symptoms of IBS include:  Bloating.  Loss of appetite.  Heartburn.  Feeling sick to your stomach (nausea).  Belching  Vomiting  Gas. IBS may also cause a number of symptoms that are unrelated to the digestive system:  Fatigue.  Headaches.  Anxiety  Shortness of breath  Difficulty in concentrating.  Dizziness. These symptoms tend to come and go. DIAGNOSIS  The symptoms of IBS closely mimic the symptoms of other, more serious digestive disorders. So your caregiver may wish to perform a variety of additional tests to exclude these disorders. He/she wants to be certain of learning what is wrong (diagnosis). The nature and purpose of each test will be explained to you. TREATMENT A number of medications are available to help correct bowel function and/or relieve bowel spasms and abdominal pain. Among the drugs available are:  Mild, non-irritating laxatives for severe constipation and to help restore normal bowel habits.  Specific anti-diarrheal medications to treat severe or prolonged diarrhea.  Anti-spasmodic agents to relieve intestinal cramps.  Your caregiver may also decide to treat you with a mild tranquilizer or sedative during unusually stressful periods in your life. The important thing to remember is that if any drug is prescribed for you, make sure that you take it exactly as directed. Make sure that your caregiver knows how well it worked for you. HOME CARE INSTRUCTIONS   Avoid foods that are high in fat or oils. Some examples XIH:[id number] cream, butter, frankfurters, sausage, and other fatty meats.  Avoid foods that have a laxative effect, such as fruit, fruit juice, and dairy products.  Cut out carbonated drinks, chewing gum, and "gassy" foods, such as beans and cabbage. This may help relieve bloating and belching.  Bran taken with plenty of liquids may help relieve  constipation.  Keep track of what foods seem to trigger your symptoms.  Avoid emotionally charged situations or circumstances that produce anxiety.  Start or continue exercising.  Get plenty of rest and sleep. MAKE SURE YOU:   Understand these instructions.  Will watch your condition.  Will get help right away if you are not doing well or get worse. Document Released: 11/13/2005 Document Revised: 02/05/2012 Document Reviewed: 07/03/2008 [facility] Patient Information 2014 [place].

## 2014-01-16 NOTE — ED Provider Notes (Signed)
CSN: [id number]     Arrival date & time 01/16/14  0912 History   First MD Initiated Contact with Patient 01/16/14 [phone]     Chief Complaint  Patient presents with  . Flank Pain      HPI  The patient presents with left flank pain. No history of UTIs or stones. The sure foot to 3 days. She bought some over-the-counter Anusol. No relief. Seen by her physician yesterday. States she had a urinalysis and that her physician told her she may have a kidney stone. Outpatient CT scan ordered for Monday, 3 days from now. Worsening pain and nausea last night. She presents here.  Past Medical History  Diagnosis Date  . 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 Anxiety   . PUD (peptic ulcer disease)   . Hiatal hernia   . Duodenitis   . Esophagitis   . Vitamin D deficiency   . Fatigue   . Sacroiliac pain   . Depression   . Anemia   . GERD (gastroesophageal reflux disease)   . Obesity   . Gallstones   . Colon polyp 12/25/2011    Tubular adenomatous   Past Surgical History  Procedure Laterality Date  . Tubal ligation    . Breast lumpectomy Left     benign  . Colposcopy  2012    NEG   Family History  Problem Relation Age of Onset  . Aneurysm Mother   . Heart disease Mother   . Hypertension Mother   . Stroke Mother   . Lupus Mother   . Diabetes Maternal Aunt   . Cancer Maternal Aunt   . Colon cancer Neg Hx    History  Substance Use Topics  . Smoking status: Former Smoker -- 0.10 packs/day    Types: Cigarettes    Quit date: 10/01/2013  . Smokeless tobacco: Never Used     Comment: Pt D/C smoking using a nicotine patch  . Alcohol Use: Yes     Comment: occasional glass of wine   OB History   Grav Para Term Preterm Abortions TAB SAB Ect Mult Living   3 2 2  1  1   2      Review of Systems  Constitutional: Negative for fever, chills, diaphoresis, appetite change and fatigue.  HENT: Negative for mouth sores, sore throat and trouble swallowing.   Eyes: Negative for visual  disturbance.  Respiratory: Negative for cough, chest tightness, shortness of breath and wheezing.   Cardiovascular: Negative for chest pain.  Gastrointestinal: Positive for nausea and vomiting. Negative for abdominal pain, diarrhea and abdominal distention.  Endocrine: Negative for polydipsia, polyphagia and polyuria.  Genitourinary: Positive for frequency and flank pain. Negative for dysuria and hematuria.  Musculoskeletal: Negative for gait problem.  Skin: Negative for color change, pallor and rash.  Neurological: Negative for dizziness, syncope, light-headedness and headaches.  Hematological: Does not bruise/bleed easily.  Psychiatric/Behavioral: Negative for behavioral problems and confusion.      Allergies  Review of patient's allergies indicates no known allergies.  Home Medications   Current Outpatient Rx  Name  Route  Sig  Dispense  Refill  . acetaminophen (TYLENOL) 500 MG tablet   Oral   Take 1,000 mg by mouth every 6 (six) hours as needed. pain         . ALPRAZolam (XANAX) 0.25 MG tablet   Oral   Take 0.25 mg by mouth 3 (three) times daily as needed for sleep.         Marland Kitchen  BIOTIN PO   Oral   Take 1 tablet by mouth daily.         Marland Kitchen bismuth subsalicylate (PEPTO BISMOL) 262 MG/15ML suspension   Oral   Take 15 mLs by mouth every 6 (six) hours as needed for indigestion.          . cholecalciferol (VITAMIN D) 1000 UNITS tablet   Oral   Take 3,000 Units by mouth daily.         . ciprofloxacin (CIPRO) 500 MG tablet   Oral   Take 1 tablet (500 mg total) by mouth 2 (two) times daily.   28 tablet   0   . DULoxetine (CYMBALTA) 60 MG capsule      TAKE 1 CAPSULE BY MOUTH ONCE DAILY   30 capsule   1     Dispense as written.   Marland Kitchen ibuprofen (ADVIL,MOTRIN) 200 MG tablet   Oral   Take 400 mg by mouth every 6 (six) hours as needed. pain         . metroNIDAZOLE (FLAGYL) 500 MG tablet   Oral   Take 1 tablet (500 mg total) by mouth 3 (three) times daily.   21  tablet   0   . nicotine (NICODERM CQ - DOSED IN MG/24 HOURS) 14 mg/24hr patch   Transdermal   Place 14 mg onto the skin daily.         . pantoprazole (PROTONIX) 40 MG tablet   Oral   Take 1 tablet (40 mg total) by mouth daily.   90 tablet   3   . polyethylene glycol (MIRALAX / GLYCOLAX) packet   Oral   Take 17 g by mouth daily as needed for mild constipation.         . traMADol (ULTRAM) 50 MG tablet   Oral   Take 50 mg by mouth every 6 (six) hours as needed for moderate pain.         Marland Kitchen dicyclomine (BENTYL) 20 MG tablet   Oral   Take 1 tablet (20 mg total) by mouth 2 (two) times daily.   20 tablet   0   . HYDROcodone-acetaminophen (NORCO/VICODIN) 5-325 MG per tablet   Oral   Take 2 tablets by mouth every 4 (four) hours as needed.   10 tablet   0   . ondansetron (ZOFRAN ODT) 4 MG disintegrating tablet   Oral   Take 1 tablet (4 mg total) by mouth every 8 (eight) hours as needed for nausea.   20 tablet   0   . promethazine (PHENERGAN) 25 MG tablet      1/2-1 pill as needed every 6 hours for nausea   60 tablet   0    BP 130/101  Pulse 87  Temp(Src) 98.5 F (36.9 C) (Oral)  Resp 14  SpO2 97%  LMP 08/13/2013 Physical Exam  Constitutional: She is oriented to person, place, and time. She appears well-developed and well-nourished. No distress.  HENT:  Head: Normocephalic.  Eyes: Conjunctivae are normal. Pupils are equal, round, and reactive to light. No scleral icterus.  Neck: Normal range of motion. Neck supple. No thyromegaly present.  Cardiovascular: Normal rate and regular rhythm.  Exam reveals no gallop and no friction rub.   No murmur heard. Pulmonary/Chest: Effort normal and breath sounds normal. No respiratory distress. She has no wheezes. She has no rales.  Abdominal: Soft. Bowel sounds are normal. She exhibits no distension. There is no tenderness. There is no rebound.  Left flank and left mid abdomen pain. Not reproducibly tender in this area.   Musculoskeletal: Normal range of motion.  Neurological: She is alert and oriented to person, place, and time.  Skin: Skin is warm and dry. No rash noted.  Psychiatric: She has a normal mood and affect. Her behavior is normal.    ED Course  Procedures (including critical care time) Labs Review Labs Reviewed  CBC WITH DIFFERENTIAL - Abnormal; Notable for the following:    Neutrophils Relative % 42 (*)    All other components within normal limits  COMPREHENSIVE METABOLIC PANEL - Abnormal; Notable for the following:    Glucose, Bld 101 (*)    All other components within normal limits  URINALYSIS, ROUTINE W REFLEX MICROSCOPIC - Abnormal; Notable for the following:    Specific Gravity, Urine 1.004 (*)    All other components within normal limits  LIPASE, BLOOD  PREGNANCY, URINE   Imaging Review Ct Abdomen Pelvis Wo Contrast  01/16/2014   CLINICAL DATA:  Left-sided flank pain of 1 week duration.  EXAM: CT ABDOMEN AND PELVIS WITHOUT CONTRAST  TECHNIQUE: Multidetector CT imaging of the abdomen and pelvis was performed following the standard protocol without intravenous contrast.  COMPARISON:  11/18/2011  FINDINGS: Lung bases are clear.  No pleural or pericardial fluid.  The liver has normal appearance without focal lesions or biliary ductal dilatation. There is a calcified gallstone measuring about 6 mm, unchanged since the previous study. No sign of gallbladder inflammation or biliary obstruction. The spleen is normal. The pancreas is normal. The adrenal glands are normal. The right kidney is normal. No cyst, mass, stone or hydronephrosis. The left kidney contains 1.5 mm stone in the lower pole as was seen previously. No sign of hydroureteronephrosis or passing stone. There are multiple phleboliths in the pelvis.  The aorta and IVC are normal. No retroperitoneal mass or adenopathy. No bowel pathology is seen. The appendix is normal. The uterus and adnexal regions are normal. No significant bony  finding.  IMPRESSION: No cause of acute pain identified. 1.5 mm nonobstructing stone in the lower pole of the left kidney. No evidence of passing stone.  5 mm calcified gallstone as seen in 2012 without evidence of obstruction or inflammation.   Electronically Signed   By: Nelson Chimes M.D.   On: 01/16/2014 11:27    EKG Interpretation   None       MDM   Final diagnoses:  Abdominal pain  Irritable bowel    The patient has no cells in her urine that would suggest infection or stone. She is isolated renal stone without signs of hydronephrosis or recent passage of stone. Reassuring blood cell count and hepatobiliary and pancreatic enzymes. She's had multiple prior evaluations and has a working diagnosis with her primary care and GI doctor of  irritable bowel syndrome. Her abdomen is benign here and symptom control and GI followup is all that is indicated at this time. She has had some symptomatic improvement with the medications. Plan:   will be Bentyl, Zofran, limited number of Vicodin #10.    Tanna Furry, MD 01/16/14 1227

## 2014-01-17 LAB — URINE CULTURE
COLONY COUNT: NO GROWTH
Organism ID, Bacteria: NO GROWTH

## 2014-01-19 ENCOUNTER — Other Ambulatory Visit: Payer: 59

## 2014-01-19 LAB — POCT URINE PREGNANCY: Preg Test, Ur: NEGATIVE

## 2014-02-11 ENCOUNTER — Encounter (HOSPITAL_COMMUNITY): Payer: Self-pay | Admitting: Emergency Medicine

## 2014-02-11 ENCOUNTER — Other Ambulatory Visit: Payer: Self-pay | Admitting: Physician Assistant

## 2014-02-11 ENCOUNTER — Emergency Department (HOSPITAL_COMMUNITY)
Admission: EM | Admit: 2014-02-11 | Discharge: 2014-02-12 | Disposition: A | Discharge status: Home / Self Care | Payer: 59 | Attending: Emergency Medicine | Admitting: Emergency Medicine

## 2014-02-11 ENCOUNTER — Emergency Department (HOSPITAL_COMMUNITY): Payer: 59

## 2014-02-11 DIAGNOSIS — K8020 Calculus of gallbladder without cholecystitis without obstruction: Secondary | ICD-10-CM | POA: Insufficient documentation

## 2014-02-11 DIAGNOSIS — F17200 Nicotine dependence, unspecified, uncomplicated: Secondary | ICD-10-CM | POA: Insufficient documentation

## 2014-02-11 DIAGNOSIS — Z86718 Personal history of other venous thrombosis and embolism: Secondary | ICD-10-CM | POA: Insufficient documentation

## 2014-02-11 DIAGNOSIS — F3289 Other specified depressive episodes: Secondary | ICD-10-CM | POA: Insufficient documentation

## 2014-02-11 DIAGNOSIS — Z860100 Personal history of colon polyps, unspecified: Secondary | ICD-10-CM | POA: Insufficient documentation

## 2014-02-11 DIAGNOSIS — Z862 Personal history of diseases of the blood and blood-forming organs and certain disorders involving the immune mechanism: Secondary | ICD-10-CM | POA: Insufficient documentation

## 2014-02-11 DIAGNOSIS — Z79899 Other long term (current) drug therapy: Secondary | ICD-10-CM | POA: Insufficient documentation

## 2014-02-11 DIAGNOSIS — F329 Major depressive disorder, single episode, unspecified: Secondary | ICD-10-CM | POA: Insufficient documentation

## 2014-02-11 DIAGNOSIS — Z3202 Encounter for pregnancy test, result negative: Secondary | ICD-10-CM | POA: Insufficient documentation

## 2014-02-11 DIAGNOSIS — Z9851 Tubal ligation status: Secondary | ICD-10-CM | POA: Insufficient documentation

## 2014-02-11 DIAGNOSIS — E669 Obesity, unspecified: Secondary | ICD-10-CM | POA: Insufficient documentation

## 2014-02-11 DIAGNOSIS — Z8711 Personal history of peptic ulcer disease: Secondary | ICD-10-CM | POA: Insufficient documentation

## 2014-02-11 DIAGNOSIS — K219 Gastro-esophageal reflux disease without esophagitis: Secondary | ICD-10-CM | POA: Insufficient documentation

## 2014-02-11 LAB — POC URINE PREG, ED: Preg Test, Ur: NEGATIVE

## 2014-02-11 MED ORDER — HYDROMORPHONE HCL PF 1 MG/ML IJ SOLN
0.5000 mg | Freq: Once | INTRAMUSCULAR | Status: AC
  Administered 2014-02-12: 0.5 mg via INTRAVENOUS
  Filled 2014-02-11: qty 1

## 2014-02-11 MED ORDER — ONDANSETRON HCL 4 MG/2ML IJ SOLN
4.0000 mg | Freq: Once | INTRAMUSCULAR | Status: AC
  Administered 2014-02-12: 4 mg via INTRAVENOUS
  Filled 2014-02-11: qty 2

## 2014-02-11 NOTE — ED Notes (Signed)
Pt states she is having pain in her right upper quadrant that she noticed on Monday states it was still there yesterday but got worse today  Pt states she has nausea and belching but has not vomited as of yet  Pt has been told she had a gallstone in the recent past

## 2014-02-11 NOTE — ED Provider Notes (Signed)
CSN: [id number]     Arrival date & time 02/11/14  2153 History   First MD Initiated Contact with Patient 02/11/14 2302     Chief Complaint  Patient presents with  . Abdominal Pain     (Consider location/radiation/quality/duration/timing/severity/associated sxs/prior Treatment) HPI Rebecca [name] is a 43 y.o. female who presents to emergency department complaining of abdominal pain. Patient states that her pain began 3 days ago. States pain is is in the right upper quadrant radiating to the back. Admits to associated nausea no vomiting. Denies any urinary symptoms. Denies any changes in bowels. Last bowel movement today and normal. States took ibuprofen for pain today which did not help. States pain has worsened tonight so she came to emergency department. She states she was diagnosed in the past with peptic ulcer disease, hiatal hernia, esophagitis, duodenitis, and cholelithiasis. She however, denies any similar pain in the past. She denies any fever or chills. No other complaints.  Past Medical History  Diagnosis Date  . 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 PUD (peptic ulcer disease)   . Hiatal hernia   . Duodenitis   . Esophagitis   . Vitamin D deficiency   . Fatigue   . Sacroiliac pain   . Depression   . Anemia   . GERD (gastroesophageal reflux disease)   . Obesity   . Gallstones   . Colon polyp 12/25/2011    Tubular adenomatous   Past Surgical History  Procedure Laterality Date  . Tubal ligation    . Breast lumpectomy Left     benign  . Colposcopy  2012    NEG   Family History  Problem Relation Age of Onset  . Aneurysm Mother   . Heart disease Mother   . Hypertension Mother   . Stroke Mother   . Lupus Mother   . Diabetes Maternal Aunt   . Cancer Maternal Aunt   . Colon cancer Neg Hx    History  Substance Use Topics  . Smoking status: Current Some Day Smoker -- 0.10 packs/day    Types: Cigarettes    Last Attempt to Quit: 10/01/2013  . Smokeless  tobacco: Never Used     Comment: Pt D/C smoking using a nicotine patch  . Alcohol Use: Yes     Comment: occasional glass of wine   OB History   Grav Para Term Preterm Abortions TAB SAB Ect Mult Living   3 2 2  1  1   2      Review of Systems  Constitutional: Negative for fever and chills.  Respiratory: Negative for cough, chest tightness and shortness of breath.   Cardiovascular: Negative for chest pain, palpitations and leg swelling.  Gastrointestinal: Positive for nausea and abdominal pain. Negative for vomiting, diarrhea and constipation.  Genitourinary: Positive for flank pain. Negative for dysuria, vaginal bleeding, vaginal discharge, vaginal pain and pelvic pain.  Musculoskeletal: Negative for arthralgias, myalgias, neck pain and neck stiffness.  Skin: Negative for rash.  Neurological: Negative for dizziness, weakness and headaches.  All other systems reviewed and are negative.      Allergies  Review of patient's allergies indicates no known allergies.  Home Medications   Current Outpatient Rx  Name  Route  Sig  Dispense  Refill  . acetaminophen (TYLENOL) 500 MG tablet   Oral   Take 1,000 mg by mouth every 6 (six) hours as needed. pain         . ALPRAZolam (XANAX) 0.25 MG tablet  Oral   Take 0.25 mg by mouth 3 (three) times daily as needed for sleep.         Marland Kitchen BIOTIN PO   Oral   Take 1 tablet by mouth daily.         Marland Kitchen bismuth subsalicylate (PEPTO BISMOL) 262 MG/15ML suspension   Oral   Take 15 mLs by mouth every 6 (six) hours as needed for indigestion.          . cholecalciferol (VITAMIN D) 1000 UNITS tablet   Oral   Take 1,000 Units by mouth daily.          . DULoxetine (CYMBALTA) 60 MG capsule      TAKE 1 CAPSULE BY MOUTH ONCE DAILY   30 capsule   1   . ibuprofen (ADVIL,MOTRIN) 200 MG tablet   Oral   Take 600 mg by mouth every 6 (six) hours as needed (pain). pain         . nicotine (NICODERM CQ - DOSED IN MG/24 HOURS) 14 mg/24hr  patch   Transdermal   Place 14 mg onto the skin daily.         . ondansetron (ZOFRAN ODT) 4 MG disintegrating tablet   Oral   Take 1 tablet (4 mg total) by mouth every 8 (eight) hours as needed for nausea.   20 tablet   0   . pantoprazole (PROTONIX) 40 MG tablet   Oral   Take 1 tablet (40 mg total) by mouth daily.   90 tablet   3   . traMADol (ULTRAM) 50 MG tablet   Oral   Take 50 mg by mouth every 6 (six) hours as needed for moderate pain.          BP 121/103  Pulse 98  Temp(Src) 98.5 F (36.9 C) (Oral)  Resp 14  SpO2 100%  LMP 08/13/2013 Physical Exam  Nursing note and vitals reviewed. Constitutional: She appears well-developed and well-nourished. No distress.  HENT:  Head: Normocephalic.  Eyes: Conjunctivae are normal.  Neck: Neck supple.  Cardiovascular: Normal rate, regular rhythm and normal heart sounds.   Pulmonary/Chest: Effort normal and breath sounds normal. No respiratory distress. She has no wheezes. She has no rales.  Abdominal: Soft. Bowel sounds are normal. She exhibits no distension. There is tenderness. There is no rebound.  RUQ tenderness. No cva tenderness  Musculoskeletal: She exhibits no edema.  Neurological: She is alert.  Skin: Skin is warm and dry.  Psychiatric: She has a normal mood and affect. Her behavior is normal.    ED Course  Procedures (including critical care time) Labs Review Labs Reviewed  COMPREHENSIVE METABOLIC PANEL - Abnormal; Notable for the following:    Glucose, Bld 105 (*)    GFR calc non Af Amer 88 (*)    All other components within normal limits  URINE CULTURE  CBC WITH DIFFERENTIAL  LIPASE, BLOOD  URINALYSIS, ROUTINE W REFLEX MICROSCOPIC  POC URINE PREG, ED   Imaging Review US Abdomen Complete  02/12/2014   CLINICAL DATA:  Abdominal pain.  EXAM: ULTRASOUND ABDOMEN COMPLETE  COMPARISON:  CT abdomen and pelvis 01/16/2014 and abdominal ultrasound 05/21/2012.  FINDINGS: Gallbladder:  2-3 subcentimeter mobile  gallstones are identified. There is no pericholecystic fluid or wall thickening. Sonographer reports negative Murphy's sign.  Common bile duct:  Diameter: 0.3 cm.  Liver:  No focal lesion identified. Within normal limits in parenchymal echogenicity.  IVC:  No abnormality visualized.  Pancreas:  Visualized portion unremarkable.  Spleen:  Size and appearance within normal limits.  Right Kidney:  Length: 10.0 cm. Echogenicity within normal limits. No mass or hydronephrosis visualized.  Left Kidney:  Length: 11.6 cm. No mass or hydronephrosis. Cortical echogenicity is unremarkable. Tiny nonobstructing stone in the lower pole seen on the comparison CT scan is not visible on this exam.  Abdominal aorta:  No aneurysm visualized.  Other findings:  None.  IMPRESSION: 2-3 subcentimeter gallstones are identified but there is no evidence of cholecystitis. No acute abnormality identified.  Punctate nonobstructing stone lower pole left kidney seen on comparison CT scan is not visualized on this study.   Electronically Signed   By: Inge Rise M.D.   On: 02/12/2014 01:02     EKG Interpretation None      MDM   Final diagnoses:  Cholelithiasis    Pt with upper abdominal pain, RUQ tenderness. Hx of cholelithiasis. Will get labs. US abdomen to r/o cholecystitis. Recent normal CT abd/pelvis.    1:24 AM Pt not improved with 0.5mg  dilaudid and 4mg  zofran. US showing gallstones, no other findings. LFTs and Lipase normal. Will try more pain medications. Discussed results with pt. Plan to manage pain, discussed outpatient follow up with general surgery. Abdomen otherwise soft. Do not think acute abdomen.    1:56 AM Pt feeling better. Tolerating PO fluids. Suspect pain most likely due to a biliary colic. Given referral to general surgery. She is to follow up with them as soon as able.   Filed Vitals:   02/11/14 2205  BP: 121/103  Pulse: 98  Temp: 98.5 F (36.9 C)  TempSrc: Oral  Resp: 14  SpO2: 100%      Que Meneely A Vertis Scheib, PA-C 02/12/14 0157

## 2014-02-12 LAB — CBC WITH DIFFERENTIAL/PLATELET
BASOS PCT: 1 % (ref 0–1)
Basophils Absolute: 0 10*3/uL (ref 0.0–0.1)
EOS ABS: 0.1 10*3/uL (ref 0.0–0.7)
Eosinophils Relative: 2 % (ref 0–5)
HCT: 37.3 % (ref 36.0–46.0)
Hemoglobin: 12.2 g/dL (ref 12.0–15.0)
Lymphocytes Relative: 37 % (ref 12–46)
Lymphs Abs: 2.4 10*3/uL (ref 0.7–4.0)
MCH: 29.3 pg (ref 26.0–34.0)
MCHC: 32.7 g/dL (ref 30.0–36.0)
MCV: 89.4 fL (ref 78.0–100.0)
Monocytes Absolute: 0.7 10*3/uL (ref 0.1–1.0)
Monocytes Relative: 10 % (ref 3–12)
Neutro Abs: 3.3 10*3/uL (ref 1.7–7.7)
Neutrophils Relative %: 51 % (ref 43–77)
PLATELETS: 222 10*3/uL (ref 150–400)
RBC: 4.17 MIL/uL (ref 3.87–5.11)
RDW: 12.8 % (ref 11.5–15.5)
WBC: 6.5 10*3/uL (ref 4.0–10.5)

## 2014-02-12 LAB — URINALYSIS, ROUTINE W REFLEX MICROSCOPIC
BILIRUBIN URINE: NEGATIVE
Glucose, UA: NEGATIVE mg/dL
Hgb urine dipstick: NEGATIVE
Ketones, ur: NEGATIVE mg/dL
LEUKOCYTES UA: NEGATIVE
Nitrite: NEGATIVE
PROTEIN: NEGATIVE mg/dL
Specific Gravity, Urine: 1.007 (ref 1.005–1.030)
Urobilinogen, UA: 0.2 mg/dL (ref 0.0–1.0)
pH: 6 (ref 5.0–8.0)

## 2014-02-12 LAB — COMPREHENSIVE METABOLIC PANEL
ALBUMIN: 4 g/dL (ref 3.5–5.2)
ALK PHOS: 78 U/L (ref 39–117)
ALT: 19 U/L (ref 0–35)
AST: 18 U/L (ref 0–37)
BUN: 20 mg/dL (ref 6–23)
CALCIUM: 9.8 mg/dL (ref 8.4–10.5)
CO2: 24 mEq/L (ref 19–32)
Chloride: 100 mEq/L (ref 96–112)
Creatinine, Ser: 0.8 mg/dL (ref 0.50–1.10)
GFR calc Af Amer: >90 mL/min (ref >90)
GFR calc non Af Amer: 88 mL/min — ABNORMAL LOW (ref >90)
Glucose, Bld: 105 mg/dL — ABNORMAL HIGH (ref 70–99)
POTASSIUM: 4.3 meq/L (ref 3.7–5.3)
SODIUM: 138 meq/L (ref 137–147)
TOTAL PROTEIN: 7.4 g/dL (ref 6.0–8.3)
Total Bilirubin: 0.3 mg/dL (ref 0.3–1.2)

## 2014-02-12 LAB — LIPASE, BLOOD: Lipase: 48 U/L (ref 11–59)

## 2014-02-12 MED ORDER — HYDROMORPHONE HCL PF 1 MG/ML IJ SOLN
0.5000 mg | Freq: Once | INTRAMUSCULAR | Status: AC
  Administered 2014-02-12: 0.5 mg via INTRAVENOUS
  Filled 2014-02-12: qty 1

## 2014-02-12 MED ORDER — OXYCODONE-ACETAMINOPHEN 5-325 MG PO TABS: 1.0000 | ORAL_TABLET | ORAL | Status: DC | PRN

## 2014-02-12 MED ORDER — ONDANSETRON HCL 4 MG/2ML IJ SOLN
4.0000 mg | Freq: Once | INTRAMUSCULAR | Status: AC
  Administered 2014-02-12: 4 mg via INTRAVENOUS
  Filled 2014-02-12: qty 2

## 2014-02-12 MED ORDER — ONDANSETRON HCL 8 MG PO TABS: 8.0000 mg | ORAL_TABLET | Freq: Three times a day (TID) | ORAL | Status: DC | PRN

## 2014-02-12 NOTE — ED Provider Notes (Signed)
Medical screening examination/treatment/procedure(s) were performed by non-physician practitioner and as supervising physician I was immediately available for consultation/collaboration.    Johnna Acosta, MD 02/12/14 [phone]

## 2014-02-12 NOTE — Discharge Instructions (Signed)
You do have gallstones in your gallbladder which could be the cause of your pain. Your blood work appears normal. Please call and follow up with general surgery as referred. Return if worsening. Pain medications and zofran for nausea as prescribed.     Biliary Colic  Biliary colic is a steady or irregular pain in the upper abdomen. It is usually under the right side of the rib cage. It happens when gallstones interfere with the normal flow of bile from the gallbladder. Bile is a liquid that helps to digest fats. Bile is made in the liver and stored in the gallbladder. When you eat a meal, bile passes from the gallbladder through the cystic duct and the common bile duct into the small intestine. There, it mixes with partially digested food. If a gallstone blocks either of these ducts, the normal flow of bile is blocked. The muscle cells in the bile duct contract forcefully to try to move the stone. This causes the pain of biliary colic.  SYMPTOMS   A person with biliary colic usually complains of pain in the upper abdomen. This pain can be:  In the center of the upper abdomen just below the breastbone.  In the upper-right part of the abdomen, near the gallbladder and liver.  Spread back toward the right shoulder blade.  Nausea and vomiting.  The pain usually occurs after eating.  Biliary colic is usually triggered by the digestive system's demand for bile. The demand for bile is high after fatty meals. Symptoms can also occur when a person who has been fasting suddenly eats a very large meal. Most episodes of biliary colic pass after 1 to 5 hours. After the most intense pain passes, your abdomen may continue to ache mildly for about 24 hours. DIAGNOSIS  After you describe your symptoms, your caregiver will perform a physical exam. He or she will pay attention to the upper right portion of your belly (abdomen). This is the area of your liver and gallbladder. An ultrasound will help your caregiver  look for gallstones. Specialized scans of the gallbladder may also be done. Blood tests may be done, especially if you have fever or if your pain persists. PREVENTION  Biliary colic can be prevented by controlling the risk factors for gallstones. Some of these risk factors, such as heredity, increasing age, and pregnancy are a normal part of life. Obesity and a high-fat diet are risk factors you can change through a healthy lifestyle. Women going through menopause who take hormone replacement therapy (estrogen) are also more likely to develop biliary colic. TREATMENT   Pain medication may be prescribed.  You may be encouraged to eat a fat-free diet.  If the first episode of biliary colic is severe, or episodes of colic keep retuning, surgery to remove the gallbladder (cholecystectomy) is usually recommended. This procedure can be done through small incisions using an instrument called a laparoscope. The procedure often requires a brief stay in the hospital. Some people can leave the hospital the same day. It is the most widely used treatment in people troubled by painful gallstones. It is effective and safe, with no complications in more than 90% of cases.  If surgery cannot be done, medication that dissolves gallstones may be used. This medication is expensive and can take months or years to work. Only small stones will dissolve.  Rarely, medication to dissolve gallstones is combined with a procedure called shock-wave lithotripsy. This procedure uses carefully aimed shock waves to break up gallstones. In  many people treated with this procedure, gallstones form again within a few years. PROGNOSIS  If gallstones block your cystic duct or common bile duct, you are at risk for repeated episodes of biliary colic. There is also a 25% chance that you will develop a gallbladder infection(acute cholecystitis), or some other complication of gallstones within 10 to 20 years. If you have surgery, schedule it at  a time that is convenient for you and at a time when you are not sick. HOME CARE INSTRUCTIONS   Drink plenty of clear fluids.  Avoid fatty, greasy or fried foods, or any foods that make your pain worse.  Take medications as directed. SEEK MEDICAL CARE IF:   You develop a fever over 100.5 F (38.1 C).  Your pain gets worse over time.  You develop nausea that prevents you from eating and drinking.  You develop vomiting. SEEK IMMEDIATE MEDICAL CARE IF:   You have continuous or severe belly (abdominal) pain which is not relieved with medications.  You develop nausea and vomiting which is not relieved with medications.  You have symptoms of biliary colic and you suddenly develop a fever and shaking chills. This may signal cholecystitis. Call your caregiver immediately.  You develop a yellow color to your skin or the white part of your eyes (jaundice). Document Released: 04/16/2006 Document Revised: 02/05/2012 Document Reviewed: 06/25/2008 [facility] Patient Information 2014 [place].

## 2014-02-13 LAB — URINE CULTURE
COLONY COUNT: NO GROWTH
CULTURE: NO GROWTH

## 2014-02-17 ENCOUNTER — Encounter (INDEPENDENT_AMBULATORY_CARE_PROVIDER_SITE_OTHER): Payer: Self-pay | Admitting: General Surgery

## 2014-02-17 ENCOUNTER — Ambulatory Visit (INDEPENDENT_AMBULATORY_CARE_PROVIDER_SITE_OTHER): Payer: Commercial Managed Care - PPO | Admitting: General Surgery

## 2014-02-17 VITALS — BP 110/78 | HR 82 | Temp 97.3°F | Resp 14 | Ht 66.5 in | Wt 201.6 lb

## 2014-02-17 DIAGNOSIS — K8020 Calculus of gallbladder without cholecystitis without obstruction: Secondary | ICD-10-CM

## 2014-02-17 NOTE — Progress Notes (Signed)
Chief Complaint  Patient presents with  . New Evaluation    eval choleithiasis    HISTORY: Pt is a 43 yo F seen in consultation at the request of Dr. Noemi Chapel for symptomatic cholelithiasis.  She has had 2 episodes of severe pain in the last month.  She states that she had severe pain in the right side and going to the back.  She had severe nausea associated with this.  She has some soreness that is chronic as well.  She has not associated these episodes with food.  She has no known family member with gallbladder disease.  She had to go to the ED twice.    Past Medical History  Diagnosis Date  . 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 PUD (peptic ulcer disease)   . Hiatal hernia   . Duodenitis   . Esophagitis   . Vitamin D deficiency   . Fatigue   . Sacroiliac pain   . Depression   . Anemia   . GERD (gastroesophageal reflux disease)   . Obesity   . Gallstones   . Colon polyp 12/25/2011    Tubular adenomatous    Past Surgical History  Procedure Laterality Date  . Tubal ligation    . Breast lumpectomy Left     benign  . Colposcopy  2012    NEG    Current Outpatient Prescriptions  Medication Sig Dispense Refill  . acetaminophen (TYLENOL) 500 MG tablet Take 1,000 mg by mouth every 6 (six) hours as needed. pain      . ALPRAZolam (XANAX) 0.25 MG tablet Take 0.25 mg by mouth 3 (three) times daily as needed for sleep.      Marland Kitchen BIOTIN PO Take 1 tablet by mouth daily.      Marland Kitchen bismuth subsalicylate (PEPTO BISMOL) 262 MG/15ML suspension Take 15 mLs by mouth every 6 (six) hours as needed for indigestion.       . cholecalciferol (VITAMIN D) 1000 UNITS tablet Take 1,000 Units by mouth daily.       . DULoxetine (CYMBALTA) 60 MG capsule TAKE 1 CAPSULE BY MOUTH ONCE DAILY  30 capsule  1  . ibuprofen (ADVIL,MOTRIN) 200 MG tablet Take 600 mg by mouth every 6 (six) hours as needed (pain). pain      . nicotine (NICODERM CQ - DOSED IN MG/24 HOURS) 14 mg/24hr patch Place 14 mg onto the  skin daily.      . ondansetron (ZOFRAN) 8 MG tablet Take 1 tablet (8 mg total) by mouth every 8 (eight) hours as needed for nausea or vomiting.  20 tablet  0  . pantoprazole (PROTONIX) 40 MG tablet Take 1 tablet (40 mg total) by mouth daily.  90 tablet  3  . traMADol (ULTRAM) 50 MG tablet Take 50 mg by mouth every 6 (six) hours as needed for moderate pain.       No current facility-administered medications for this visit.     No Known Allergies   Family History  Problem Relation Age of Onset  . Aneurysm Mother   . Heart disease Mother   . Hypertension Mother   . Stroke Mother   . Lupus Mother   . Diabetes Maternal Aunt   . Cancer Maternal Aunt   . Colon cancer Neg Hx      History   Social History  . Marital Status: Married    Spouse Name: N/A    Number of Children: 2  . Years of Education:  N/A   Occupational History  . [employer]   Social History Main Topics  . Smoking status: Current Some Day Smoker -- 0.10 packs/day    Types: Cigarettes    Last Attempt to Quit: 10/01/2013  . Smokeless tobacco: Never Used     Comment: Pt D/C smoking using a nicotine patch  . Alcohol Use: Yes     Comment: occasional glass of wine  . Drug Use: No  . Sexual Activity: None   Other Topics Concern  . None   Social History Narrative  . None     REVIEW OF SYSTEMS - PERTINENT POSITIVES ONLY: 12 point review of systems negative other than HPI and PMH except for bloating.    EXAM: Filed Vitals:   02/17/14 1339  BP: 110/78  Pulse: 82  Temp: 97.3 F (36.3 C)  Resp: 14    Wt Readings from Last 3 Encounters:  02/17/14 201 lb 9.6 oz (91.445 kg)  01/15/14 202 lb (91.627 kg)  11/13/13 196 lb (88.905 kg)     Gen:  No acute distress.  Well nourished and well groomed.   Neurological: Alert and oriented to person, place, and time. Coordination normal.  Head: Normocephalic and atraumatic.  Eyes: Conjunctivae are normal. Pupils are equal, round, and reactive to light.  No scleral icterus.  Neck: Normal range of motion. Neck supple. No tracheal deviation or thyromegaly present.  Cardiovascular: Normal rate, regular rhythm,  and intact distal pulses.   Respiratory: Effort normal.  No respiratory distress. No chest wall tenderness.  GI: Soft.  The abdomen is soft and nondistended.  Non tender.  There is no rebound and no guarding.  Musculoskeletal: Normal range of motion. Extremities are nontender.  Lymphadenopathy: No cervical, preauricular, postauricular or axillary adenopathy is present Skin: Skin is warm and dry. No rash noted. No diaphoresis. No erythema. No pallor. No clubbing, cyanosis, or edema.   Psychiatric: Normal mood and affect. Behavior is normal. Judgment and thought content normal.    LABORATORY RESULTS: Available labs are reviewed   Recent Results (from the past 2160 hour(s))  CBC WITH DIFFERENTIAL     Status: Abnormal   Collection Time    01/15/14  3:10 PM      Result Value Ref Range   WBC 4.4  4.0 - 10.5 K/uL   RBC 4.39  3.87 - 5.11 MIL/uL   Hemoglobin 13.1  12.0 - 15.0 g/dL   HCT 39.5  36.0 - 46.0 %   MCV 90.0  78.0 - 100.0 fL   MCH 29.8  26.0 - 34.0 pg   MCHC 33.2  30.0 - 36.0 g/dL   RDW 14.0  11.5 - 15.5 %   Platelets 275  150 - 400 K/uL   Neutrophils Relative % 36 (*) 43 - 77 %   Neutro Abs 1.6 (*) 1.7 - 7.7 K/uL   Lymphocytes Relative 53 (*) 12 - 46 %   Lymphs Abs 2.3  0.7 - 4.0 K/uL   Monocytes Relative 8  3 - 12 %   Monocytes Absolute 0.4  0.1 - 1.0 K/uL   Eosinophils Relative 2  0 - 5 %   Eosinophils Absolute 0.1  0.0 - 0.7 K/uL   Basophils Relative 1  0 - 1 %   Basophils Absolute 0.0  0.0 - 0.1 K/uL   Smear Review Criteria for review not met    BASIC METABOLIC PANEL WITH GFR     Status: None   Collection Time  01/15/14  3:10 PM      Result Value Ref Range   Sodium 140  135 - 145 mEq/L   Potassium 4.2  3.5 - 5.3 mEq/L   Chloride 103  96 - 112 mEq/L   CO2 32  19 - 32 mEq/L   Glucose, Bld 98  70 - 99 mg/dL    BUN 16  6 - 23 mg/dL   Creat 0.78  0.50 - 1.10 mg/dL   Calcium 9.7  8.4 - 10.5 mg/dL   GFR, Est African American >89     GFR, Est Non African American >89     Comment:       The estimated GFR is a calculation valid for adults (>=66 years old)     that uses the CKD-EPI algorithm to adjust for age and sex. It is       not to be used for children, pregnant women, hospitalized patients,        patients on dialysis, or with rapidly changing kidney function.     According to the NKDEP, eGFR >89 is normal, 60-89 shows mild     impairment, 30-59 shows moderate impairment, 15-29 shows severe     impairment and <15 is ESRD.        HEPATIC FUNCTION PANEL     Status: None   Collection Time    01/15/14  3:10 PM      Result Value Ref Range   Total Bilirubin 0.5  0.2 - 1.2 mg/dL   Comment: ** Please note change in reference range(s). **   Bilirubin, Direct 0.1  0.0 - 0.3 mg/dL   Indirect Bilirubin 0.4  0.2 - 1.2 mg/dL   Comment: ** Please note change in reference range(s). **   Alkaline Phosphatase 68  39 - 117 U/L   AST 18  0 - 37 U/L   ALT 18  0 - 35 U/L   Total Protein 7.3  6.0 - 8.3 g/dL   Albumin 4.6  3.5 - 5.2 g/dL  URINALYSIS, ROUTINE W REFLEX MICROSCOPIC     Status: None   Collection Time    01/15/14  3:10 PM      Result Value Ref Range   Color, Urine YELLOW  YELLOW   APPearance CLEAR  CLEAR   Specific Gravity, Urine 1.009  1.005 - 1.030   pH 5.5  5.0 - 8.0   Glucose, UA NEG  NEG mg/dL   Bilirubin Urine NEG  NEG   Ketones, ur NEG  NEG mg/dL   Hgb urine dipstick NEG  NEG   Protein, ur NEG  NEG mg/dL   Urobilinogen, UA 0.2  0.0 - 1.0 mg/dL   Nitrite NEG  NEG   Leukocytes, UA NEG  NEG  URINE CULTURE     Status: None   Collection Time    01/15/14  3:10 PM      Result Value Ref Range   Colony Count NO GROWTH     Organism ID, Bacteria NO GROWTH    GC/CHLAMYDIA PROBE AMP, URINE     Status: None   Collection Time    01/15/14  3:10 PM      Result Value Ref Range   Chlamydia,  Swab/Urine, PCR NEGATIVE  NEGATIVE   Comment:                          **Normal Reference Range: Negative**  Assay performed using the Gen-Probe APTIMA COMBO2 (R) Assay.         GC Probe Amp, Urine NEGATIVE  NEGATIVE   Comment:                          **Normal Reference Range: Negative**                  Assay performed using the Gen-Probe APTIMA COMBO2 (R) Assay.        CBC WITH DIFFERENTIAL     Status: Abnormal   Collection Time    01/16/14 10:25 AM      Result Value Ref Range   WBC 4.1  4.0 - 10.5 K/uL   RBC 4.19  3.87 - 5.11 MIL/uL   Hemoglobin 12.4  12.0 - 15.0 g/dL   HCT 37.3  36.0 - 46.0 %   MCV 89.0  78.0 - 100.0 fL   MCH 29.6  26.0 - 34.0 pg   MCHC 33.2  30.0 - 36.0 g/dL   RDW 13.0  11.5 - 15.5 %   Platelets 230  150 - 400 K/uL   Neutrophils Relative % 42 (*) 43 - 77 %   Neutro Abs 1.7  1.7 - 7.7 K/uL   Lymphocytes Relative 45  12 - 46 %   Lymphs Abs 1.9  0.7 - 4.0 K/uL   Monocytes Relative 10  3 - 12 %   Monocytes Absolute 0.4  0.1 - 1.0 K/uL   Eosinophils Relative 2  0 - 5 %   Eosinophils Absolute 0.1  0.0 - 0.7 K/uL   Basophils Relative 1  0 - 1 %   Basophils Absolute 0.0  0.0 - 0.1 K/uL  COMPREHENSIVE METABOLIC PANEL     Status: Abnormal   Collection Time    01/16/14 10:25 AM      Result Value Ref Range   Sodium 140  137 - 147 mEq/L   Potassium 4.3  3.7 - 5.3 mEq/L   Chloride 102  96 - 112 mEq/L   CO2 26  19 - 32 mEq/L   Glucose, Bld 101 (*) 70 - 99 mg/dL   BUN 15  6 - 23 mg/dL   Creatinine, Ser 0.78  0.50 - 1.10 mg/dL   Calcium 9.7  8.4 - 10.5 mg/dL   Total Protein 7.3  6.0 - 8.3 g/dL   Albumin 4.1  3.5 - 5.2 g/dL   AST 18  0 - 37 U/L   ALT 17  0 - 35 U/L   Alkaline Phosphatase 74  39 - 117 U/L   Total Bilirubin 0.4  0.3 - 1.2 mg/dL   GFR calc non Af Amer >90  >90 mL/min   GFR calc Af Amer >90  >90 mL/min   Comment: (NOTE)     The eGFR has been calculated using the CKD EPI equation.     This calculation has not been validated in  all clinical situations.     eGFR's persistently <90 mL/min signify possible Chronic Kidney     Disease.  LIPASE, BLOOD     Status: None   Collection Time    01/16/14 10:25 AM      Result Value Ref Range   Lipase 40  11 - 59 U/L  URINALYSIS, ROUTINE W REFLEX MICROSCOPIC     Status: Abnormal   Collection Time    01/16/14 10:35 AM      Result Value Ref Range  Color, Urine YELLOW  YELLOW   APPearance CLEAR  CLEAR   Specific Gravity, Urine 1.004 (*) 1.005 - 1.030   pH 6.5  5.0 - 8.0   Glucose, UA NEGATIVE  NEGATIVE mg/dL   Hgb urine dipstick NEGATIVE  NEGATIVE   Bilirubin Urine NEGATIVE  NEGATIVE   Ketones, ur NEGATIVE  NEGATIVE mg/dL   Protein, ur NEGATIVE  NEGATIVE mg/dL   Urobilinogen, UA 0.2  0.0 - 1.0 mg/dL   Nitrite NEGATIVE  NEGATIVE   Leukocytes, UA NEGATIVE  NEGATIVE   Comment: MICROSCOPIC NOT DONE ON URINES WITH NEGATIVE PROTEIN, BLOOD, LEUKOCYTES, NITRITE, OR GLUCOSE <1000 mg/dL.  PREGNANCY, URINE     Status: None   Collection Time    01/16/14 10:35 AM      Result Value Ref Range   Preg Test, Ur NEGATIVE  NEGATIVE   Comment:            THE SENSITIVITY OF THIS     METHODOLOGY IS >20 mIU/mL.  POCT URINE PREGNANCY     Status: Normal   Collection Time    01/19/14  8:26 AM      Result Value Ref Range   Preg Test, Ur Negative    URINE CULTURE     Status: None   Collection Time    02/11/14 11:38 PM      Result Value Ref Range   Specimen Description URINE, CLEAN CATCH     Special Requests NONE     Culture  Setup Time       Value: 02/12/2014 04:54     Performed at [facility]       Value: NO GROWTH     Performed at [facility]   Culture       Value: NO GROWTH     Performed at [facility]   Report Status 02/13/2014 FINAL    URINALYSIS, ROUTINE W REFLEX MICROSCOPIC     Status: None   Collection Time    02/11/14 11:38 PM      Result Value Ref Range   Color, Urine YELLOW  YELLOW   APPearance CLEAR  CLEAR   Specific  Gravity, Urine 1.007  1.005 - 1.030   pH 6.0  5.0 - 8.0   Glucose, UA NEGATIVE  NEGATIVE mg/dL   Hgb urine dipstick NEGATIVE  NEGATIVE   Bilirubin Urine NEGATIVE  NEGATIVE   Ketones, ur NEGATIVE  NEGATIVE mg/dL   Protein, ur NEGATIVE  NEGATIVE mg/dL   Urobilinogen, UA 0.2  0.0 - 1.0 mg/dL   Nitrite NEGATIVE  NEGATIVE   Leukocytes, UA NEGATIVE  NEGATIVE   Comment: MICROSCOPIC NOT DONE ON URINES WITH NEGATIVE PROTEIN, BLOOD, LEUKOCYTES, NITRITE, OR GLUCOSE <1000 mg/dL.  CBC WITH DIFFERENTIAL     Status: None   Collection Time    02/11/14 11:40 PM      Result Value Ref Range   WBC 6.5  4.0 - 10.5 K/uL   RBC 4.17  3.87 - 5.11 MIL/uL   Hemoglobin 12.2  12.0 - 15.0 g/dL   HCT 37.3  36.0 - 46.0 %   MCV 89.4  78.0 - 100.0 fL   MCH 29.3  26.0 - 34.0 pg   MCHC 32.7  30.0 - 36.0 g/dL   RDW 12.8  11.5 - 15.5 %   Platelets 222  150 - 400 K/uL   Neutrophils Relative % 51  43 - 77 %   Neutro Abs 3.3  1.7 -  7.7 K/uL   Lymphocytes Relative 37  12 - 46 %   Lymphs Abs 2.4  0.7 - 4.0 K/uL   Monocytes Relative 10  3 - 12 %   Monocytes Absolute 0.7  0.1 - 1.0 K/uL   Eosinophils Relative 2  0 - 5 %   Eosinophils Absolute 0.1  0.0 - 0.7 K/uL   Basophils Relative 1  0 - 1 %   Basophils Absolute 0.0  0.0 - 0.1 K/uL  COMPREHENSIVE METABOLIC PANEL     Status: Abnormal   Collection Time    02/11/14 11:40 PM      Result Value Ref Range   Sodium 138  137 - 147 mEq/L   Potassium 4.3  3.7 - 5.3 mEq/L   Chloride 100  96 - 112 mEq/L   CO2 24  19 - 32 mEq/L   Glucose, Bld 105 (*) 70 - 99 mg/dL   BUN 20  6 - 23 mg/dL   Creatinine, Ser 9.21  0.50 - 1.10 mg/dL   Calcium 9.8  8.4 - 74.2 mg/dL   Total Protein 7.4  6.0 - 8.3 g/dL   Albumin 4.0  3.5 - 5.2 g/dL   AST 18  0 - 37 U/L   ALT 19  0 - 35 U/L   Alkaline Phosphatase 78  39 - 117 U/L   Total Bilirubin 0.3  0.3 - 1.2 mg/dL   GFR calc non Af Amer 88 (*) >90 mL/min   GFR calc Af Amer >90  >90 mL/min   Comment: (NOTE)     The eGFR has been calculated  using the CKD EPI equation.     This calculation has not been validated in all clinical situations.     eGFR's persistently <90 mL/min signify possible Chronic Kidney     Disease.  LIPASE, BLOOD     Status: None   Collection Time    02/11/14 11:40 PM      Result Value Ref Range   Lipase 48  11 - 59 U/L  POC URINE PREG, ED     Status: None   Collection Time    02/11/14 11:46 PM      Result Value Ref Range   Preg Test, Ur NEGATIVE  NEGATIVE   Comment:            THE SENSITIVITY OF THIS     METHODOLOGY IS >24 mIU/mL     RADIOLOGY RESULTS: See E-Chart or I-Site for most recent results.  Images and reports are reviewed.  US Abdomen Complete  02/12/2014   CLINICAL DATA:  Abdominal pain.  EXAM: ULTRASOUND ABDOMEN COMPLETE  COMPARISON:  CT abdomen and pelvis 01/16/2014 and abdominal ultrasound 05/21/2012.  FINDINGS: Gallbladder:  2-3 subcentimeter mobile gallstones are identified. There is no pericholecystic fluid or wall thickening. Sonographer reports negative Murphy's sign.  Common bile duct:  Diameter: 0.3 cm.  Liver:  No focal lesion identified. Within normal limits in parenchymal echogenicity.  IVC:  No abnormality visualized.  Pancreas:  Visualized portion unremarkable.  Spleen:  Size and appearance within normal limits.  Right Kidney:  Length: 10.0 cm. Echogenicity within normal limits. No mass or hydronephrosis visualized.  Left Kidney:  Length: 11.6 cm. No mass or hydronephrosis. Cortical echogenicity is unremarkable. Tiny nonobstructing stone in the lower pole seen on the comparison CT scan is not visible on this exam.  Abdominal aorta:  No aneurysm visualized.  Other findings:  None.  IMPRESSION: 2-3 subcentimeter gallstones are identified but there  is no evidence of cholecystitis. No acute abnormality identified.  Punctate nonobstructing stone lower pole left kidney seen on comparison CT scan is not visualized on this study.   Electronically Signed   By: Inge Rise M.D.   On:  02/12/2014 01:02      ASSESSMENT AND PLAN: Cholelithiasis The patient is a 43 year old female with biliary colic. Given the fact that she has had 2 episodes the last month, I recommend cholecystectomy.  I did discuss that her sense of bloating may or may not improve.  The surgical procedure was described to the patient in detail.  The patient was given Neurosurgeon. .  I discussed the incision type and location, the location of the gallbladder, the anatomy of the bile ducts and arteries, and the typical progression of surgery.  I discussed the possibility of converting to an open operation.  I advised of the risks of bleeding, infection, damage to other structures (such as the bile duct, intestine or liver), bile leak, need for other procedures or surgeries, and post op diarrhea/constipation.  We discussed the risk of blood clot.  We discussed the recovery period and post operative restrictions.  The patient was advised against taking blood thinners the week before surgery.  I discussed the risk of diarrhea postoperatively.          Milus Height MD Surgical Oncology, [facility]      Visit Diagnoses: 1. Cholelithiasis     Primary Care Physician: Alesia Richards, MD

## 2014-02-17 NOTE — Patient Instructions (Signed)

## 2014-02-17 NOTE — Assessment & Plan Note (Signed)
The patient is a 43 year old female with biliary colic. Given the fact that she has had 2 episodes the last month, I recommend cholecystectomy.  I did discuss that her sense of bloating may or may not improve.  The surgical procedure was described to the patient in detail.  The patient was given Neurosurgeon. .  I discussed the incision type and location, the location of the gallbladder, the anatomy of the bile ducts and arteries, and the typical progression of surgery.  I discussed the possibility of converting to an open operation.  I advised of the risks of bleeding, infection, damage to other structures (such as the bile duct, intestine or liver), bile leak, need for other procedures or surgeries, and post op diarrhea/constipation.  We discussed the risk of blood clot.  We discussed the recovery period and post operative restrictions.  The patient was advised against taking blood thinners the week before surgery.  I discussed the risk of diarrhea postoperatively.

## 2014-04-28 ENCOUNTER — Other Ambulatory Visit: Payer: Self-pay | Admitting: Physician Assistant

## 2014-04-30 ENCOUNTER — Encounter: Payer: Self-pay | Admitting: Emergency Medicine

## 2014-04-30 ENCOUNTER — Ambulatory Visit (INDEPENDENT_AMBULATORY_CARE_PROVIDER_SITE_OTHER): Payer: 59 | Admitting: Emergency Medicine

## 2014-04-30 VITALS — BP 112/78 | HR 74 | Temp 98.4°F | Resp 18 | Ht 67.25 in | Wt 203.0 lb

## 2014-04-30 DIAGNOSIS — R059 Cough, unspecified: Secondary | ICD-10-CM

## 2014-04-30 DIAGNOSIS — R5381 Other malaise: Secondary | ICD-10-CM

## 2014-04-30 DIAGNOSIS — R5383 Other fatigue: Secondary | ICD-10-CM

## 2014-04-30 DIAGNOSIS — R05 Cough: Secondary | ICD-10-CM

## 2014-04-30 DIAGNOSIS — I10 Essential (primary) hypertension: Secondary | ICD-10-CM

## 2014-04-30 DIAGNOSIS — Z0000 Encounter for general adult medical examination without abnormal findings: Secondary | ICD-10-CM

## 2014-04-30 LAB — CBC WITH DIFFERENTIAL/PLATELET
BASOS ABS: 0 10*3/uL (ref 0.0–0.1)
BASOS PCT: 0 % (ref 0–1)
Eosinophils Absolute: 0.2 10*3/uL (ref 0.0–0.7)
Eosinophils Relative: 4 % (ref 0–5)
HEMATOCRIT: 38.4 % (ref 36.0–46.0)
Hemoglobin: 12.8 g/dL (ref 12.0–15.0)
Lymphocytes Relative: 46 % (ref 12–46)
Lymphs Abs: 2 10*3/uL (ref 0.7–4.0)
MCH: 29.7 pg (ref 26.0–34.0)
MCHC: 33.3 g/dL (ref 30.0–36.0)
MCV: 89.1 fL (ref 78.0–100.0)
MONO ABS: 0.4 10*3/uL (ref 0.1–1.0)
Monocytes Relative: 10 % (ref 3–12)
NEUTROS PCT: 40 % — AB (ref 43–77)
Neutro Abs: 1.7 10*3/uL (ref 1.7–7.7)
Platelets: 238 10*3/uL (ref 150–400)
RBC: 4.31 MIL/uL (ref 3.87–5.11)
RDW: 13.6 % (ref 11.5–15.5)
WBC: 4.3 10*3/uL (ref 4.0–10.5)

## 2014-04-30 LAB — HEMOGLOBIN A1C
HEMOGLOBIN A1C: 6 % — AB (ref <5.7)
MEAN PLASMA GLUCOSE: 126 mg/dL — AB (ref <117)

## 2014-04-30 MED ORDER — DULOXETINE HCL 60 MG PO CPEP: 60.0000 mg | ORAL_CAPSULE | Freq: Every day | ORAL | Status: DC

## 2014-04-30 MED ORDER — PANTOPRAZOLE SODIUM 40 MG PO TBEC: 40.0000 mg | DELAYED_RELEASE_TABLET | Freq: Every day | ORAL | Status: DC

## 2014-04-30 NOTE — Progress Notes (Signed)
Subjective:    Patient ID: Rebecca [name], female    DOB: 1971/05/16, 43 y.o.   MRN: [id number]  HPI Comments: 43 yo female CPE. She is doing well overall but notes mild fatigue. She notes + snoring per family.  She denies OSA hx. She notes mood has been well controlled on current RX. She is trying to improve diet and exercise.   She notes GERD is controlled with Protonix and diet and denies any recent flares. She is still smoking. She notes mild cough but thinks allergy related.   WBC             6.5   02/11/2014 HGB            12.2   02/11/2014 HCT            37.3   02/11/2014 PLT             222   02/11/2014 GLUCOSE         105   02/11/2014 CHOL            159   10/06/2013 TRIG             61   10/06/2013 HDL              53   10/06/2013 LDLCALC          94   10/06/2013 ALT              19   02/11/2014 AST              18   02/11/2014 NA              138   02/11/2014 K               4.3   02/11/2014 CL              100   02/11/2014 CREATININE     0.80   02/11/2014 BUN              20   02/11/2014 CO2              24   02/11/2014 TSH           1.155   11/13/2013   Gastrophageal Reflux She complains of coughing. Associated symptoms include fatigue.  Anxiety       Medication List       This list is accurate as of: 04/30/14 11:59 PM.  Always use your most recent med list.               acetaminophen 500 MG tablet  Commonly known as:  TYLENOL  Take 1,000 mg by mouth every 6 (six) hours as needed. pain     ALPRAZolam 0.25 MG tablet  Commonly known as:  XANAX  Take 0.25 mg by mouth 3 (three) times daily as needed for sleep.     BIOTIN PO  Take 1 tablet by mouth daily.     cholecalciferol 1000 UNITS tablet  Commonly known as:  VITAMIN D  Take 1,000 Units by mouth daily.     DULoxetine 60 MG capsule  Commonly known as:  CYMBALTA  Take 1 capsule (60 mg total) by mouth daily.     ibuprofen 200 MG tablet  Commonly known as:  ADVIL,MOTRIN  Take 600 mg by mouth every 6  (six) hours as needed (pain). pain     multivitamin with minerals tablet  Take  1 tablet by mouth daily.     pantoprazole 40 MG tablet  Commonly known as:  PROTONIX  Take 1 tablet (40 mg total) by mouth daily.     traMADol 50 MG tablet  Commonly known as:  ULTRAM  Take 50 mg by mouth every 6 (six) hours as needed for moderate pain.       No Known Allergies  Past Medical History  Diagnosis Date  . 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 PUD (peptic ulcer disease)   . Hiatal hernia   . Duodenitis   . Esophagitis   . Vitamin D deficiency   . Fatigue   . Sacroiliac pain   . Depression   . Anemia   . GERD (gastroesophageal reflux disease)   . Obesity   . Gallstones   . Colon polyp 12/25/2011    Tubular adenomatous   Past Surgical History  Procedure Laterality Date  . Tubal ligation    . Breast lumpectomy Left     benign  . Colposcopy  2012    NEG   History  Substance Use Topics  . Smoking status: Current Some Day Smoker -- 0.10 packs/day    Types: Cigarettes  . Smokeless tobacco: Never Used     Comment: Pt D/C smoking using a nicotine patch  . Alcohol Use: Yes     Comment: occasional glass of wine   Family History  Problem Relation Age of Onset  . Aneurysm Mother   . Heart disease Mother   . Hypertension Mother   . Stroke Mother   . Lupus Mother   . Diabetes Maternal Aunt   . Cancer Maternal Aunt   . Colon cancer Neg Hx    MAINTENANCE: Colonoscopy: 2013 Mammo: 04/23/13 BMD:n/a Pap/ Pelvic:2012 [id number] Glasses Dentist: q 6 month LMP : last year CXR: 01/13/12  IMMUNIZATIONS: [id number] Influenza:2014  Patient Care Team: Unk Pinto, MD as PCP - General (Internal Medicine) Minette Headland, MD as Referring Physician ([place]) Eulis Manly. Gershon Crane, MD as Consulting Physician (Ophthalmology) Jerene Bears, MD as Consulting Physician (Gastroenterology) Marvene Staff, MD as Consulting Physician (Obstetrics and  Gynecology)    Review of Systems  Constitutional: Positive for fatigue.  Respiratory: Positive for cough.   All other systems reviewed and are negative.  BP 112/78  Pulse 74  Temp(Src) 98.4 F (36.9 C) (Temporal)  Resp 18  Ht 5' 7.25" (1.708 m)  Wt 203 lb (92.08 kg)  BMI 31.56 kg/m2     Objective:   Physical Exam  Nursing note and vitals reviewed. Constitutional: She is oriented to person, place, and time. She appears well-developed and well-nourished. No distress.  overweight  HENT:  Head: Normocephalic and atraumatic.  Right Ear: External ear normal.  Left Ear: External ear normal.  Nose: Nose normal.  Mouth/Throat: Oropharynx is clear and moist. No oropharyngeal exudate.  Large tongue partially obstructs airway  Eyes: Conjunctivae and EOM are normal. Pupils are equal, round, and reactive to light. Right eye exhibits no discharge. Left eye exhibits no discharge. No scleral icterus.  Neck: Normal range of motion. Neck supple. No JVD present. No tracheal deviation present. No thyromegaly present.  Cardiovascular: Normal rate, regular rhythm, normal heart sounds and intact distal pulses.   Pulmonary/Chest: Effort normal and breath sounds normal.  Abdominal: Soft. Bowel sounds are normal. She exhibits no distension and no mass. There is no tenderness. There is no rebound and no guarding.  Genitourinary:  Def gyn  Musculoskeletal:  Normal range of motion. She exhibits no edema and no tenderness.  Lymphadenopathy:    She has no cervical adenopathy.  Neurological: She is alert and oriented to person, place, and time. She has normal reflexes. No cranial nerve deficit. She exhibits normal muscle tone. Coordination normal.  Skin: Skin is warm and dry. No rash noted. No erythema. No pallor.  Left 5th toe nail thick discolored  Psychiatric: She has a normal mood and affect. Her behavior is normal. Judgment and thought content normal.      EKG NSCSPT WNL     Assessment & Plan:   1. CPE- Update screening labs/ History/ Immunizations/ Testing as needed. Advised healthy diet, QD exercise, increase H20 and continue RX/ Vitamins AD.  2. Fatigue vs sleep apnea- check labs, increase activity and H2O, get sleep study at [facility]  3. Nail fungus- Hygiene discussed  4. Tobacco Dep- advised cessation techniques and need for d/c to decrease Risk

## 2014-04-30 NOTE — Patient Instructions (Addendum)
Preventing Toenail Fungus from Recurring   Sanitize your shoes with Mycomist spray or a similar shoe sanitizer spray.  Follow the instructions on the bottle and dry them outside in the sun or with a hairdryer.  We also recommend repeating the sanitization once weekly in shoes you wear most often.   Throw away any shoes you have worn a significant amount without socks-fungus thrives in a warm moist environment and you want to avoid re-infection after your laser procedure   Bleach your socks with regular or color safe bleach   Change your socks regularly to keep your feet clean and dry (especially if you have sweaty feet)-if sweaty feet are a problem, let your doctor know-there is a great lotion that helps with this problem.   Clean your toenail clippers with alcohol before you use them if you do your own toenails and make sure to replace Emory boards and orange sticks regularly  If you get regular pedicures, bring your own instruments or go to a spa that sterilizes their instruments in an autoclave.We want weight loss that will last so you should lose 1-2 pounds a week.  THAT IS IT! Please pick THREE things a month to change. Once it is a habit check off the item. Then pick another three items off the list to become habits.  If you are already doing a habit on the list GREAT!  Cross that item off! o Don't drink your calories. Ie, alcohol, soda, fruit juice, and sweet tea.  o Drink more water. Drink a glass when you feel hungry or before each meal.  o Eat breakfast - Complex carb and protein (likeDannon light and fit yogurt, oatmeal, fruit, eggs, [place] bacon). o Measure your cereal.  Eat no more than one cup a day. (ie [place]) o Eat an apple a day. o Add a vegetable a day. o Try a new vegetable a month. o Use Pam! Stop using oil or butter to cook. o Don't finish your plate or use smaller plates. o Share your dessert. o Eat sugar free Jello for dessert or frozen grapes. o Don't eat 2-3  hours before bed. o Switch to whole wheat bread, pasta, and brown rice. o Make healthier choices when you eat out. No fries! o Pick baked chicken, NOT fried. o Don't forget to SLOW DOWN when you eat. It is not going anywhere.  o Take the stairs. o Park far away in the parking lot o [employer] (or weights) for 10 minutes while watching TV. o Walk at work for 10 minutes during break. o Walk outside 1 time a week with your friend, kids, dog, or significant other. o Start a walking group at [place] the mall as much as you can tolerate.  o Keep a food diary. o Weigh yourself daily. o Walk for 15 minutes 3 days per week. o Cook at home more often and eat out less.  If life happens and you go back to old habits, it is okay.  Just start over. You can do it!   If you experience chest pain, get short of breath, or tired during the exercise, please stop immediately and inform your doctor.    Bad carbs also include fruit juice, alcohol, and sweet tea. These are empty calories that do not signal to your brain that you are full.   Please remember the good carbs are still carbs which convert into sugar. So please measure them out no more than 1/2-1 cup of  rice, oatmeal, pasta, and beans.  Veggies are however free foods! Pile them on.   I like lean protein at every meal such as chicken, [place], pork chops, cottage cheese, etc. Just do not fry these meats and please center your meal around vegetable, the meats should be a side dish.   No all fruit is created equal. Please see the list below, the fruit at the bottom is higher in sugars than the fruit at the top

## 2014-05-01 LAB — URINALYSIS, ROUTINE W REFLEX MICROSCOPIC
BILIRUBIN URINE: NEGATIVE
GLUCOSE, UA: NEGATIVE mg/dL
Hgb urine dipstick: NEGATIVE
Ketones, ur: NEGATIVE mg/dL
Leukocytes, UA: NEGATIVE
Nitrite: NEGATIVE
Protein, ur: NEGATIVE mg/dL
SPECIFIC GRAVITY, URINE: 1.016 (ref 1.005–1.030)
UROBILINOGEN UA: 0.2 mg/dL (ref 0.0–1.0)
pH: 6.5 (ref 5.0–8.0)

## 2014-05-01 LAB — BASIC METABOLIC PANEL WITH GFR
BUN: 17 mg/dL (ref 6–23)
CHLORIDE: 104 meq/L (ref 96–112)
CO2: 28 meq/L (ref 19–32)
Calcium: 9.4 mg/dL (ref 8.4–10.5)
Creat: 0.8 mg/dL (ref 0.50–1.10)
GFR, EST NON AFRICAN AMERICAN: 89 mL/min
GFR, Est African American: >89 mL/min
Glucose, Bld: 96 mg/dL (ref 70–99)
Potassium: 4.8 mEq/L (ref 3.5–5.3)
Sodium: 139 mEq/L (ref 135–145)

## 2014-05-01 LAB — HEPATIC FUNCTION PANEL
ALK PHOS: 74 U/L (ref 39–117)
ALT: 21 U/L (ref 0–35)
AST: 16 U/L (ref 0–37)
Albumin: 4 g/dL (ref 3.5–5.2)
BILIRUBIN INDIRECT: 0.4 mg/dL (ref 0.2–1.2)
Bilirubin, Direct: 0.1 mg/dL (ref 0.0–0.3)
Total Bilirubin: 0.5 mg/dL (ref 0.2–1.2)
Total Protein: 6.6 g/dL (ref 6.0–8.3)

## 2014-05-01 LAB — LIPID PANEL
Cholesterol: 209 mg/dL — ABNORMAL HIGH (ref 0–200)
HDL: 55 mg/dL (ref >39)
LDL CALC: 143 mg/dL — AB (ref 0–99)
Total CHOL/HDL Ratio: 3.8 Ratio
Triglycerides: 56 mg/dL (ref <150)
VLDL: 11 mg/dL (ref 0–40)

## 2014-05-01 LAB — MICROALBUMIN / CREATININE URINE RATIO
Creatinine, Urine: 111.5 mg/dL
MICROALB UR: 0.5 mg/dL (ref 0.00–1.89)
MICROALB/CREAT RATIO: 4.5 mg/g (ref 0.0–30.0)

## 2014-05-01 LAB — IRON AND TIBC
%SAT: 17 % — ABNORMAL LOW (ref 20–55)
IRON: 51 ug/dL (ref 42–145)
TIBC: 299 ug/dL (ref 250–470)
UIBC: 248 ug/dL (ref 125–400)

## 2014-05-01 LAB — VITAMIN D 25 HYDROXY (VIT D DEFICIENCY, FRACTURES): Vit D, 25-Hydroxy: 34 ng/mL (ref 30–89)

## 2014-05-01 LAB — VITAMIN B12: Vitamin B-12: 631 pg/mL (ref 211–911)

## 2014-05-01 LAB — INSULIN, FASTING: Insulin fasting, serum: 18 u[IU]/mL (ref 3–28)

## 2014-05-01 LAB — TSH: TSH: 0.791 u[IU]/mL (ref 0.350–4.500)

## 2014-05-01 LAB — MAGNESIUM: Magnesium: 2.1 mg/dL (ref 1.5–2.5)

## 2014-05-13 ENCOUNTER — Telehealth: Payer: Self-pay | Admitting: *Deleted

## 2014-05-13 NOTE — Telephone Encounter (Signed)
PT took dose pk rash went away on her body but has returned she is trying to figure out what is causing the rash,  But is asking for a refill on the dose pk & see if it completely will go away

## 2014-05-15 NOTE — Telephone Encounter (Signed)
Talked to pt & was a miss understanding that pt actually saw the nurse at her work & was given a dose pk.  Pt is coming in on Monday

## 2014-05-15 NOTE — Telephone Encounter (Signed)
Per pt said she got dose pk at [facility]?

## 2014-05-18 ENCOUNTER — Ambulatory Visit: Payer: Self-pay | Admitting: Physician Assistant

## 2014-05-25 ENCOUNTER — Ambulatory Visit: Payer: 59 | Admitting: Family Medicine

## 2014-06-05 ENCOUNTER — Other Ambulatory Visit: Payer: Self-pay | Admitting: Emergency Medicine

## 2014-06-05 MED ORDER — DULOXETINE HCL 60 MG PO CPEP: 60.0000 mg | ORAL_CAPSULE | Freq: Every day | ORAL | Status: DC

## 2014-06-09 ENCOUNTER — Other Ambulatory Visit: Payer: Self-pay

## 2014-06-09 DIAGNOSIS — Z1231 Encounter for screening mammogram for malignant neoplasm of breast: Secondary | ICD-10-CM

## 2014-06-17 ENCOUNTER — Ambulatory Visit (HOSPITAL_BASED_OUTPATIENT_CLINIC_OR_DEPARTMENT_OTHER): Payer: 59 | Attending: Internal Medicine

## 2014-06-17 VITALS — Ht 67.0 in | Wt 200.0 lb

## 2014-06-17 DIAGNOSIS — G4730 Sleep apnea, unspecified: Principal | ICD-10-CM

## 2014-06-17 DIAGNOSIS — G4733 Obstructive sleep apnea (adult) (pediatric): Secondary | ICD-10-CM

## 2014-06-17 DIAGNOSIS — R0989 Other specified symptoms and signs involving the circulatory and respiratory systems: Secondary | ICD-10-CM | POA: Insufficient documentation

## 2014-06-17 DIAGNOSIS — R0609 Other forms of dyspnea: Secondary | ICD-10-CM | POA: Insufficient documentation

## 2014-06-17 DIAGNOSIS — G4710 Hypersomnia, unspecified: Secondary | ICD-10-CM | POA: Insufficient documentation

## 2014-06-18 ENCOUNTER — Ambulatory Visit
Admission: RE | Admit: 2014-06-18 | Discharge: 2014-06-18 | Disposition: A | Discharge status: Home / Self Care | Payer: 59 | Source: Ambulatory Visit

## 2014-06-18 DIAGNOSIS — Z1231 Encounter for screening mammogram for malignant neoplasm of breast: Secondary | ICD-10-CM

## 2014-06-20 DIAGNOSIS — G4733 Obstructive sleep apnea (adult) (pediatric): Secondary | ICD-10-CM

## 2014-06-20 NOTE — Sleep Study (Signed)
   NAME: Rebecca [name] DATE OF BIRTH:  [birth year] MEDICAL RECORD NUMBER [id number]  LOCATION: [facility]  PHYSICIAN: Brigitta Pricer D  DATE OF STUDY: 06/17/2014  SLEEP STUDY TYPE: Nocturnal Polysomnogram               REFERRING PHYSICIAN: Unk Pinto, MD  INDICATION FOR STUDY: Hypersomnia with sleep apnea  EPWORTH SLEEPINESS SCORE:   10/24 HEIGHT: 5\' 7"  (170.2 cm)  WEIGHT: 90.719 kg (200 lb)    Body mass index is 31.32 kg/(m^2).  NECK SIZE: 15.5 in.  MEDICATIONS: Charted for review  SLEEP ARCHITECTURE: Total sleep time 291.5 minutes with sleep efficiency 76.3%. Stage I was 25%, stage II 57.5%, stage III absent, REM 17.5% of total sleep time. Sleep latency 57.5 minutes, REM latency 121.5 minutes, awake after sleep onset 32.5 minutes, arousal index 5.4, bedtime medication: None  RESPIRATORY DATA: Apnea hypopneas index (AHI) 4.9 per hour. 24 total events scored including 6 obstructive apneas and 18 hypopneas. Events were not positional. REM AHI 3.5 per hour. There were not enough events to permit split protocol CPAP titration.  OXYGEN DATA: Mild to occasionally loud snoring with oxygen desaturation to a nadir of 87% and a mean saturation of 95.3% on room air.  CARDIAC DATA: Normal sinus rhythm  MOVEMENT/PARASOMNIA: No significant movement disturbance, no bathroom trips,  IMPRESSION/ RECOMMENDATION:   1) Occasional respiratory event with sleep disturbance, within normal limits. AHI of 4.9 per hour (the normal range for adults is an AHI from 0-5 events per hour). Mild to occasionally loud snoring with oxygen desaturation to a nadir of 87% and a mean saturation of 95.3% on room air.   Signed Baird Lyons M.D. Deneise Lever Diplomate, American Board of Sleep Medicine  ELECTRONICALLY SIGNED ON:  06/20/2014, 4:05 PM [place] PH: [phone]   FX: [phone] [place]

## 2014-06-22 ENCOUNTER — Other Ambulatory Visit: Payer: Self-pay | Admitting: Emergency Medicine

## 2014-06-22 ENCOUNTER — Telehealth: Payer: Self-pay

## 2014-06-22 DIAGNOSIS — R928 Other abnormal and inconclusive findings on diagnostic imaging of breast: Secondary | ICD-10-CM

## 2014-06-22 NOTE — Telephone Encounter (Signed)
Spoke with patient and she is aware of sleep study results

## 2014-06-25 ENCOUNTER — Ambulatory Visit
Admission: RE | Admit: 2014-06-25 | Discharge: 2014-06-25 | Disposition: A | Discharge status: Home / Self Care | Payer: 59 | Source: Ambulatory Visit | Attending: Emergency Medicine | Admitting: Emergency Medicine

## 2014-06-25 DIAGNOSIS — R928 Other abnormal and inconclusive findings on diagnostic imaging of breast: Secondary | ICD-10-CM

## 2014-06-29 ENCOUNTER — Ambulatory Visit (INDEPENDENT_AMBULATORY_CARE_PROVIDER_SITE_OTHER): Payer: 59 | Admitting: Physician Assistant

## 2014-06-29 VITALS — BP 120/78 | HR 100 | Temp 97.9°F | Resp 16 | Wt 207.0 lb

## 2014-06-29 DIAGNOSIS — J0100 Acute maxillary sinusitis, unspecified: Secondary | ICD-10-CM

## 2014-06-29 MED ORDER — PREDNISONE 20 MG PO TABS: ORAL_TABLET | ORAL | Status: DC

## 2014-06-29 MED ORDER — PROMETHAZINE-CODEINE 6.25-10 MG/5ML PO SYRP: 5.0000 mL | ORAL_SOLUTION | Freq: Four times a day (QID) | ORAL | Status: DC | PRN

## 2014-06-29 MED ORDER — AZITHROMYCIN 250 MG PO TABS: ORAL_TABLET | ORAL | Status: DC

## 2014-06-29 NOTE — Progress Notes (Signed)
   Subjective:    Patient ID: Rebecca [name], female    DOB: Dec 26, 1970, 43 y.o.   MRN: [id number]  Sinusitis This is a new problem. Episode onset: 1.5 weeks. The problem has been gradually worsening since onset. There has been no fever. Associated symptoms include congestion, coughing, shortness of breath and sinus pressure. Pertinent negatives include no chills, diaphoresis, ear pain, headaches, hoarse voice, neck pain, sneezing, sore throat or swollen glands. Treatments tried: mucinex, sudafed, cough OTC.    Review of Systems  Constitutional: Negative for chills and diaphoresis.  HENT: Positive for congestion, postnasal drip and sinus pressure. Negative for ear pain, hoarse voice, sneezing and sore throat.   Respiratory: Positive for cough and shortness of breath. Negative for chest tightness and wheezing.   Cardiovascular: Negative.   Gastrointestinal: Negative.   Genitourinary: Negative.   Musculoskeletal: Negative for neck pain.  Neurological: Negative for headaches.       Objective:   Physical Exam  Constitutional: She appears well-developed and well-nourished.  HENT:  Head: Normocephalic and atraumatic.  Right Ear: External ear normal.  Nose: Right sinus exhibits maxillary sinus tenderness. Right sinus exhibits no frontal sinus tenderness. Left sinus exhibits maxillary sinus tenderness. Left sinus exhibits no frontal sinus tenderness.  Eyes: Conjunctivae and EOM are normal.  Neck: Normal range of motion. Neck supple.  Cardiovascular: Normal rate, regular rhythm, normal heart sounds and intact distal pulses.   Pulmonary/Chest: Effort normal. No respiratory distress. She has wheezes (right lower lobe). She has no rales. She exhibits no tenderness.  Abdominal: Soft. Bowel sounds are normal.  Lymphadenopathy:    She has no cervical adenopathy.  Skin: Skin is warm and dry.       Assessment & Plan:  1. Acute maxillary sinusitis, recurrence not specified [461.0] If not  better call the office, if getting worsening SOB, CP go to the ER -Rest, fluids, allergy pill OTC - azithromycin (ZITHROMAX) 250 MG tablet; 2 tablets by mouth today then one tablet daily for 4 days.  Dispense: 6 tablet; Refill: 1 - predniSONE (DELTASONE) 20 MG tablet; 2 tablets daily for 3 days, 1 tablet daily for 4 days.  Dispense: 10 tablet; Refill: 0 - promethazine-codeine (PHENERGAN WITH CODEINE) 6.25-10 MG/5ML syrup; Take 5 mLs by mouth every 6 (six) hours as needed for cough.  Dispense: 240 mL; Refill: 0

## 2014-06-29 NOTE — Patient Instructions (Signed)
Upper Respiratory Infection, Adult An upper respiratory infection (URI) is also known as the common cold. It is often caused by a type of germ (virus). Colds are easily spread (contagious). You can pass it to others by kissing, coughing, sneezing, or drinking out of the same glass. Usually, you get better in 1 or 2 weeks.  HOME CARE   Only take medicine as told by your doctor.  Use a warm mist humidifier or breathe in steam from a hot shower.  Drink enough water and fluids to keep your pee (urine) clear or pale yellow.  Get plenty of rest.  Return to work when your temperature is back to normal or as told by your doctor. You may use a face mask and wash your hands to stop your cold from spreading. GET HELP RIGHT AWAY IF:   After the first few days, you feel you are getting worse.  You have questions about your medicine.  You have chills, shortness of breath, or brown or red spit (mucus).  You have yellow or brown snot (nasal discharge) or pain in the face, especially when you bend forward.  You have a fever, puffy (swollen) neck, pain when you swallow, or white spots in the back of your throat.  You have a bad headache, ear pain, sinus pain, or chest pain.  You have a high-pitched whistling sound when you breathe in and out (wheezing).  You have a lasting cough or cough up blood.  You have sore muscles or a stiff neck. MAKE SURE YOU:   Understand these instructions.  Will watch your condition.  Will get help right away if you are not doing well or get worse. Document Released: 05/01/2008 Document Revised: 02/05/2012 Document Reviewed: 02/18/2014 ExitCare Patient Information 2015 [employer]. This information is not intended to replace advice given to you by your health care provider. Make sure you discuss any questions you have with your health care provider.  

## 2014-07-13 ENCOUNTER — Encounter: Payer: Self-pay | Admitting: Family Medicine

## 2014-07-13 ENCOUNTER — Ambulatory Visit (INDEPENDENT_AMBULATORY_CARE_PROVIDER_SITE_OTHER): Payer: 59 | Admitting: Family Medicine

## 2014-07-13 VITALS — Ht 66.75 in | Wt 205.2 lb

## 2014-07-13 DIAGNOSIS — E669 Obesity, unspecified: Secondary | ICD-10-CM

## 2014-07-13 DIAGNOSIS — E782 Mixed hyperlipidemia: Secondary | ICD-10-CM

## 2014-07-13 NOTE — Patient Instructions (Addendum)
-   WILLPOWER is a Research scientist (life sciences).   If you are using a lot of willpower to get through your day, there won't be much left for behavior change.    GOALS: 1. Eat at least 3 meals and 1-2 snacks per day.  Aim for no more than 5 hours between eating.  Eat breakfast within the first hour of getting up.    - Breakfast on non-work days might be toast with peanut butter OR [place] yogurt and fruit OR fruit with a couple of hard-boiled eggs OR cheese toast.  2. Obtain twice as many veg's as protein or carbohydrate foods for both lunch and dinner.  - Veg's at lunch might be left-overs form dinner; raw broccoli or tomatoes or cucumbers or celery; soup with added veg's'; salad. 3. Exercise at the gym for at least 45-60 min 2 X wk.    - 5 minute rule of exercise:  If you really don't feel like exercising one day, promise yourself you'll do at least 5 minutes.  At the end of 5 minutes, if you still feel that way, it's ok to quit.    - When at work or at home, try to move more frequently through the day.    Remember that the most valuable things you have had in your life have usually been what you have worked the hardest for.    - Benefits of meeting the above goals:  Weight loss, feeling motivated to do stuff I used to do, have more confidence, look good, have a sense of accomplishment, feel good about myself.   - Complete your goals sheet, and bring to follow-up.

## 2014-07-13 NOTE — Progress Notes (Signed)
Medical Nutrition Therapy:  Appt start time: 1100 end time:  1200.  Assessment:  Primary concerns today: Weight management.  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    Learning Readiness:   Ready; wants to learn to eat healthier to manage weight.   Barriers to learning/adherence to lifestyle change: Patient worries she is not motivated enough to make the needed changes (exercise and healthier eating).  She worries she doesn't have the willpower for adequate portion control, for example.  Upon leaving today, she did say, however, that she is excited to get started on her new goals.    Usual eating pattern includes 3 meals and 4 snacks per day on a work day; 2 meals and 2 snacks per day on a non-work day. Usual physical activity includes none currently; did go to [employer], but hasn't been for a while.  Frequent foods include eggs, chx, veg's, fruit drinks most days; Crystal Light daily, 5 c coffee w/ 5-6 tsp sugar and flavored creamer.  Avoided foods include avocado.    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    Progress Towards Goal(s):  In progress.   Nutritional Diagnosis:  NB-2.1 Physical inactivity As related to poor motivation.  As evidenced by no regular exercise currently.    Intervention:  Nutrition education.  Handouts given during visit include:  AVS  Goals sheet  Demonstrated degree of understanding via:  Teach Back   Monitoring/Evaluation:  Dietary  intake, exercise, and body weight in 6 week(s).

## 2014-07-23 ENCOUNTER — Emergency Department (HOSPITAL_COMMUNITY)
Admission: EM | Admit: 2014-07-23 | Discharge: 2014-07-23 | Disposition: A | Discharge status: Home / Self Care | Payer: 59 | Attending: Emergency Medicine | Admitting: Emergency Medicine

## 2014-07-23 ENCOUNTER — Encounter (HOSPITAL_COMMUNITY): Payer: Self-pay | Admitting: Emergency Medicine

## 2014-07-23 DIAGNOSIS — Z862 Personal history of diseases of the blood and blood-forming organs and certain disorders involving the immune mechanism: Secondary | ICD-10-CM | POA: Insufficient documentation

## 2014-07-23 DIAGNOSIS — Z9851 Tubal ligation status: Secondary | ICD-10-CM | POA: Diagnosis not present

## 2014-07-23 DIAGNOSIS — E669 Obesity, unspecified: Secondary | ICD-10-CM | POA: Diagnosis not present

## 2014-07-23 DIAGNOSIS — F329 Major depressive disorder, single episode, unspecified: Secondary | ICD-10-CM | POA: Insufficient documentation

## 2014-07-23 DIAGNOSIS — Z8739 Personal history of other diseases of the musculoskeletal system and connective tissue: Secondary | ICD-10-CM | POA: Insufficient documentation

## 2014-07-23 DIAGNOSIS — F17200 Nicotine dependence, unspecified, uncomplicated: Secondary | ICD-10-CM | POA: Insufficient documentation

## 2014-07-23 DIAGNOSIS — F3289 Other specified depressive episodes: Secondary | ICD-10-CM | POA: Insufficient documentation

## 2014-07-23 DIAGNOSIS — Z860100 Personal history of colon polyps, unspecified: Secondary | ICD-10-CM | POA: Insufficient documentation

## 2014-07-23 DIAGNOSIS — Z86718 Personal history of other venous thrombosis and embolism: Secondary | ICD-10-CM | POA: Diagnosis not present

## 2014-07-23 DIAGNOSIS — K279 Peptic ulcer, site unspecified, unspecified as acute or chronic, without hemorrhage or perforation: Secondary | ICD-10-CM | POA: Diagnosis not present

## 2014-07-23 DIAGNOSIS — M549 Dorsalgia, unspecified: Secondary | ICD-10-CM | POA: Insufficient documentation

## 2014-07-23 DIAGNOSIS — K219 Gastro-esophageal reflux disease without esophagitis: Secondary | ICD-10-CM | POA: Diagnosis not present

## 2014-07-23 DIAGNOSIS — R109 Unspecified abdominal pain: Secondary | ICD-10-CM | POA: Insufficient documentation

## 2014-07-23 DIAGNOSIS — Z3202 Encounter for pregnancy test, result negative: Secondary | ICD-10-CM | POA: Diagnosis not present

## 2014-07-23 DIAGNOSIS — Z9889 Other specified postprocedural states: Secondary | ICD-10-CM | POA: Insufficient documentation

## 2014-07-23 DIAGNOSIS — Z8639 Personal history of other endocrine, nutritional and metabolic disease: Secondary | ICD-10-CM | POA: Diagnosis not present

## 2014-07-23 DIAGNOSIS — Z87442 Personal history of urinary calculi: Secondary | ICD-10-CM | POA: Diagnosis not present

## 2014-07-23 LAB — COMPREHENSIVE METABOLIC PANEL
ALBUMIN: 3.9 g/dL (ref 3.5–5.2)
ALT: 16 U/L (ref 0–35)
AST: 17 U/L (ref 0–37)
Alkaline Phosphatase: 79 U/L (ref 39–117)
Anion gap: 12 (ref 5–15)
BUN: 11 mg/dL (ref 6–23)
CALCIUM: 9.4 mg/dL (ref 8.4–10.5)
CO2: 27 mEq/L (ref 19–32)
Chloride: 105 mEq/L (ref 96–112)
Creatinine, Ser: 0.77 mg/dL (ref 0.50–1.10)
GFR calc non Af Amer: >90 mL/min (ref >90)
GLUCOSE: 88 mg/dL (ref 70–99)
Potassium: 4.1 mEq/L (ref 3.7–5.3)
SODIUM: 144 meq/L (ref 137–147)
Total Bilirubin: 0.5 mg/dL (ref 0.3–1.2)
Total Protein: 7.2 g/dL (ref 6.0–8.3)

## 2014-07-23 LAB — URINALYSIS, ROUTINE W REFLEX MICROSCOPIC
BILIRUBIN URINE: NEGATIVE
Glucose, UA: NEGATIVE mg/dL
HGB URINE DIPSTICK: NEGATIVE
Ketones, ur: NEGATIVE mg/dL
Leukocytes, UA: NEGATIVE
Nitrite: NEGATIVE
PROTEIN: NEGATIVE mg/dL
Specific Gravity, Urine: 1.005 (ref 1.005–1.030)
UROBILINOGEN UA: 0.2 mg/dL (ref 0.0–1.0)
pH: 6.5 (ref 5.0–8.0)

## 2014-07-23 LAB — LIPASE, BLOOD: Lipase: 45 U/L (ref 11–59)

## 2014-07-23 LAB — CBC WITH DIFFERENTIAL/PLATELET
BASOS ABS: 0 10*3/uL (ref 0.0–0.1)
BASOS PCT: 0 % (ref 0–1)
EOS ABS: 0.1 10*3/uL (ref 0.0–0.7)
EOS PCT: 2 % (ref 0–5)
HCT: 38.6 % (ref 36.0–46.0)
Hemoglobin: 12.9 g/dL (ref 12.0–15.0)
LYMPHS ABS: 2.6 10*3/uL (ref 0.7–4.0)
Lymphocytes Relative: 56 % — ABNORMAL HIGH (ref 12–46)
MCH: 30.1 pg (ref 26.0–34.0)
MCHC: 33.4 g/dL (ref 30.0–36.0)
MCV: 90 fL (ref 78.0–100.0)
Monocytes Absolute: 0.5 10*3/uL (ref 0.1–1.0)
Monocytes Relative: 12 % (ref 3–12)
Neutro Abs: 1.4 10*3/uL — ABNORMAL LOW (ref 1.7–7.7)
Neutrophils Relative %: 30 % — ABNORMAL LOW (ref 43–77)
PLATELETS: 243 10*3/uL (ref 150–400)
RBC: 4.29 MIL/uL (ref 3.87–5.11)
RDW: 12.8 % (ref 11.5–15.5)
WBC: 4.6 10*3/uL (ref 4.0–10.5)

## 2014-07-23 LAB — I-STAT TROPONIN, ED: TROPONIN I, POC: 0.03 ng/mL (ref 0.00–0.08)

## 2014-07-23 LAB — POC URINE PREG, ED: PREG TEST UR: NEGATIVE

## 2014-07-23 MED ORDER — HYDROCODONE-ACETAMINOPHEN 5-325 MG PO TABS
1.0000 | ORAL_TABLET | Freq: Once | ORAL | Status: AC
  Administered 2014-07-23: 1 via ORAL
  Filled 2014-07-23: qty 1

## 2014-07-23 MED ORDER — HYDROCODONE-ACETAMINOPHEN 5-325 MG PO TABS: 1.0000 | ORAL_TABLET | ORAL | Status: DC | PRN

## 2014-07-23 MED ORDER — CYCLOBENZAPRINE HCL 10 MG PO TABS: 10.0000 mg | ORAL_TABLET | Freq: Three times a day (TID) | ORAL | Status: DC | PRN

## 2014-07-23 MED ORDER — OXYCODONE-ACETAMINOPHEN 5-325 MG PO TABS
1.0000 | ORAL_TABLET | Freq: Once | ORAL | Status: AC
  Administered 2014-07-23: 1 via ORAL
  Filled 2014-07-23: qty 1

## 2014-07-23 MED ORDER — ONDANSETRON 4 MG PO TBDP
8.0000 mg | ORAL_TABLET | Freq: Once | ORAL | Status: AC
  Administered 2014-07-23: 8 mg via ORAL
  Filled 2014-07-23: qty 2

## 2014-07-23 NOTE — ED Notes (Signed)
The patient is having left sided pain and it radiates to her shoulder blade.  She also says she is nauseated.  She has taken ibuprofen and it is not working.  The patient has been told she has kidney stones and that they are going to pass but she was not aware which side the stones are on. She denies any urinary symptoms.  She also thought it was gas but she has passed the gas and the pain is still there.

## 2014-07-23 NOTE — ED Provider Notes (Signed)
CSN: [id number]     Arrival date & time 07/23/14  1718 History   First MD Initiated Contact with Patient 07/23/14 2214     Chief Complaint  Patient presents with  . Flank Pain    The patient is having left sided pain and it radiates to her shoulder blade.  She also says she is nauseated.  She has taken ibuprofen and it is not working.       (Consider location/radiation/quality/duration/timing/severity/associated sxs/prior Treatment) HPI Patient with a reported history of kidney stones, PUD and gallstones presents for back pain. Pain is localized to the upper aspect of the scapula, the mid thoracic back, the lower left back. It is sharp in character and is non radiating. No known exacerbating or relieving factors. Never had pain like this in the past. No chest pain or shortness of breath. No N/V/D. No dysuria. NO vag dc or vag bleeding. + history of DVTs peripartum. No recent car rides, no long plane rides. No currently on estrogen. No exertional symptoms.   Past Medical History  Diagnosis Date  . 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 PUD (peptic ulcer disease)   . Hiatal hernia   . Duodenitis   . Esophagitis   . Vitamin D deficiency   . Fatigue   . Sacroiliac pain   . Depression   . Anemia   . GERD (gastroesophageal reflux disease)   . Obesity   . Gallstones   . Colon polyp 12/25/2011    Tubular adenomatous   Past Surgical History  Procedure Laterality Date  . Tubal ligation    . Breast lumpectomy Left     benign  . Colposcopy  2012    NEG   Family History  Problem Relation Age of Onset  . Aneurysm Mother   . Heart disease Mother   . Hypertension Mother   . Stroke Mother   . Lupus Mother   . Diabetes Maternal Aunt   . Cancer Maternal Aunt   . Colon cancer Neg Hx    History  Substance Use Topics  . Smoking status: Current Some Day Smoker -- 0.10 packs/day    Types: Cigarettes  . Smokeless tobacco: Never Used     Comment: Pt D/C smoking using a nicotine  patch  . Alcohol Use: Yes     Comment: occasional glass of wine   OB History   Grav Para Term Preterm Abortions TAB SAB Ect Mult Living   3 2 2  1  1   2      Review of Systems  All other systems reviewed and are negative.     Allergies  Review of patient's allergies indicates no known allergies.  Home Medications   Prior to Admission medications   Medication Sig Start Date End Date Taking? Authorizing Provider  DULoxetine (CYMBALTA) 60 MG capsule Take 60 mg by mouth daily. 06/05/14  Yes Melissa R Smith, PA-C  pantoprazole (PROTONIX) 40 MG tablet Take 40 mg by mouth daily. 04/30/14  Yes Melissa R Smith, PA-C   BP 136/84  Pulse 83  Temp(Src) 97.7 F (36.5 C) (Oral)  Resp 18  Ht 5\' 6"  (1.676 m)  Wt 206 lb (93.441 kg)  BMI 33.27 kg/m2  SpO2 100%  LMP 08/13/2013 Physical Exam  Constitutional: She is oriented to person, place, and time. She appears well-developed and well-nourished. No distress.  HENT:  Head: Normocephalic and atraumatic.  Right Ear: External ear normal.  Left Ear: External ear normal.  Eyes:  Conjunctivae and EOM are normal. Right eye exhibits no discharge. Left eye exhibits no discharge.  Neck: Normal range of motion. Neck supple. No JVD present.  Cardiovascular: Normal rate, regular rhythm and normal heart sounds.  Exam reveals no gallop and no friction rub.   No murmur heard. Pulmonary/Chest: Effort normal and breath sounds normal. No stridor. No respiratory distress. She has no wheezes. She has no rales. She exhibits no tenderness.  Abdominal: Soft. Bowel sounds are normal. She exhibits no distension and no mass. There is no hepatosplenomegaly. There is no tenderness. There is no rigidity, no rebound, no guarding, no CVA tenderness, no tenderness at McBurney's point and negative Murphy's sign.  Musculoskeletal: Normal range of motion. She exhibits no edema.       Arms: Neurological: She is alert and oriented to person, place, and time.  Skin: Skin is  warm. No rash noted. She is not diaphoretic.  Psychiatric: She has a normal mood and affect. Her behavior is normal.    ED Course  Procedures (including critical care time) Labs Review Labs Reviewed  CBC WITH DIFFERENTIAL - Abnormal; Notable for the following:    Neutrophils Relative % 30 (*)    Neutro Abs 1.4 (*)    Lymphocytes Relative 56 (*)    All other components within normal limits  COMPREHENSIVE METABOLIC PANEL  LIPASE, BLOOD  URINALYSIS, ROUTINE W REFLEX MICROSCOPIC  I-STAT TROPOININ, ED  POC URINE PREG, ED    Imaging Review No results found.   EKG Interpretation None      MDM   Final diagnoses:  None    AFVSS. NAD. Resting comfortable. No evidence of peritonitis on exam. Trigger point ttp as noted in the depiction. UA not suggestive of UTI. No blood in the urine as well. Neg preg. Reflex trop obtained and negative, but I do not believe this is related to ACS as she has no typical symptoms.. Given her exam, nml VS - doubt PE. Likely msk pain. Doubt perforated PUD, pancreatitis, acute cholecystitis. Urine preg negative. Symptoms improved with PO narcotics here. Likely MSK pain. Gave rx for Norco and flexeril for symptoms. Gave strong return precautions including worsening symptoms or any other alarming or concerning symptoms or issues. FU with PCP in a week to consider physical therapy if pain is ongoing.  Labs reviewed. Care discussed with my attending, Dr. Jeneen Rinks.     Kelby Aline, MD 07/23/14 [phone]

## 2014-07-23 NOTE — Discharge Instructions (Signed)

## 2014-07-23 NOTE — ED Notes (Signed)
Pt. Reports left sided flank pain with radiation to left shoulder. States pain is intermittent but has constant "total body left side pain". Pt. Reports nausea and bloating but no vomiting. Last BM yesterday. Denies urinary symptoms.

## 2014-07-30 NOTE — ED Provider Notes (Signed)
I saw and evaluated the patient, reviewed the resident's note and I agree with the findings and plan.   EKG Interpretation None      Seen examined. Reports flank pain. Negative evaluation. Benign exam. Tenderness on exam suggests musculoskeletal cause. I agree with Dr. Toma Aran evaluation and plan for disposition  Tanna Furry, MD 07/30/14 2129

## 2014-08-11 ENCOUNTER — Ambulatory Visit: Payer: Self-pay | Admitting: Emergency Medicine

## 2014-08-25 ENCOUNTER — Ambulatory Visit: Payer: 59 | Admitting: Family Medicine

## 2014-08-26 ENCOUNTER — Ambulatory Visit: Payer: Self-pay | Admitting: Physician Assistant

## 2014-09-28 ENCOUNTER — Encounter (HOSPITAL_COMMUNITY): Payer: Self-pay | Admitting: Emergency Medicine

## 2014-10-08 ENCOUNTER — Ambulatory Visit: Payer: Self-pay | Admitting: Physician Assistant

## 2014-11-16 ENCOUNTER — Ambulatory Visit (INDEPENDENT_AMBULATORY_CARE_PROVIDER_SITE_OTHER): Payer: 59 | Admitting: Physician Assistant

## 2014-11-16 ENCOUNTER — Encounter: Payer: Self-pay | Admitting: Physician Assistant

## 2014-11-16 VITALS — BP 132/88 | HR 84 | Temp 97.7°F | Resp 16 | Ht 67.25 in | Wt 212.0 lb

## 2014-11-16 DIAGNOSIS — E559 Vitamin D deficiency, unspecified: Secondary | ICD-10-CM

## 2014-11-16 DIAGNOSIS — R7303 Prediabetes: Secondary | ICD-10-CM

## 2014-11-16 DIAGNOSIS — R7309 Other abnormal glucose: Secondary | ICD-10-CM

## 2014-11-16 DIAGNOSIS — E669 Obesity, unspecified: Secondary | ICD-10-CM

## 2014-11-16 DIAGNOSIS — I10 Essential (primary) hypertension: Secondary | ICD-10-CM | POA: Insufficient documentation

## 2014-11-16 DIAGNOSIS — Z79899 Other long term (current) drug therapy: Secondary | ICD-10-CM

## 2014-11-16 DIAGNOSIS — E782 Mixed hyperlipidemia: Secondary | ICD-10-CM

## 2014-11-16 LAB — CBC WITH DIFFERENTIAL/PLATELET
BASOS ABS: 0 10*3/uL (ref 0.0–0.1)
BASOS PCT: 0 % (ref 0–1)
EOS ABS: 0.1 10*3/uL (ref 0.0–0.7)
EOS PCT: 2 % (ref 0–5)
HCT: 38.8 % (ref 36.0–46.0)
Hemoglobin: 13 g/dL (ref 12.0–15.0)
Lymphocytes Relative: 48 % — ABNORMAL HIGH (ref 12–46)
Lymphs Abs: 2 10*3/uL (ref 0.7–4.0)
MCH: 29.3 pg (ref 26.0–34.0)
MCHC: 33.5 g/dL (ref 30.0–36.0)
MCV: 87.6 fL (ref 78.0–100.0)
MONO ABS: 0.5 10*3/uL (ref 0.1–1.0)
MPV: 9.1 fL — ABNORMAL LOW (ref 9.4–12.4)
Monocytes Relative: 12 % (ref 3–12)
Neutro Abs: 1.6 10*3/uL — ABNORMAL LOW (ref 1.7–7.7)
Neutrophils Relative %: 38 % — ABNORMAL LOW (ref 43–77)
PLATELETS: 285 10*3/uL (ref 150–400)
RBC: 4.43 MIL/uL (ref 3.87–5.11)
RDW: 13.6 % (ref 11.5–15.5)
WBC: 4.2 10*3/uL (ref 4.0–10.5)

## 2014-11-16 MED ORDER — GABAPENTIN 300 MG PO CAPS: ORAL_CAPSULE | ORAL | Status: DC

## 2014-11-16 NOTE — Progress Notes (Signed)
Assessment and Plan:  Hypertension: Continue medication, monitor blood pressure at home. Continue DASH diet.  Reminder to go to the ER if any CP, SOB, nausea, dizziness, severe HA, changes vision/speech, left arm numbness and tingling, and jaw pain. Cholesterol: Continue diet and exercise. Check cholesterol.  Pre-diabetes-Continue diet and exercise. Check A1C Vitamin D Def- check level and continue medications.  Obesity with co morbidities- long discussion about weight loss, diet, and exercise Insomnia- good sleep hygiene discussed, increase day time activity, try melatonin or benadryl - will send in gabapentin 300mg  to take QHS for possible RLS/lower back pain.   Continue diet and meds as discussed. Further disposition pending results of labs.  HPI 43 y.o. female  presents for 3 month follow up with hypertension, hyperlipidemia, prediabetes and vitamin D. Her blood pressure has been controlled at home, today their BP is BP: 132/88 mmHg She does not workout. She denies chest pain, shortness of breath, dizziness.  She is not on cholesterol medication and denies myalgias. Her cholesterol is at goal. The cholesterol last visit was:   Lab Results  Component Value Date   CHOL 209* 04/30/2014   HDL 55 04/30/2014   LDLCALC 143* 04/30/2014   TRIG 56 04/30/2014   CHOLHDL 3.8 04/30/2014   She has been working on diet and exercise for prediabetes, and denies paresthesia of the feet, polydipsia and polyuria. Last A1C in the office was:  Lab Results  Component Value Date   HGBA1C 6.0* 04/30/2014   Patient is on Vitamin D supplement.   Lab Results  Component Value Date   VD25OH 34 04/30/2014     BMI is Body mass index is 32.96 kg/(m^2)., she is working on diet and exercise, she saw a Nutritionalist in august but has not gone back. .She had a normal sleep study in July 2015 but she continues to have frequent awakenings and trouble with sleeping, feels tired in the AM. .  Wt Readings from Last 3  Encounters:  11/16/14 212 lb (96.163 kg)  07/23/14 206 lb (93.441 kg)  07/13/14 205 lb 3.2 oz (93.078 kg)   She was taking the flexeril and norco for her back pain but has not taken in a long time..She states her left leg will ache occ and she does have cramps in her legs and restless legs at night.   Current Medications:  Current Outpatient Prescriptions on File Prior to Visit  Medication Sig Dispense Refill  . cyclobenzaprine (FLEXERIL) 10 MG tablet Take 1 tablet (10 mg total) by mouth 3 (three) times daily as needed for muscle spasms. 20 tablet 0  . DULoxetine (CYMBALTA) 60 MG capsule Take 60 mg by mouth daily.    Marland Kitchen HYDROcodone-acetaminophen (NORCO/VICODIN) 5-325 MG per tablet Take 1 tablet by mouth every 4 (four) hours as needed for moderate pain or severe pain. 15 tablet 0  . pantoprazole (PROTONIX) 40 MG tablet Take 40 mg by mouth daily.     No current facility-administered medications on file prior to visit.   Medical History:  Past Medical History  Diagnosis Date  . 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 PUD (peptic ulcer disease)   . Hiatal hernia   . Duodenitis   . Esophagitis   . Vitamin D deficiency   . Fatigue   . Sacroiliac pain   . Depression   . Anemia   . GERD (gastroesophageal reflux disease)   . Obesity   . Gallstones   . Colon polyp 12/25/2011  Tubular adenomatous   Allergies: No Known Allergies   Review of Systems:  Review of Systems  Constitutional: Positive for malaise/fatigue. Negative for fever, chills, weight loss and diaphoresis.  HENT: Negative.   Eyes: Negative.   Respiratory: Negative.   Cardiovascular: Negative.   Gastrointestinal: Negative.   Genitourinary: Negative.   Musculoskeletal: Positive for myalgias and back pain. Negative for joint pain, falls and neck pain.  Skin: Negative.   Neurological: Negative.  Negative for weakness.  Endo/Heme/Allergies: Negative.   Psychiatric/Behavioral: Negative for depression, suicidal  ideas, hallucinations, memory loss and substance abuse. The patient has insomnia. The patient is not nervous/anxious.      Family history- Review and unchanged Social history- Review and unchanged Physical Exam: BP 132/88 mmHg  Pulse 84  Temp(Src) 97.7 F (36.5 C)  Resp 16  Ht 5' 7.25" (1.708 m)  Wt 212 lb (96.163 kg)  BMI 32.96 kg/m2  LMP 08/13/2013 Wt Readings from Last 3 Encounters:  11/16/14 212 lb (96.163 kg)  07/23/14 206 lb (93.441 kg)  07/13/14 205 lb 3.2 oz (93.078 kg)   General Appearance: Well nourished, in no apparent distress. Eyes: PERRLA, EOMs, conjunctiva no swelling or erythema Sinuses: No Frontal/maxillary tenderness ENT/Mouth: Ext aud canals clear, TMs without erythema, bulging. No erythema, swelling, or exudate on post pharynx.  Tonsils not swollen or erythematous. Hearing normal.  Neck: Supple, thyroid normal.  Respiratory: Respiratory effort normal, BS equal bilaterally without rales, rhonchi, wheezing or stridor.  Cardio: RRR with no MRGs. Brisk peripheral pulses without edema.  Abdomen: Soft, + BS, obese  Non tender, no guarding, rebound, hernias, masses. Lymphatics: Non tender without lymphadenopathy.  Musculoskeletal: Full ROM, 5/5 strength, normal gait.  Skin: Warm, dry without rashes, lesions, ecchymosis.  Neuro: Cranial nerves intact. Normal muscle tone, no cerebellar symptoms. Sensation intact.  Psych: Awake and oriented X 3, normal affect, Insight and Judgment appropriate.    Vicie Mutters, PA-C 10:02 AM [facility]

## 2014-11-16 NOTE — Patient Instructions (Signed)
We want weight loss that will last so you should lose 1-2 pounds a week.  THAT IS IT! Please pick THREE things a month to change. Once it is a habit check off the item. Then pick another three items off the list to become habits.  If you are already doing a habit on the list GREAT!  Cross that item off! o Don't drink your calories. Ie, alcohol, soda, fruit juice, and sweet tea.  o Drink more water. Drink a glass when you feel hungry or before each meal.  o Eat breakfast - Complex carb and protein (likeDannon light and fit yogurt, oatmeal, fruit, eggs, [place] bacon). o Measure your cereal.  Eat no more than one cup a day. (ie [place]) o Eat an apple a day. o Add a vegetable a day. o Try a new vegetable a month. o Use Pam! Stop using oil or butter to cook. o Don't finish your plate or use smaller plates. o Share your dessert. o Eat sugar free Jello for dessert or frozen grapes. o Don't eat 2-3 hours before bed. o Switch to whole wheat bread, pasta, and brown rice. o Make healthier choices when you eat out. No fries! o Pick baked chicken, NOT fried. o Don't forget to SLOW DOWN when you eat. It is not going anywhere.  o Take the stairs. o Park far away in the parking lot o [employer] (or weights) for 10 minutes while watching TV. o Walk at work for 10 minutes during break. o Walk outside 1 time a week with your friend, kids, dog, or significant other. o Start a walking group at [place] the mall as much as you can tolerate.  o Keep a food diary. o Weigh yourself daily. o Walk for 15 minutes 3 days per week. o Cook at home more often and eat out less.  If life happens and you go back to old habits, it is okay.  Just start over. You can do it!   If you experience chest pain, get short of breath, or tired during the exercise, please stop immediately and inform your doctor.   Can try melatonin 5mg -15 mg at night for sleep, can also do benadryl 25-50mg  at night for sleep.  If this  does not help we can try prescription medication.  Also here is some information about good sleep hygiene.   Insomnia Insomnia is frequent trouble falling and/or staying asleep. Insomnia can be a long term problem or a short term problem. Both are common. Insomnia can be a short term problem when the wakefulness is related to a certain stress or worry. Long term insomnia is often related to ongoing stress during waking hours and/or poor sleeping habits. Overtime, sleep deprivation itself can make the problem worse. Every little thing feels more severe because you are overtired and your ability to cope is decreased. CAUSES   Stress, anxiety, and depression.  Poor sleeping habits.  Distractions such as TV in the bedroom.  Naps close to bedtime.  Engaging in emotionally charged conversations before bed.  Technical reading before sleep.  Alcohol and other sedatives. They may make the problem worse. They can hurt normal sleep patterns and normal dream activity.  Stimulants such as caffeine for several hours prior to bedtime.  Pain syndromes and shortness of breath can cause insomnia.  Exercise late at night.  Changing time zones may cause sleeping problems (jet lag). It is sometimes helpful to have someone observe your sleeping patterns. They  should look for periods of not breathing during the night (sleep apnea). They should also look to see how long those periods last. If you live alone or observers are uncertain, you can also be observed at a sleep clinic where your sleep patterns will be professionally monitored. Sleep apnea requires a checkup and treatment. Give your caregivers your medical history. Give your caregivers observations your family has made about your sleep.  SYMPTOMS   Not feeling rested in the morning.  Anxiety and restlessness at bedtime.  Difficulty falling and staying asleep. TREATMENT   Your caregiver may prescribe treatment for an underlying medical  disorders. Your caregiver can give advice or help if you are using alcohol or other drugs for self-medication. Treatment of underlying problems will usually eliminate insomnia problems.  Medications can be prescribed for short time use. They are generally not recommended for lengthy use.  Over-the-counter sleep medicines are not recommended for lengthy use. They can be habit forming.  You can promote easier sleeping by making lifestyle changes such as:  Using relaxation techniques that help with breathing and reduce muscle tension.  Exercising earlier in the day.  Changing your diet and the time of your last meal. No night time snacks.  Establish a regular time to go to bed.  Counseling can help with stressful problems and worry.  Soothing music and white noise may be helpful if there are background noises you cannot remove.  Stop tedious detailed work at least one hour before bedtime. HOME CARE INSTRUCTIONS   Keep a diary. Inform your caregiver about your progress. This includes any medication side effects. See your caregiver regularly. Take note of:  Times when you are asleep.  Times when you are awake during the night.  The quality of your sleep.  How you feel the next day. This information will help your caregiver care for you.  Get out of bed if you are still awake after 15 minutes. Read or do some quiet activity. Keep the lights down. Wait until you feel sleepy and go back to bed.  Keep regular sleeping and waking hours. Avoid naps.  Exercise regularly.  Avoid distractions at bedtime. Distractions include watching television or engaging in any intense or detailed activity like attempting to balance the household checkbook.  Develop a bedtime ritual. Keep a familiar routine of bathing, brushing your teeth, climbing into bed at the same time each night, listening to soothing music. Routines increase the success of falling to sleep faster.  Use relaxation techniques.  This can be using breathing and muscle tension release routines. It can also include visualizing peaceful scenes. You can also help control troubling or intruding thoughts by keeping your mind occupied with boring or repetitive thoughts like the old concept of counting sheep. You can make it more creative like imagining planting one beautiful flower after another in your backyard garden.  During your day, work to eliminate stress. When this is not possible use some of the previous suggestions to help reduce the anxiety that accompanies stressful situations. MAKE SURE YOU:   Understand these instructions.  Will watch your condition.  Will get help right away if you are not doing well or get worse. Document Released: 11/10/2000 Document Revised: 02/05/2012 Document Reviewed: 12/11/2007 [facility] Patient Information 2015 [place], [place]. This information is not intended to replace advice given to you by your health care provider. Make sure you discuss any questions you have with your health care provider.

## 2014-11-17 LAB — BASIC METABOLIC PANEL WITH GFR
BUN: 20 mg/dL (ref 6–23)
CO2: 30 mEq/L (ref 19–32)
CREATININE: 0.78 mg/dL (ref 0.50–1.10)
Calcium: 9.7 mg/dL (ref 8.4–10.5)
Chloride: 103 mEq/L (ref 96–112)
GFR, Est Non African American: >89 mL/min
Glucose, Bld: 85 mg/dL (ref 70–99)
Potassium: 4.7 mEq/L (ref 3.5–5.3)
Sodium: 138 mEq/L (ref 135–145)

## 2014-11-17 LAB — LIPID PANEL
Cholesterol: 179 mg/dL (ref 0–200)
HDL: 55 mg/dL (ref >39)
LDL Cholesterol: 108 mg/dL — ABNORMAL HIGH (ref 0–99)
TRIGLYCERIDES: 82 mg/dL (ref <150)
Total CHOL/HDL Ratio: 3.3 Ratio
VLDL: 16 mg/dL (ref 0–40)

## 2014-11-17 LAB — TSH: TSH: 0.6 u[IU]/mL (ref 0.350–4.500)

## 2014-11-17 LAB — INSULIN, FASTING: Insulin fasting, serum: 13.5 u[IU]/mL (ref 2.0–19.6)

## 2014-11-17 LAB — HEMOGLOBIN A1C
Hgb A1c MFr Bld: 6.1 % — ABNORMAL HIGH (ref <5.7)
Mean Plasma Glucose: 128 mg/dL — ABNORMAL HIGH (ref <117)

## 2014-11-17 LAB — HEPATIC FUNCTION PANEL
ALBUMIN: 3.9 g/dL (ref 3.5–5.2)
ALK PHOS: 67 U/L (ref 39–117)
ALT: 15 U/L (ref 0–35)
AST: 14 U/L (ref 0–37)
Bilirubin, Direct: 0.1 mg/dL (ref 0.0–0.3)
Indirect Bilirubin: 0.3 mg/dL (ref 0.2–1.2)
TOTAL PROTEIN: 6.6 g/dL (ref 6.0–8.3)
Total Bilirubin: 0.4 mg/dL (ref 0.2–1.2)

## 2014-11-17 LAB — VITAMIN D 25 HYDROXY (VIT D DEFICIENCY, FRACTURES): Vit D, 25-Hydroxy: 22 ng/mL — ABNORMAL LOW (ref 30–100)

## 2014-11-17 LAB — MAGNESIUM: Magnesium: 1.9 mg/dL (ref 1.5–2.5)

## 2015-02-28 IMAGING — CT CT ABD-PELV W/O CM
1 series · 14 of 21 positions shown, 19 images · non-contrast
Comparison: 11/18/2011

CLINICAL DATA: Left-sided flank pain of 1 week duration.

EXAM:
CT ABDOMEN AND PELVIS WITHOUT CONTRAST
TECHNIQUE: Multidetector CT imaging of the abdomen and pelvis was performed
following the standard protocol without intravenous contrast.

[Series 4: lung · axial · 0.80mm/px · z∈[-99,-9]mm · 14 of 21 slices shown, 19 images]
[im 2/21  soft-tissue]
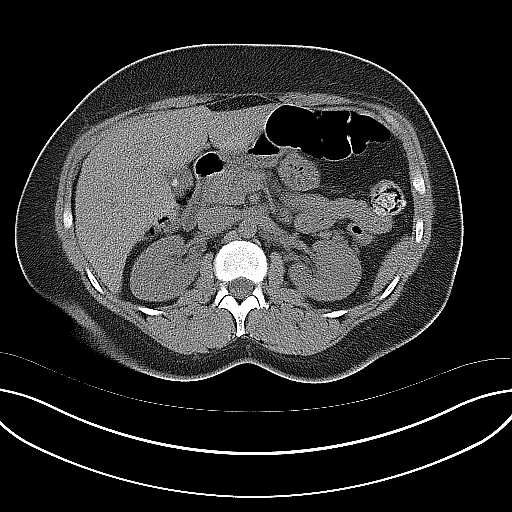
[im 2/21  bone]
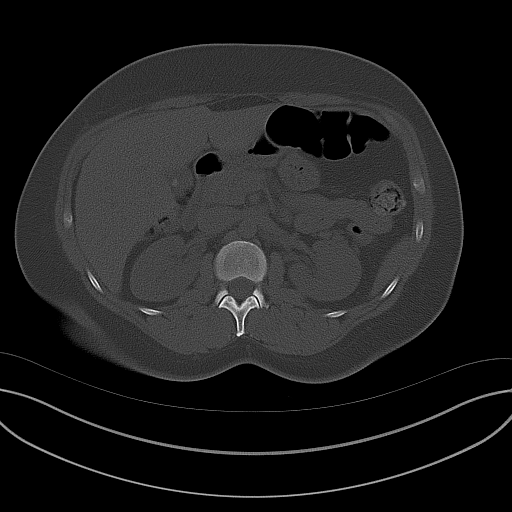
[im 4/21  soft-tissue]
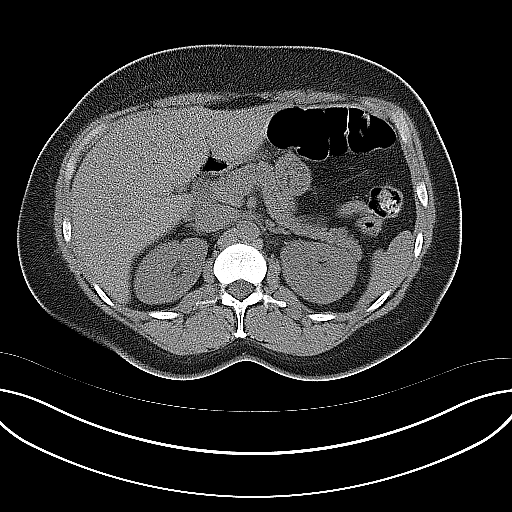
[im 5/21  soft-tissue]
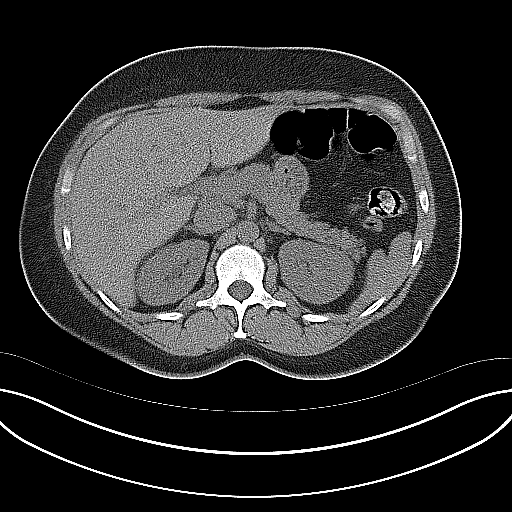
[im 7/21  soft-tissue]
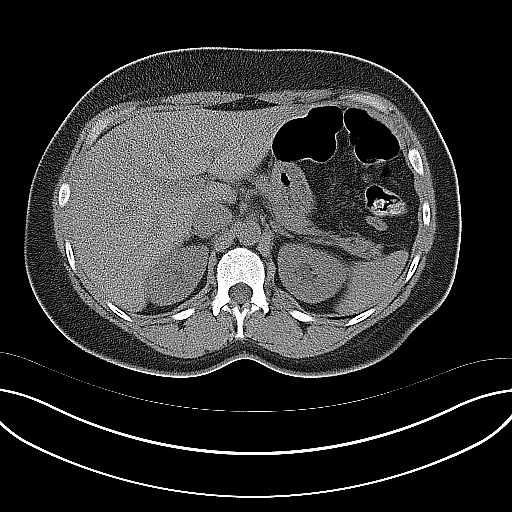
[im 8/21  soft-tissue]
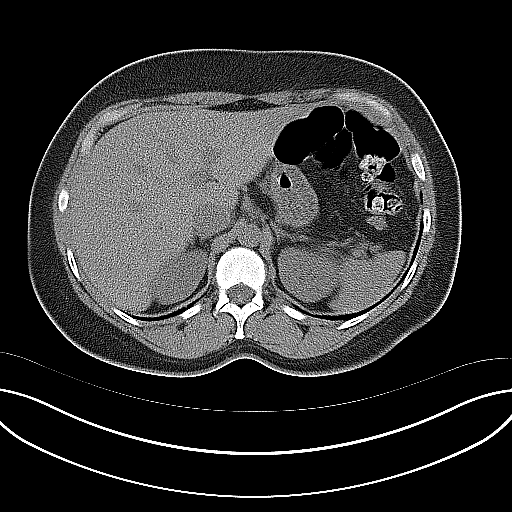
[im 10/21  soft-tissue]
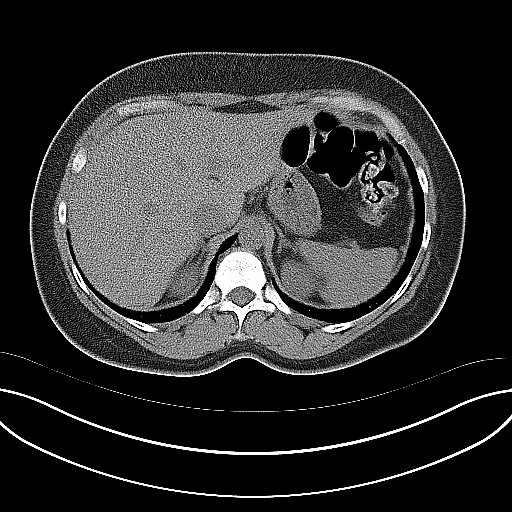
[im 11/21  soft-tissue]
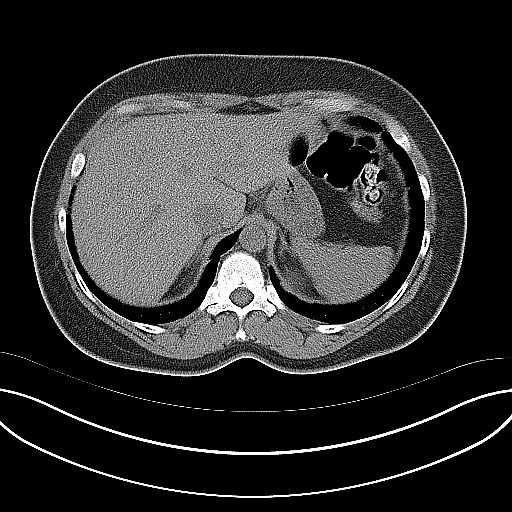
[im 12/21  soft-tissue]
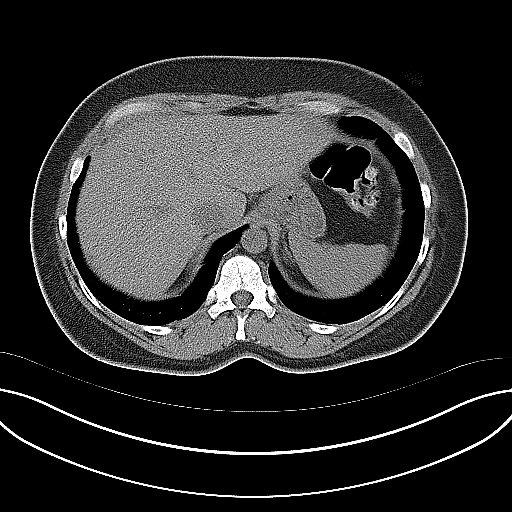
[im 14/21  soft-tissue]
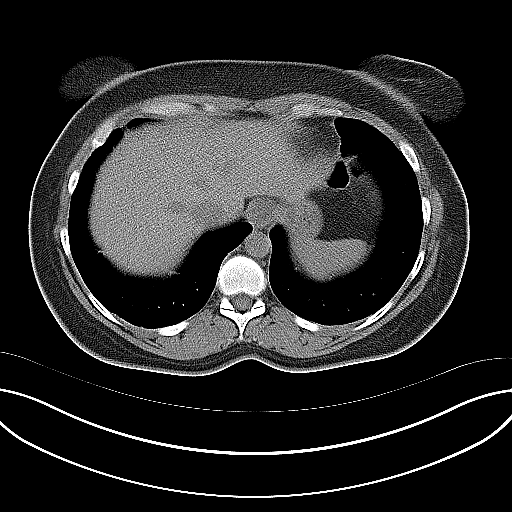
[im 14/21  bone]
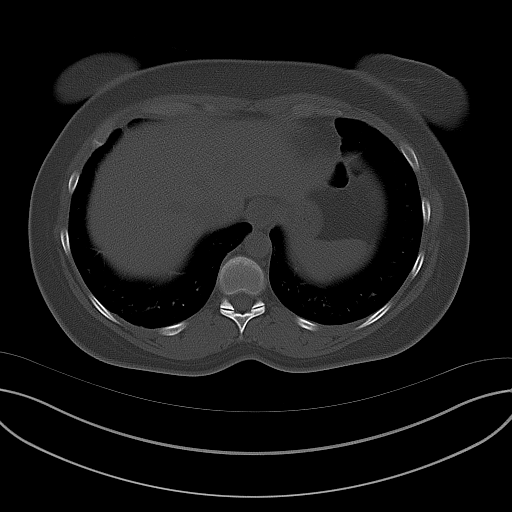
[im 15/21  soft-tissue]
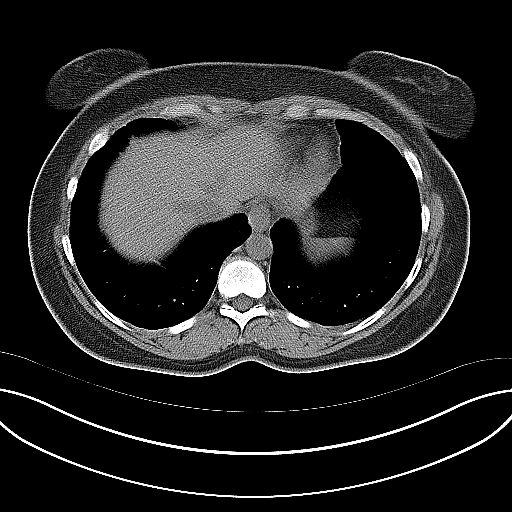
[im 17/21  soft-tissue]
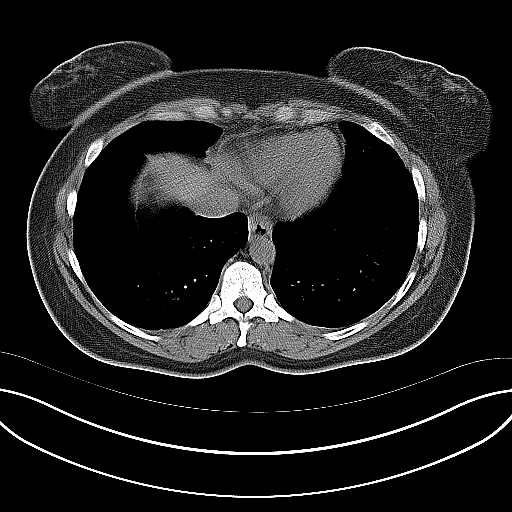
[im 17/21  lung]
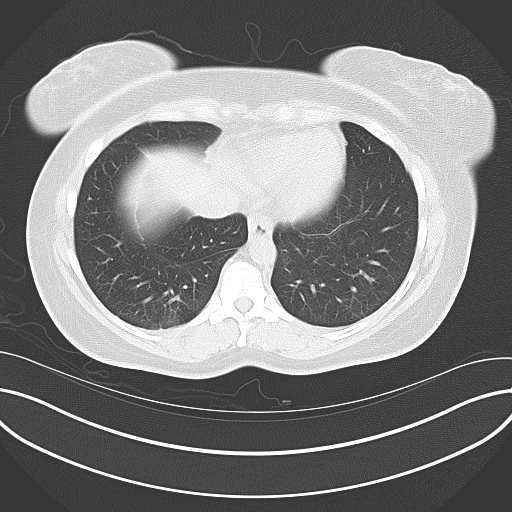
[im 18/21  soft-tissue]
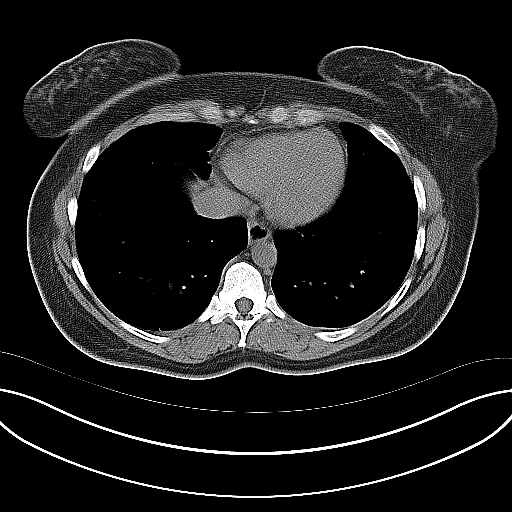
[im 18/21  lung]
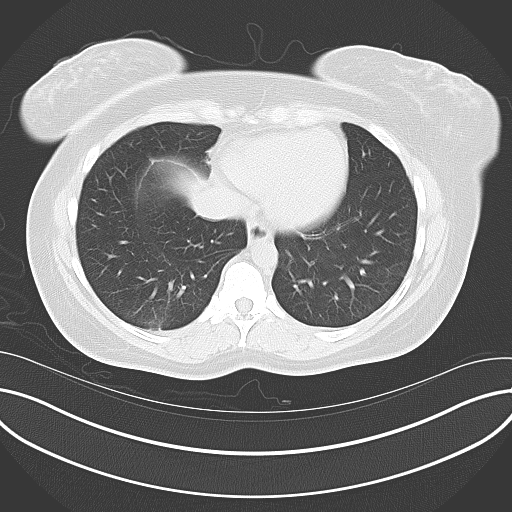
[im 19/21  lung]
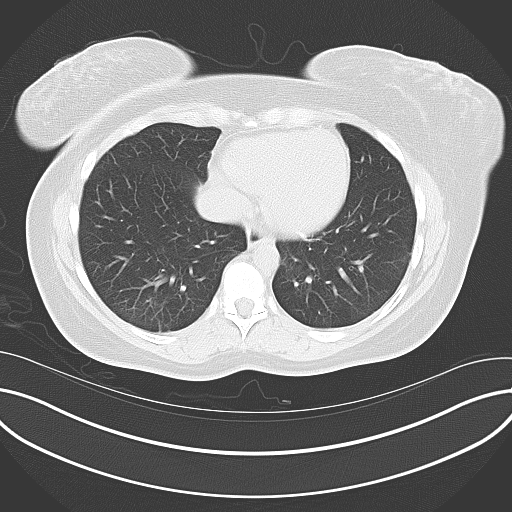
[im 20/21  soft-tissue]
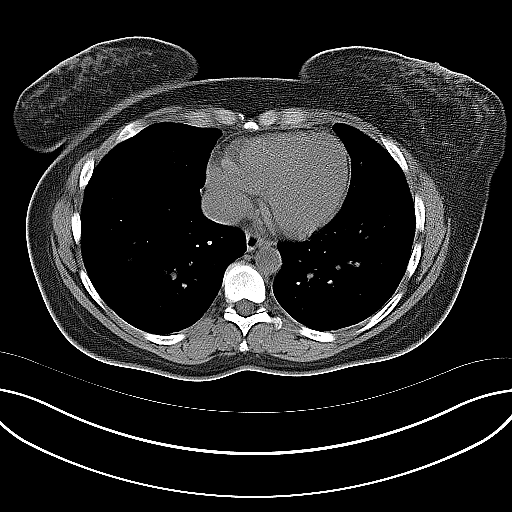
[im 20/21  lung]
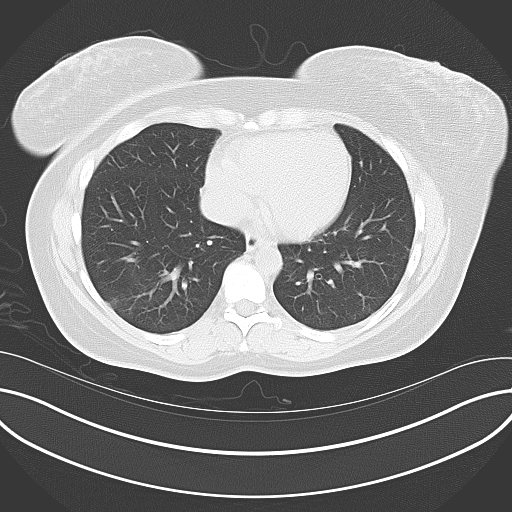

[14 of 21 positions shown; findings below may reference images not displayed]

FINDINGS: Lung bases are clear.  No pleural or pericardial fluid.

The liver has normal appearance without focal lesions or biliary
ductal dilatation. There is a calcified gallstone measuring about 6
mm, unchanged since the previous study. No sign of gallbladder
inflammation or biliary obstruction. The spleen is normal. The
pancreas is normal. The adrenal glands are normal. The right kidney
is normal. No cyst, mass, stone or hydronephrosis. The left kidney
contains 1.5 mm stone in the lower pole as was seen previously. No
sign of hydroureteronephrosis or passing stone. There are multiple
phleboliths in the pelvis.

The aorta and IVC are normal. No retroperitoneal mass or adenopathy.
No bowel pathology is seen. The appendix is normal. The uterus and
adnexal regions are normal. No significant bony finding.
IMPRESSION: No cause of acute pain identified. 1.5 mm nonobstructing stone in
the lower pole of the left kidney. No evidence of passing stone.

5 mm calcified gallstone as seen in 7257 without evidence of
obstruction or inflammation.

## 2015-03-23 ENCOUNTER — Ambulatory Visit (INDEPENDENT_AMBULATORY_CARE_PROVIDER_SITE_OTHER): Payer: 59 | Admitting: Internal Medicine

## 2015-03-23 ENCOUNTER — Encounter: Payer: Self-pay | Admitting: Internal Medicine

## 2015-03-23 VITALS — BP 128/80 | HR 96 | Temp 97.7°F | Resp 16 | Ht 67.0 in | Wt 213.0 lb

## 2015-03-23 DIAGNOSIS — R109 Unspecified abdominal pain: Secondary | ICD-10-CM

## 2015-03-23 MED ORDER — TRAMADOL HCL 50 MG PO TABS: 50.0000 mg | ORAL_TABLET | Freq: Four times a day (QID) | ORAL | Status: DC | PRN

## 2015-03-23 MED ORDER — CYCLOBENZAPRINE HCL 10 MG PO TABS: 10.0000 mg | ORAL_TABLET | Freq: Three times a day (TID) | ORAL | Status: DC | PRN

## 2015-03-23 MED ORDER — CEPHALEXIN 500 MG PO CAPS: 500.0000 mg | ORAL_CAPSULE | Freq: Three times a day (TID) | ORAL | Status: AC

## 2015-03-23 NOTE — Progress Notes (Signed)
Subjective:    Patient ID: Rebecca [name], female    DOB: [birth year], 44 y.o.   MRN: [id number]  Back Pain Pertinent negatives include no abdominal pain, chest pain, dysuria or fever.  Patient reports that she developed back pain late last week on Thursday and it is more left sided and lower back.  She reports that the pain is 8/10 pain.  She reports that she has not had pain like this in a long time.  She reports that the last time that she had pain like this before when she had a kidney infection in the past.  She has not had a history of frequent kidney infections.  She reports no fevers.  She does have occasional chills and nausea.  She reports increased urgency but not frequency or dysuria.  She reports no foul smell or bad colors.  She reports a history of kidney stones in the past. She has been trying ibuprofen at home with little relief.  She reports a dose of 600 mg.  She does report pain with movement.  There is nothing that relieves her pain.      Review of Systems  Constitutional: Negative for fever, chills and fatigue.  Respiratory: Negative for chest tightness and shortness of breath.   Cardiovascular: Negative for chest pain and palpitations.  Gastrointestinal: Positive for nausea. Negative for vomiting, abdominal pain, diarrhea and constipation.  Genitourinary: Positive for urgency and flank pain. Negative for dysuria, frequency, hematuria, decreased urine volume and difficulty urinating.  Musculoskeletal: Positive for back pain.  All other systems reviewed and are negative.      Objective:   Physical Exam  Constitutional: She is oriented to person, place, and time. She appears well-developed and well-nourished. No distress.  HENT:  Head: Normocephalic and atraumatic.  Mouth/Throat: Oropharynx is clear and moist.  Eyes: Conjunctivae and EOM are normal. Pupils are equal, round, and reactive to light. No scleral icterus.  Neck: Normal range of motion. Neck supple. No JVD  present. No thyromegaly present.  Cardiovascular: Normal rate, regular rhythm, normal heart sounds and intact distal pulses.  Exam reveals no gallop and no friction rub.   No murmur heard. Pulmonary/Chest: Effort normal and breath sounds normal. No respiratory distress. She has no wheezes. She has no rales. She exhibits no tenderness.  Abdominal: Soft. Normal appearance and bowel sounds are normal. She exhibits no distension and no mass. There is no tenderness. There is CVA tenderness (left). There is no rebound and no guarding.  Musculoskeletal: Normal range of motion.  Patient rises slowly from sitting to standing.  They walk without an antalgic gait.  There is no evidence of erythema, ecchymosis, or gross deformity.  There is tenderness to palpation over bilateral lumbar paraspinal muscles and left sided thoracic paraspinal muscles.  Active ROM is full but pain with left rotation.  Sensation to light touch is intact over all extremities.  Strength is symmetric and equal in all extremities.    Lymphadenopathy:    She has no cervical adenopathy.  Neurological: She is alert and oriented to person, place, and time. She has normal strength. No cranial nerve deficit or sensory deficit. Coordination normal.  Skin: Skin is warm and dry. She is not diaphoretic.  Psychiatric: She has a normal mood and affect. Her behavior is normal. Judgment and thought content normal.  Nursing note and vitals reviewed.         Assessment & Plan:    1. Flank pain Ddx includes muscle spasm  vs. Muscle strain, early pyelonephritis, nephrolithiasis, UTI   - Urinalysis, Reflex Microscopic - Culture, Urine - cyclobenzaprine (FLEXERIL) 10 MG tablet; Take 1 tablet (10 mg total) by mouth 3 (three) times daily as needed for muscle spasms.  Dispense: 30 tablet; Refill: 1 - traMADol (ULTRAM) 50 MG tablet; Take 1 tablet (50 mg total) by mouth every 6 (six) hours as needed.  Dispense: 20 tablet; Refill: 0 - cephALEXin  (KEFLEX) 500 MG capsule; Take 1 capsule (500 mg total) by mouth 3 (three) times daily.  Dispense: 21 capsule; Refill: 0

## 2015-03-23 NOTE — Patient Instructions (Signed)
Flank Pain °Flank pain refers to pain that is located on the side of the body between the upper abdomen and the back. The pain may occur over a short period of time (acute) or may be long-term or reoccurring (chronic). It may be mild or severe. Flank pain can be caused by many things. °CAUSES  °Some of the more common causes of flank pain include: °· Muscle strains.   °· Muscle spasms.   °· A disease of your spine (vertebral disk disease).   °· A lung infection (pneumonia).   °· Fluid around your lungs (pulmonary edema).   °· A kidney infection.   °· Kidney stones.   °· A very painful skin rash caused by the chickenpox virus (shingles).   °· Gallbladder disease.   °HOME CARE INSTRUCTIONS  °Home care will depend on the cause of your pain. In general, °· Rest as directed by your caregiver. °· Drink enough fluids to keep your urine clear or pale yellow. °· Only take over-the-counter or prescription medicines as directed by your caregiver. Some medicines may help relieve the pain. °· Tell your caregiver about any changes in your pain. °· Follow up with your caregiver as directed. °SEEK IMMEDIATE MEDICAL CARE IF:  °· Your pain is not controlled with medicine.   °· You have new or worsening symptoms. °· Your pain increases.   °· You have abdominal pain.   °· You have shortness of breath.   °· You have persistent nausea or vomiting.   °· You have swelling in your abdomen.   °· You feel faint or pass out.   °· You have blood in your urine. °· You have a fever or persistent symptoms for more than 2-3 days. °· You have a fever and your symptoms suddenly get worse. °MAKE SURE YOU:  °· Understand these instructions. °· Will watch your condition. °· Will get help right away if you are not doing well or get worse. °Document Released: 01/04/2006 Document Revised: 08/07/2012 Document Reviewed: 06/27/2012 °[employer]® Patient Information ©2015 [employer]. This information is not intended to replace advice given to you by your  health care provider. Make sure you discuss any questions you have with your health care provider. ° °

## 2015-03-24 LAB — URINALYSIS, ROUTINE W REFLEX MICROSCOPIC
Bilirubin Urine: NEGATIVE
Glucose, UA: NEGATIVE mg/dL
HGB URINE DIPSTICK: NEGATIVE
Ketones, ur: NEGATIVE mg/dL
Leukocytes, UA: NEGATIVE
NITRITE: NEGATIVE
PH: 6 (ref 5.0–8.0)
Protein, ur: NEGATIVE mg/dL
Specific Gravity, Urine: 1.026 (ref 1.005–1.030)
Urobilinogen, UA: 0.2 mg/dL (ref 0.0–1.0)

## 2015-03-25 LAB — URINE CULTURE: Colony Count: 8000

## 2015-05-05 ENCOUNTER — Encounter: Payer: Self-pay | Admitting: Physician Assistant

## 2015-05-05 ENCOUNTER — Encounter: Payer: Self-pay | Admitting: Emergency Medicine

## 2015-05-10 ENCOUNTER — Other Ambulatory Visit: Payer: Self-pay | Admitting: Emergency Medicine

## 2015-05-10 MED ORDER — PANTOPRAZOLE SODIUM 40 MG PO TBEC: 40.0000 mg | DELAYED_RELEASE_TABLET | Freq: Every day | ORAL | Status: DC

## 2015-05-10 MED ORDER — DULOXETINE HCL 60 MG PO CPEP: 60.0000 mg | ORAL_CAPSULE | Freq: Every day | ORAL | Status: DC

## 2015-06-03 ENCOUNTER — Ambulatory Visit (INDEPENDENT_AMBULATORY_CARE_PROVIDER_SITE_OTHER): Payer: 59 | Admitting: Internal Medicine

## 2015-06-03 ENCOUNTER — Encounter: Payer: Self-pay | Admitting: Internal Medicine

## 2015-06-03 VITALS — BP 138/86 | HR 86 | Temp 98.0°F | Resp 18 | Ht 67.0 in | Wt 213.0 lb

## 2015-06-03 DIAGNOSIS — I10 Essential (primary) hypertension: Secondary | ICD-10-CM

## 2015-06-03 DIAGNOSIS — E782 Mixed hyperlipidemia: Secondary | ICD-10-CM

## 2015-06-03 DIAGNOSIS — Z1212 Encounter for screening for malignant neoplasm of rectum: Secondary | ICD-10-CM

## 2015-06-03 DIAGNOSIS — R7309 Other abnormal glucose: Secondary | ICD-10-CM

## 2015-06-03 DIAGNOSIS — R5383 Other fatigue: Secondary | ICD-10-CM

## 2015-06-03 DIAGNOSIS — E559 Vitamin D deficiency, unspecified: Secondary | ICD-10-CM

## 2015-06-03 DIAGNOSIS — E669 Obesity, unspecified: Secondary | ICD-10-CM

## 2015-06-03 DIAGNOSIS — Z79899 Other long term (current) drug therapy: Secondary | ICD-10-CM

## 2015-06-03 DIAGNOSIS — R7303 Prediabetes: Secondary | ICD-10-CM

## 2015-06-03 LAB — HEPATIC FUNCTION PANEL
ALBUMIN: 3.9 g/dL (ref 3.5–5.2)
ALK PHOS: 84 U/L (ref 39–117)
ALT: 18 U/L (ref 0–35)
AST: 14 U/L (ref 0–37)
BILIRUBIN DIRECT: 0.1 mg/dL (ref 0.0–0.3)
BILIRUBIN TOTAL: 0.6 mg/dL (ref 0.2–1.2)
Indirect Bilirubin: 0.5 mg/dL (ref 0.2–1.2)
Total Protein: 6.5 g/dL (ref 6.0–8.3)

## 2015-06-03 LAB — MAGNESIUM: MAGNESIUM: 2 mg/dL (ref 1.5–2.5)

## 2015-06-03 LAB — IRON AND TIBC
%SAT: 32 % (ref 20–55)
Iron: 96 ug/dL (ref 42–145)
TIBC: 303 ug/dL (ref 250–470)
UIBC: 207 ug/dL (ref 125–400)

## 2015-06-03 LAB — TSH: TSH: 1.383 u[IU]/mL (ref 0.350–4.500)

## 2015-06-03 LAB — CBC WITH DIFFERENTIAL/PLATELET
Basophils Absolute: 0 10*3/uL (ref 0.0–0.1)
Basophils Relative: 0 % (ref 0–1)
EOS ABS: 0.2 10*3/uL (ref 0.0–0.7)
EOS PCT: 3 % (ref 0–5)
HCT: 38.4 % (ref 36.0–46.0)
Hemoglobin: 12.7 g/dL (ref 12.0–15.0)
LYMPHS ABS: 2.5 10*3/uL (ref 0.7–4.0)
LYMPHS PCT: 49 % — AB (ref 12–46)
MCH: 29.5 pg (ref 26.0–34.0)
MCHC: 33.1 g/dL (ref 30.0–36.0)
MCV: 89.1 fL (ref 78.0–100.0)
MPV: 9.7 fL (ref 8.6–12.4)
Monocytes Absolute: 0.5 10*3/uL (ref 0.1–1.0)
Monocytes Relative: 10 % (ref 3–12)
NEUTROS PCT: 38 % — AB (ref 43–77)
Neutro Abs: 1.9 10*3/uL (ref 1.7–7.7)
Platelets: 249 10*3/uL (ref 150–400)
RBC: 4.31 MIL/uL (ref 3.87–5.11)
RDW: 13.7 % (ref 11.5–15.5)
WBC: 5.1 10*3/uL (ref 4.0–10.5)

## 2015-06-03 LAB — VITAMIN B12: VITAMIN B 12: 598 pg/mL (ref 211–911)

## 2015-06-03 LAB — BASIC METABOLIC PANEL WITH GFR
BUN: 10 mg/dL (ref 6–23)
CHLORIDE: 104 meq/L (ref 96–112)
CO2: 28 mEq/L (ref 19–32)
CREATININE: 0.75 mg/dL (ref 0.50–1.10)
Calcium: 9.4 mg/dL (ref 8.4–10.5)
GFR, Est African American: >89 mL/min
Glucose, Bld: 91 mg/dL (ref 70–99)
Potassium: 4.4 mEq/L (ref 3.5–5.3)
Sodium: 140 mEq/L (ref 135–145)

## 2015-06-03 LAB — LIPID PANEL
Cholesterol: 179 mg/dL (ref 0–200)
HDL: 45 mg/dL — AB (ref >=46)
LDL Cholesterol: 104 mg/dL — ABNORMAL HIGH (ref 0–99)
Total CHOL/HDL Ratio: 4 Ratio
Triglycerides: 152 mg/dL — ABNORMAL HIGH (ref <150)
VLDL: 30 mg/dL (ref 0–40)

## 2015-06-03 LAB — HEMOGLOBIN A1C
Hgb A1c MFr Bld: 6.1 % — ABNORMAL HIGH (ref <5.7)
MEAN PLASMA GLUCOSE: 128 mg/dL — AB (ref <117)

## 2015-06-03 MED ORDER — PHENTERMINE HCL 37.5 MG PO TABS: 37.5000 mg | ORAL_TABLET | Freq: Every day | ORAL | Status: DC

## 2015-06-03 NOTE — Patient Instructions (Signed)
GETTING OFF OF PPI's    Nexium/protonix/prilosec/Omeprazole/Dexilant/Aciphex are called PPI's, they are great at healing your stomach but should only be taken for a short period of time.     Recent studies have shown that taken for a long time they  can increase the risk of osteoporosis (weakening of your bones), pneumonia, low magnesium, restless legs, Cdiff (infection that causes diarrhea), DEMENTIA and most recently kidney damage / disease / insufficiency.     Due to this information we want to try to stop the PPI but if you try to stop it abruptly this can cause rebound acid and worsening symptoms.   So this is how we want you to get off the PPI:  - Start taking the nexium/protonix/prilosec/PPI  every other day with  zantac (ranitidine) 2 x a day for 2-4 weeks  - then decrease the PPI to every 3 days while taking the zantac (ranitidine) twice a day the other  days for 2-4  Weeks  - then you can try the zantac (ranitidine) once at night or up to 2 x day as needed.  - you can continue on this once at night or stop all together  - Avoid alcohol, spicy foods, NSAIDS (aleve, ibuprofen) at this time. See foods below.   +++++++++++++++++++++++++++++++++++++++++++  Food Choices for Gastroesophageal Reflux Disease  When you have gastroesophageal reflux disease (GERD), the foods you eat and your eating habits are very important. Choosing the right foods can help ease the discomfort of GERD. WHAT GENERAL GUIDELINES DO I NEED TO FOLLOW?  Choose fruits, vegetables, whole grains, low-fat dairy products, and low-fat meat, fish, and poultry.  Limit fats such as oils, salad dressings, butter, nuts, and avocado.  Keep a food diary to identify foods that cause symptoms.  Avoid foods that cause reflux. These may be different for different people.  Eat frequent small meals instead of three large meals each day.  Eat your meals slowly, in a relaxed setting.  Limit fried foods.  Cook foods  using methods other than frying.  Avoid drinking alcohol.  Avoid drinking large amounts of liquids with your meals.  Avoid bending over or lying down until 2-3 hours after eating.   WHAT FOODS ARE NOT RECOMMENDED? The following are some foods and drinks that may worsen your symptoms:  Vegetables Tomatoes. Tomato juice. Tomato and spaghetti sauce. Chili peppers. Onion and garlic. Horseradish. Fruits Oranges, grapefruit, and lemon (fruit and juice). Meats High-fat meats, fish, and poultry. This includes hot dogs, ribs, ham, sausage, salami, and bacon. Dairy Whole milk and chocolate milk. Sour cream. Cream. Butter. Ice cream. Cream cheese.  Beverages Coffee and tea, with or without caffeine. Carbonated beverages or energy drinks. Condiments Hot sauce. Barbecue sauce.  Sweets/Desserts Chocolate and cocoa. Donuts. Peppermint and spearmint. Fats and Oils High-fat foods, including [place] fries and potato chips. Other Vinegar. Strong spices, such as black pepper, white pepper, red pepper, cayenne, curry powder, cloves, ginger, and chili powder. Nexium/protonix/prilosec are called PPI's, they are great at healing your stomach but should only be taken for a short period of time.    Preventive Care for Adults  A healthy lifestyle and preventive care can promote health and wellness. Preventive health guidelines for women include the following key practices.  A routine yearly physical is a good way to check with your health care provider about your health and preventive screening. It is a chance to share any concerns and updates on your health and to receive a thorough exam.  Visit your dentist for a routine exam and preventive care every 6 months. Brush your teeth twice a day and floss once a day. Good oral hygiene prevents tooth decay and gum disease.  The frequency of eye exams is based on your age, health, family medical history, use of contact lenses, and other factors. Follow your  health care provider's recommendations for frequency of eye exams.  Eat a healthy diet. Foods like vegetables, fruits, whole grains, low-fat dairy products, and lean protein foods contain the nutrients you need without too many calories. Decrease your intake of foods high in solid fats, added sugars, and salt. Eat the right amount of calories for you.Get information about a proper diet from your health care provider, if necessary.  Regular physical exercise is one of the most important things you can do for your health. Most adults should get at least 150 minutes of moderate-intensity exercise (any activity that increases your heart rate and causes you to sweat) each week. In addition, most adults need muscle-strengthening exercises on 2 or more days a week.  Maintain a healthy weight. The body mass index (BMI) is a screening tool to identify possible weight problems. It provides an estimate of body fat based on height and weight. Your health care provider can find your BMI and can help you achieve or maintain a healthy weight.For adults 20 years and older:  A BMI below 18.5 is considered underweight.  A BMI of 18.5 to 24.9 is normal.  A BMI of 25 to 29.9 is considered overweight.  A BMI of 30 and above is considered obese.  Maintain normal blood lipids and cholesterol levels by exercising and minimizing your intake of saturated fat. Eat a balanced diet with plenty of fruit and vegetables. Blood tests for lipids and cholesterol should begin at age 56 and be repeated every 5 years. If your lipid or cholesterol levels are high, you are over 50, or you are at high risk for heart disease, you may need your cholesterol levels checked more frequently.Ongoing high lipid and cholesterol levels should be treated with medicines if diet and exercise are not working.  If you smoke, find out from your health care provider how to quit. If you do not use tobacco, do not start.  Lung cancer screening is  recommended for adults aged 21-80 years who are at high risk for developing lung cancer because of a history of smoking. A yearly low-dose CT scan of the lungs is recommended for people who have at least a 30-pack-year history of smoking and are a current smoker or have quit within the past 15 years. A pack year of smoking is smoking an average of 1 pack of cigarettes a day for 1 year (for example: 1 pack a day for 30 years or 2 packs a day for 15 years). Yearly screening should continue until the smoker has stopped smoking for at least 15 years. Yearly screening should be stopped for people who develop a health problem that would prevent them from having lung cancer treatment.  High blood pressure causes heart disease and increases the risk of stroke. Your blood pressure should be checked at least every 1 to 2 years. Ongoing high blood pressure should be treated with medicines if weight loss and exercise do not work.  If you are 41-59 years old, ask your health care provider if you should take aspirin to prevent strokes.  Diabetes screening involves taking a blood sample to check your fasting blood sugar level. This  should be done once every 3 years, after age 32, if you are within normal weight and without risk factors for diabetes. Testing should be considered at a younger age or be carried out more frequently if you are overweight and have at least 1 risk factor for diabetes.  Breast cancer screening is essential preventive care for women. You should practice "breast self-awareness." This means understanding the normal appearance and feel of your breasts and may include breast self-examination. Any changes detected, no matter how small, should be reported to a health care provider. Women in their 23s and 30s should have a clinical breast exam (CBE) by a health care provider as part of a regular health exam every 1 to 3 years. After age 65, women should have a CBE every year. Starting at age 78, women  should consider having a mammogram (breast X-ray test) every year. Women who have a family history of breast cancer should talk to their health care provider about genetic screening. Women at a high risk of breast cancer should talk to their health care providers about having an MRI and a mammogram every year.  Breast cancer gene (BRCA)-related cancer risk assessment is recommended for women who have family members with BRCA-related cancers. BRCA-related cancers include breast, ovarian, tubal, and peritoneal cancers. Having family members with these cancers may be associated with an increased risk for harmful changes (mutations) in the breast cancer genes BRCA1 and BRCA2. Results of the assessment will determine the need for genetic counseling and BRCA1 and BRCA2 testing.  Routine pelvic exams to screen for cancer are no longer recommended for nonpregnant women who are considered low risk for cancer of the pelvic organs (ovaries, uterus, and vagina) and who do not have symptoms. Ask your health care provider if a screening pelvic exam is right for you.  If you have had past treatment for cervical cancer or a condition that could lead to cancer, you need Pap tests and screening for cancer for at least 20 years after your treatment. If Pap tests have been discontinued, your risk factors (such as having a new sexual partner) need to be reassessed to determine if screening should be resumed. Some women have medical problems that increase the chance of getting cervical cancer. In these cases, your health care provider may recommend more frequent screening and Pap tests.  Colorectal cancer can be detected and often prevented. Most routine colorectal cancer screening begins at the age of 26 years and continues through age 47 years. However, your health care provider may recommend screening at an earlier age if you have risk factors for colon cancer. On a yearly basis, your health care provider may provide home test  kits to check for hidden blood in the stool. Use of a small camera at the end of a tube, to directly examine the colon (sigmoidoscopy or colonoscopy), can detect the earliest forms of colorectal cancer. Talk to your health care provider about this at age 60, when routine screening begins. Direct exam of the colon should be repeated every 5-10 years through age 8 years, unless early forms of pre-cancerous polyps or small growths are found.  Hepatitis C blood testing is recommended for all people born from [birth year] through 1965 and any individual with known risks for hepatitis C.  Pra  Osteoporosis is a disease in which the bones lose minerals and strength with aging. This can result in serious bone fractures or breaks. The risk of osteoporosis can be identified using a bone density  scan. Women ages 34 years and over and women at risk for fractures or osteoporosis should discuss screening with their health care providers. Ask your health care provider whether you should take a calcium supplement or vitamin D to reduce the rate of osteoporosis.  Menopause can be associated with physical symptoms and risks. Hormone replacement therapy is available to decrease symptoms and risks. You should talk to your health care provider about whether hormone replacement therapy is right for you.  Use sunscreen. Apply sunscreen liberally and repeatedly throughout the day. You should seek shade when your shadow is shorter than you. Protect yourself by wearing long sleeves, pants, a wide-brimmed hat, and sunglasses year round, whenever you are outdoors.  Once a month, do a whole body skin exam, using a mirror to look at the skin on your back. Tell your health care provider of new moles, moles that have irregular borders, moles that are larger than a pencil eraser, or moles that have changed in shape or color.  Stay current with required vaccines (immunizations).  Influenza vaccine. All adults should be immunized every  year.  Tetanus, diphtheria, and acellular pertussis (Td, Tdap) vaccine. Pregnant women should receive 1 dose of Tdap vaccine during each pregnancy. The dose should be obtained regardless of the length of time since the last dose. Immunization is preferred during the 27th-36th week of gestation. An adult who has not previously received Tdap or who does not know her vaccine status should receive 1 dose of Tdap. This initial dose should be followed by tetanus and diphtheria toxoids (Td) booster doses every 10 years. Adults with an unknown or incomplete history of completing a 3-dose immunization series with Td-containing vaccines should begin or complete a primary immunization series including a Tdap dose. Adults should receive a Td booster every 10 years.  Varicella vaccine. An adult without evidence of immunity to varicella should receive 2 doses or a second dose if she has previously received 1 dose. Pregnant females who do not have evidence of immunity should receive the first dose after pregnancy. This first dose should be obtained before leaving the health care facility. The second dose should be obtained 4-8 weeks after the first dose.  Human papillomavirus (HPV) vaccine. Females aged 13-26 years who have not received the vaccine previously should obtain the 3-dose series. The vaccine is not recommended for use in pregnant females. However, pregnancy testing is not needed before receiving a dose. If a female is found to be pregnant after receiving a dose, no treatment is needed. In that case, the remaining doses should be delayed until after the pregnancy. Immunization is recommended for any person with an immunocompromised condition through the age of 42 years if she did not get any or all doses earlier. During the 3-dose series, the second dose should be obtained 4-8 weeks after the first dose. The third dose should be obtained 24 weeks after the first dose and 16 weeks after the second dose.  Zoster  vaccine. One dose is recommended for adults aged 43 years or older unless certain conditions are present.  Measles, mumps, and rubella (MMR) vaccine. Adults born before [birth year] generally are considered immune to measles and mumps. Adults born in [birth year] or later should have 1 or more doses of MMR vaccine unless there is a contraindication to the vaccine or there is laboratory evidence of immunity to each of the three diseases. A routine second dose of MMR vaccine should be obtained at least 28 days after the first  dose for students attending postsecondary schools, health care workers, or international travelers. People who received inactivated measles vaccine or an unknown type of measles vaccine during 1963-1967 should receive 2 doses of MMR vaccine. People who received inactivated mumps vaccine or an unknown type of mumps vaccine before 1979 and are at high risk for mumps infection should consider immunization with 2 doses of MMR vaccine. For females of childbearing age, rubella immunity should be determined. If there is no evidence of immunity, females who are not pregnant should be vaccinated. If there is no evidence of immunity, females who are pregnant should delay immunization until after pregnancy. Unvaccinated health care workers born before [birth year] who lack laboratory evidence of measles, mumps, or rubella immunity or laboratory confirmation of disease should consider measles and mumps immunization with 2 doses of MMR vaccine or rubella immunization with 1 dose of MMR vaccine.  Pneumococcal 13-valent conjugate (PCV13) vaccine. When indicated, a person who is uncertain of her immunization history and has no record of immunization should receive the PCV13 vaccine. An adult aged 38 years or older who has certain medical conditions and has not been previously immunized should receive 1 dose of PCV13 vaccine. This PCV13 should be followed with a dose of pneumococcal polysaccharide (PPSV23) vaccine. The PPSV23  vaccine dose should be obtained at least 8 weeks after the dose of PCV13 vaccine. An adult aged 50 years or older who has certain medical conditions and previously received 1 or more doses of PPSV23 vaccine should receive 1 dose of PCV13. The PCV13 vaccine dose should be obtained 1 or more years after the last PPSV23 vaccine dose.    Pneumococcal polysaccharide (PPSV23) vaccine. When PCV13 is also indicated, PCV13 should be obtained first. All adults aged 69 years and older should be immunized. An adult younger than age 17 years who has certain medical conditions should be immunized. Any person who resides in a nursing home or long-term care facility should be immunized. An adult smoker should be immunized. People with an immunocompromised condition and certain other conditions should receive both PCV13 and PPSV23 vaccines. People with human immunodeficiency virus (HIV) infection should be immunized as soon as possible after diagnosis. Immunization during chemotherapy or radiation therapy should be avoided. Routine use of PPSV23 vaccine is not recommended for [place] Indians, [place]atives, or people younger than 65 years unless there are medical conditions that require PPSV23 vaccine. When indicated, people who have unknown immunization and have no record of immunization should receive PPSV23 vaccine. One-time revaccination 5 years after the first dose of PPSV23 is recommended for people aged 19-64 years who have chronic kidney failure, nephrotic syndrome, asplenia, or immunocompromised conditions. People who received 1-2 doses of PPSV23 before age 73 years should receive another dose of PPSV23 vaccine at age 5 years or later if at least 5 years have passed since the previous dose. Doses of PPSV23 are not needed for people immunized with PPSV23 at or after age 45 years.  Preventive Services / Frequency   Ages 37 to 17 years  Blood pressure check.  Lipid and cholesterol check.  Lung cancer  screening. / Every year if you are aged 55-80 years and have a 30-pack-year history of smoking and currently smoke or have quit within the past 15 years. Yearly screening is stopped once you have quit smoking for at least 15 years or develop a health problem that would prevent you from having lung cancer treatment.  Clinical breast exam.** / Every year after age 46  years.  BRCA-related cancer risk assessment.** / For women who have family members with a BRCA-related cancer (breast, ovarian, tubal, or peritoneal cancers).  Mammogram.** / Every year beginning at age 46 years and continuing for as long as you are in good health. Consult with your health care provider.  Pap test.** / Every 3 years starting at age 34 years through age 60 or 42 years with a history of 3 consecutive normal Pap tests.  HPV screening.** / Every 3 years from ages 37 years through ages 76 to 44 years with a history of 3 consecutive normal Pap tests.  Fecal occult blood test (FOBT) of stool. / Every year beginning at age 63 years and continuing until age 93 years. You may not need to do this test if you get a colonoscopy every 10 years.  Flexible sigmoidoscopy or colonoscopy.** / Every 5 years for a flexible sigmoidoscopy or every 10 years for a colonoscopy beginning at age 34 years and continuing until age 61 years.  Hepatitis C blood test.** / For all people born from [birth year] through 1967 and any individual with known risks for hepatitis C.  Skin self-exam. / Monthly.  Influenza vaccine. / Every year.  Tetanus, diphtheria, and acellular pertussis (Tdap/Td) vaccine.** / Consult your health care provider. Pregnant women should receive 1 dose of Tdap vaccine during each pregnancy. 1 dose of Td every 10 years.  Varicella vaccine.** / Consult your health care provider. Pregnant females who do not have evidence of immunity should receive the first dose after pregnancy.  Zoster vaccine.** / 1 dose for adults aged 52 years or  older.  Pneumococcal 13-valent conjugate (PCV13) vaccine.** / Consult your health care provider.  Pneumococcal polysaccharide (PPSV23) vaccine.** / 1 to 2 doses if you smoke cigarettes or if you have certain conditions.  Meningococcal vaccine.** / Consult your health care provider.  Hepatitis A vaccine.** / Consult your health care provider.  Hepatitis B vaccine.** / Consult your health care provider. Screening for abdominal aortic aneurysm (AAA)  by ultrasound is recommended for people over 50 who have history of high blood pressure or who are current or former smokers.  Phentermine tablets or capsules What is this medicine? PHENTERMINE (FEN ter meen) decreases your appetite. It is used with a reduced calorie diet and exercise to help you lose weight. This medicine may be used for other purposes; ask your health care provider or pharmacist if you have questions. COMMON BRAND NAME(S): Adipex-P, Atti-Plex P, Atti-Plex P Spansule, Fastin, Pro-Fast, Tara-8 What should I tell my health care provider before I take this medicine? They need to know if you have any of these conditions: -agitation -glaucoma -heart disease -high blood pressure -history of substance abuse -lung disease called Primary Pulmonary Hypertension (PPH) -taken an MAOI like Carbex, Eldepryl, Marplan, Nardil, or Parnate in last 14 days -thyroid disease -an unusual or allergic reaction to phentermine, other medicines, foods, dyes, or preservatives -pregnant or trying to get pregnant -breast-feeding How should I use this medicine? Take this medicine by mouth with a glass of water. Follow the directions on the prescription label. This medicine is usually taken 30 minutes before or 1 to 2 hours after breakfast. Avoid taking this medicine in the evening. It may interfere with sleep. Take your doses at regular intervals. Do not take your medicine more often than directed. Talk to your pediatrician regarding the use of this  medicine in children. Special care may be needed. Overdosage: If you think you have taken too much  of this medicine contact a poison control center or emergency room at once. NOTE: This medicine is only for you. Do not share this medicine with others. What if I miss a dose? If you miss a dose, take it as soon as you can. If it is almost time for your next dose, take only that dose. Do not take double or extra doses. What may interact with this medicine? Do not take this medicine with any of the following medications: -duloxetine -MAOIs like Carbex, Eldepryl, Marplan, Nardil, and Parnate -medicines for colds or breathing difficulties like pseudoephedrine or phenylephrine -procarbazine -sibutramine -SSRIs like citalopram, escitalopram, fluoxetine, fluvoxamine, paroxetine, and sertraline -stimulants like dexmethylphenidate, methylphenidate or modafinil -venlafaxine This medicine may also interact with the following medications: -medicines for diabetes This list may not describe all possible interactions. Give your health care provider a list of all the medicines, herbs, non-prescription drugs, or dietary supplements you use. Also tell them if you smoke, drink alcohol, or use illegal drugs. Some items may interact with your medicine. What should I watch for while using this medicine? Notify your physician immediately if you become short of breath while doing your normal activities. Do not take this medicine within 6 hours of bedtime. It can keep you from getting to sleep. Avoid drinks that contain caffeine and try to stick to a regular bedtime every night. This medicine was intended to be used in addition to a healthy diet and exercise. The best results are achieved this way. This medicine is only indicated for short-term use. Eventually your weight loss may level out. At that point, the drug will only help you maintain your new weight. Do not increase or in any way change your dose without  consulting your doctor. You may get drowsy or dizzy. Do not drive, use machinery, or do anything that needs mental alertness until you know how this medicine affects you. Do not stand or sit up quickly, especially if you are an older patient. This reduces the risk of dizzy or fainting spells. Alcohol may increase dizziness and drowsiness. Avoid alcoholic drinks. What side effects may I notice from receiving this medicine? Side effects that you should report to your doctor or health care professional as soon as possible: -chest pain, palpitations -depression or severe changes in mood -increased blood pressure -irritability -nervousness or restlessness -severe dizziness -shortness of breath -problems urinating -unusual swelling of the legs -vomiting Side effects that usually do not require medical attention (report to your doctor or health care professional if they continue or are bothersome): -blurred vision or other eye problems -changes in sexual ability or desire -constipation or diarrhea -difficulty sleeping -dry mouth or unpleasant taste -headache -nausea This list may not describe all possible side effects. Call your doctor for medical advice about side effects. You may report side effects to FDA at 1-800-FDA-1088. Where should I keep my medicine? Keep out of the reach of children. This medicine can be abused. Keep your medicine in a safe place to protect it from theft. Do not share this medicine with anyone. Selling or giving away this medicine is dangerous and against the law. Store at room temperature between 20 and 25 degrees C (68 and 77 degrees F). Keep container tightly closed. Throw away any unused medicine after the expiration date. NOTE: This sheet is a summary. It may not cover all possible information. If you have questions about this medicine, talk to your doctor, pharmacist, or health care provider.  2015, Elsevier/Gold Standard. (2010-12-28 11:02:44)

## 2015-06-03 NOTE — Progress Notes (Signed)
Complete Physical  Assessment and Plan:   1. Essential hypertension -baby ASA -monitor at home on phentermine -cont diet and exercise - Urinalysis, Routine w reflex microscopic (not at [facility]) - Microalbumin / creatinine urine ratio - EKG 12-Lead - [place], RETROPERITNL ABD,  LTD - TSH  2. Prediabetes  - Hemoglobin A1c - Insulin, random  3. Mixed hyperlipidemia -diet and exercise - Lipid panel  4. Vitamin D deficiency -cont supplement - Vit D  25 hydroxy (rtn osteoporosis monitoring)  5. Obesity -recheck in 1 month - phentermine (ADIPEX-P) 37.5 MG tablet; Take 1 tablet (37.5 mg total) by mouth daily before breakfast.  Dispense: 30 tablet; Refill: 0  6. Medication management  - CBC with Differential/Platelet - BASIC METABOLIC PANEL WITH GFR - Hepatic function panel - Magnesium  7. Other fatigue  - Iron and TIBC - Vitamin B12  8. Screening for rectal cancer  - POC Hemoccult Bld/Stl (3-Cd Home Screen); Future    Discussed med's effects and SE's. Screening labs and tests as requested with regular follow-up as recommended.  HPI  44 y.o. female  presents for a complete physical.  Her blood pressure has been controlled at home, today their BP is BP: 138/86 mmHg.  She does workout.  She has been walking.  She reports that she is trying to walk twice weekly.   She denies chest pain, shortness of breath, dizziness.   She is not on cholesterol medication and denies myalgias. Her cholesterol is not at goal. The cholesterol last visit was:  Lab Results  Component Value Date   CHOL 179 11/16/2014   HDL 55 11/16/2014   LDLCALC 108* 11/16/2014   TRIG 82 11/16/2014   CHOLHDL 3.3 11/16/2014  .  She has been working on diet and exercise for prediabetes, she is on bASA, she is not on ACE/ARB and denies foot ulcerations, hyperglycemia, hypoglycemia , increased appetite, nausea, paresthesia of the feet, polydipsia, polyuria, visual disturbances, vomiting and weight loss. Last  A1C in the office was:  Lab Results  Component Value Date   HGBA1C 6.1* 11/16/2014    Patient is on Vitamin D supplement.   Lab Results  Component Value Date   VD25OH 22* 11/16/2014     She reports that she is concerned about her weight.  She reports that she is drinking plenty of water and is trying to walk a lot but she feels like it has not helped a whole lot.  She reports that she is eating a lot of veggies.  She reports that yesterday for lunch she had squash, green beans, and also another vegetable.  She does report that she eats oatmeal or a boiled egg.    She reports that her heartburn is under control.  She reports that she still takes it near daily.    Current Medications:  Current Outpatient Prescriptions on File Prior to Visit  Medication Sig Dispense Refill  . cyclobenzaprine (FLEXERIL) 10 MG tablet Take 1 tablet (10 mg total) by mouth 3 (three) times daily as needed for muscle spasms. 30 tablet 1  . DULoxetine (CYMBALTA) 60 MG capsule Take 1 capsule (60 mg total) by mouth daily. 90 capsule 1  . gabapentin (NEURONTIN) 300 MG capsule 1-2 pills at night 1-2 hours before bed for sleep/leg pain 60 capsule 0  . pantoprazole (PROTONIX) 40 MG tablet Take 1 tablet (40 mg total) by mouth daily. 90 tablet 1   No current facility-administered medications on file prior to visit.    Health Maintenance:  Immunization History  Administered Date(s) Administered  . Tdap 02/27/2013   Dr. Truman Hayward is optometrist-Jan 2016 Dentist visits as needed  Patient Care Team: Unk Pinto, MD as PCP - General (Internal Medicine) Minette Headland, MD as Referring Physician ([place]) Rutherford Guys, MD as Consulting Physician (Ophthalmology) Jerene Bears, MD as Consulting Physician (Gastroenterology) Servando Salina, MD as Consulting Physician (Obstetrics and Gynecology) Kennith Center, RD as Dietitian (Family Medicine)  Allergies: No Known Allergies  Medical History:  Past  Medical History  Diagnosis Date  . 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 PUD (peptic ulcer disease)   . Hiatal hernia   . Duodenitis   . Esophagitis   . Vitamin D deficiency   . Fatigue   . Sacroiliac pain   . Depression   . Anemia   . GERD (gastroesophageal reflux disease)   . Obesity   . Gallstones   . Colon polyp 12/25/2011    Tubular adenomatous    Surgical History:  Past Surgical History  Procedure Laterality Date  . Tubal ligation    . Breast lumpectomy Left     benign  . Colposcopy  2012    NEG    Family History:  Family History  Problem Relation Age of Onset  . Aneurysm Mother   . Heart disease Mother   . Hypertension Mother   . Stroke Mother   . Lupus Mother   . Diabetes Maternal Aunt   . Cancer Maternal Aunt   . Colon cancer Neg Hx     Social History:  History  Substance Use Topics  . Smoking status: Current Some Day Smoker -- 0.10 packs/day    Types: Cigarettes  . Smokeless tobacco: Never Used  . Alcohol Use: 0.0 oz/week    0 Standard drinks or equivalent per week     Comment: occasional glass of wine    Review of Systems: Review of Systems  Constitutional: Positive for malaise/fatigue. Negative for fever, chills and weight loss.  HENT: Negative for congestion, ear pain and sore throat.   Eyes: Negative.   Respiratory: Negative for cough, shortness of breath and wheezing.   Cardiovascular: Negative for chest pain, palpitations and leg swelling.  Gastrointestinal: Negative for heartburn, nausea, vomiting, diarrhea, constipation, blood in stool and melena.  Genitourinary: Negative.   Skin: Negative.   Neurological: Negative for dizziness, sensory change, loss of consciousness and headaches.  Psychiatric/Behavioral: Negative for depression. The patient has insomnia. The patient is not nervous/anxious.     Physical Exam: Estimated body mass index is 33.35 kg/(m^2) as calculated from the following:   Height as of this encounter: 5\' 7"   (1.702 m).   Weight as of this encounter: 213 lb (96.616 kg). BP 138/86 mmHg  Pulse 86  Temp(Src) 98 F (36.7 C) (Temporal)  Resp 18  Ht 5\' 7"  (1.702 m)  Wt 213 lb (96.616 kg)  BMI 33.35 kg/m2  LMP 08/13/2013  General Appearance: Well nourished well developed, in no apparent distress.  Eyes: PERRLA, EOMs, conjunctiva no swelling or erythema ENT/Mouth: Ear canals normal without obstruction, swelling, erythema, or discharge.  TMs normal bilaterally with no erythema, bulging, retraction, or loss of landmark.  Oropharynx moist and clear with no exudate, erythema, or swelling.   Neck: Supple, thyroid normal. No bruits.  No cervical adenopathy Respiratory: Respiratory effort normal, Breath sounds clear A&P without wheeze, rhonchi, rales.   Cardio: RRR without murmurs, rubs or gallops. Brisk peripheral pulses without edema.  Chest: symmetric,  with normal excursions Breasts: Large and pendulous, Symmetric, without lumps, nipple discharge, retractions. Well healed surgical scar on left breast. Abdomen: Soft, nontender, no guarding, rebound, hernias, masses, or organomegaly.  Lymphatics: Non tender without lymphadenopathy.  Genitourinary: deferred Musculoskeletal: Full ROM all peripheral extremities,5/5 strength, and normal gait.  Skin: Warm, dry without rashes, lesions, ecchymosis. Neuro: Awake and oriented X 3, Cranial nerves intact, reflexes equal bilaterally. Normal muscle tone, no cerebellar symptoms. Sensation intact.  Psych:  normal affect, Insight and Judgment appropriate.   EKG: WNL no changes.  AORTA SCAN: WNL   Over 40 minutes of exam, counseling, chart review and critical decision making was performed  Starlyn Skeans 9:27 AM [facility]

## 2015-06-04 LAB — URINALYSIS, ROUTINE W REFLEX MICROSCOPIC
Bilirubin Urine: NEGATIVE
Glucose, UA: NEGATIVE mg/dL
Hgb urine dipstick: NEGATIVE
KETONES UR: NEGATIVE mg/dL
Leukocytes, UA: NEGATIVE
NITRITE: NEGATIVE
Protein, ur: NEGATIVE mg/dL
SPECIFIC GRAVITY, URINE: 1.013 (ref 1.005–1.030)
UROBILINOGEN UA: 0.2 mg/dL (ref 0.0–1.0)
pH: 6.5 (ref 5.0–8.0)

## 2015-06-04 LAB — MICROALBUMIN / CREATININE URINE RATIO
Creatinine, Urine: 139.7 mg/dL
MICROALB/CREAT RATIO: 2.1 mg/g (ref 0.0–30.0)
Microalb, Ur: 0.3 mg/dL (ref <2.0)

## 2015-06-04 LAB — INSULIN, RANDOM: Insulin: 5.8 u[IU]/mL (ref 2.0–19.6)

## 2015-06-04 LAB — VITAMIN D 25 HYDROXY (VIT D DEFICIENCY, FRACTURES): Vit D, 25-Hydroxy: 24 ng/mL — ABNORMAL LOW (ref 30–100)

## 2015-06-10 ENCOUNTER — Encounter: Payer: Self-pay | Admitting: Physician Assistant

## 2015-06-11 ENCOUNTER — Ambulatory Visit: Payer: Self-pay | Admitting: Internal Medicine

## 2015-06-15 ENCOUNTER — Encounter: Payer: Self-pay | Admitting: Internal Medicine

## 2015-06-15 ENCOUNTER — Ambulatory Visit (INDEPENDENT_AMBULATORY_CARE_PROVIDER_SITE_OTHER): Payer: 59 | Admitting: Internal Medicine

## 2015-06-15 VITALS — BP 122/84 | HR 100 | Temp 98.2°F | Resp 18 | Ht 67.0 in | Wt 210.0 lb

## 2015-06-15 DIAGNOSIS — R1030 Lower abdominal pain, unspecified: Secondary | ICD-10-CM

## 2015-06-15 DIAGNOSIS — K644 Residual hemorrhoidal skin tags: Secondary | ICD-10-CM

## 2015-06-15 DIAGNOSIS — K648 Other hemorrhoids: Secondary | ICD-10-CM

## 2015-06-15 DIAGNOSIS — R300 Dysuria: Secondary | ICD-10-CM

## 2015-06-15 MED ORDER — HYDROCORTISONE ACETATE 25 MG RE SUPP: 25.0000 mg | Freq: Two times a day (BID) | RECTAL | Status: DC

## 2015-06-15 MED ORDER — ONDANSETRON HCL 4 MG PO TABS: 4.0000 mg | ORAL_TABLET | Freq: Every day | ORAL | Status: DC | PRN

## 2015-06-15 MED ORDER — CIPROFLOXACIN HCL 500 MG PO TABS: 500.0000 mg | ORAL_TABLET | Freq: Two times a day (BID) | ORAL | Status: AC

## 2015-06-15 NOTE — Patient Instructions (Signed)
Hemorrhoids Hemorrhoids are swollen veins around the rectum or anus. There are two types of hemorrhoids:   Internal hemorrhoids. These occur in the veins just inside the rectum. They may poke through to the outside and become irritated and painful.  External hemorrhoids. These occur in the veins outside the anus and can be felt as a painful swelling or hard lump near the anus. CAUSES  Pregnancy.   Obesity.   Constipation or diarrhea.   Straining to have a bowel movement.   Sitting for long periods on the toilet.  Heavy lifting or other activity that caused you to strain.  Anal intercourse. SYMPTOMS   Pain.   Anal itching or irritation.   Rectal bleeding.   Fecal leakage.   Anal swelling.   One or more lumps around the anus.  DIAGNOSIS  Your caregiver may be able to diagnose hemorrhoids by visual examination. Other examinations or tests that may be performed include:   Examination of the rectal area with a gloved hand (digital rectal exam).   Examination of anal canal using a small tube (scope).   A blood test if you have lost a significant amount of blood.  A test to look inside the colon (sigmoidoscopy or colonoscopy). TREATMENT Most hemorrhoids can be treated at home. However, if symptoms do not seem to be getting better or if you have a lot of rectal bleeding, your caregiver may perform a procedure to help make the hemorrhoids get smaller or remove them completely. Possible treatments include:   Placing a rubber band at the base of the hemorrhoid to cut off the circulation (rubber band ligation).   Injecting a chemical to shrink the hemorrhoid (sclerotherapy).   Using a tool to burn the hemorrhoid (infrared light therapy).   Surgically removing the hemorrhoid (hemorrhoidectomy).   Stapling the hemorrhoid to block blood flow to the tissue (hemorrhoid stapling).  HOME CARE INSTRUCTIONS   Eat foods with fiber, such as whole grains, beans,  nuts, fruits, and vegetables. Ask your doctor about taking products with added fiber in them (fibersupplements).  Increase fluid intake. Drink enough water and fluids to keep your urine clear or pale yellow.   Exercise regularly.   Go to the bathroom when you have the urge to have a bowel movement. Do not wait.   Avoid straining to have bowel movements.   Keep the anal area dry and clean. Use wet toilet paper or moist towelettes after a bowel movement.   Medicated creams and suppositories may be used or applied as directed.   Only take over-the-counter or prescription medicines as directed by your caregiver.   Take warm sitz baths for 15-20 minutes, 3-4 times a day to ease pain and discomfort.   Place ice packs on the hemorrhoids if they are tender and swollen. Using ice packs between sitz baths may be helpful.   Put ice in a plastic bag.   Place a towel between your skin and the bag.   Leave the ice on for 15-20 minutes, 3-4 times a day.   Do not use a donut-shaped pillow or sit on the toilet for long periods. This increases blood pooling and pain.  SEEK MEDICAL CARE IF:  You have increasing pain and swelling that is not controlled by treatment or medicine.  You have uncontrolled bleeding.  You have difficulty or you are unable to have a bowel movement.  You have pain or inflammation outside the area of the hemorrhoids. MAKE SURE YOU:  Understand these instructions.    Will watch your condition.  Will get help right away if you are not doing well or get worse. Document Released: 11/10/2000 Document Revised: 10/30/2012 Document Reviewed: 09/17/2012 [facility] Patient Information 2015 [place], [place]. This information is not intended to replace advice given to you by your health care provider. Make sure you discuss any questions you have with your health care provider.  Urinary Tract Infection Urinary tract infections (UTIs) can develop anywhere along your  urinary tract. Your urinary tract is your body's drainage system for removing wastes and extra water. Your urinary tract includes two kidneys, two ureters, a bladder, and a urethra. Your kidneys are a pair of bean-shaped organs. Each kidney is about the size of your fist. They are located below your ribs, one on each side of your spine. CAUSES Infections are caused by microbes, which are microscopic organisms, including fungi, viruses, and bacteria. These organisms are so small that they can only be seen through a microscope. Bacteria are the microbes that most commonly cause UTIs. SYMPTOMS  Symptoms of UTIs may vary by age and gender of the patient and by the location of the infection. Symptoms in young women typically include a frequent and intense urge to urinate and a painful, burning feeling in the bladder or urethra during urination. Older women and men are more likely to be tired, shaky, and weak and have muscle aches and abdominal pain. A fever may mean the infection is in your kidneys. Other symptoms of a kidney infection include pain in your back or sides below the ribs, nausea, and vomiting. DIAGNOSIS To diagnose a UTI, your caregiver will ask you about your symptoms. Your caregiver also will ask to provide a urine sample. The urine sample will be tested for bacteria and white blood cells. White blood cells are made by your body to help fight infection. TREATMENT  Typically, UTIs can be treated with medication. Because most UTIs are caused by a bacterial infection, they usually can be treated with the use of antibiotics. The choice of antibiotic and length of treatment depend on your symptoms and the type of bacteria causing your infection. HOME CARE INSTRUCTIONS  If you were prescribed antibiotics, take them exactly as your caregiver instructs you. Finish the medication even if you feel better after you have only taken some of the medication.  Drink enough water and fluids to keep your urine  clear or pale yellow.  Avoid caffeine, tea, and carbonated beverages. They tend to irritate your bladder.  Empty your bladder often. Avoid holding urine for long periods of time.  Empty your bladder before and after sexual intercourse.  After a bowel movement, women should cleanse from front to back. Use each tissue only once. SEEK MEDICAL CARE IF:   You have back pain.  You develop a fever.  Your symptoms do not begin to resolve within 3 days. SEEK IMMEDIATE MEDICAL CARE IF:   You have severe back pain or lower abdominal pain.  You develop chills.  You have nausea or vomiting.  You have continued burning or discomfort with urination. MAKE SURE YOU:   Understand these instructions.  Will watch your condition.  Will get help right away if you are not doing well or get worse. Document Released: 08/23/2005 Document Revised: 05/14/2012 Document Reviewed: 12/22/2011 [facility] Patient Information 2015 [place], [place]. This information is not intended to replace advice given to you by your health care provider. Make sure you discuss any questions you have with your health care provider.

## 2015-06-15 NOTE — Progress Notes (Signed)
Subjective:    Patient ID: SPRING SAN, female    DOB: 04-Feb-1971, 44 y.o.   MRN: [id number]  HPI  Patient presents to the office for evaluation of blood in her stool x 2 last week.  She reports that bright red blood in her stool.  She reports that blood was coating the stool.  She reports that it was also on the tissue when she wiped.  The water of the toilet was tinged pink.  She reports that she was having some constipation and had some straining.  She reports that the stool was hard to pass.  She reports that she had one other episode of blood in her stool shortly after the first.  She has had no rectal bleeding since that time.  She does report that she has never had hemorrhoids and never had an issue with blood in her stool.  She reports that she still has some mild lower abdominal pain.  She reports that the pain is slightly different from her normal abdominal pain.       Review of Systems  Constitutional: Negative for fever, chills and fatigue.  Respiratory: Negative for chest tightness and shortness of breath.   Cardiovascular: Negative for chest pain and palpitations.  Gastrointestinal: Positive for nausea, abdominal pain, constipation and blood in stool. Negative for vomiting.  Genitourinary: Positive for dysuria, urgency and frequency. Negative for hematuria.       Objective:   Physical Exam  Constitutional: She is oriented to person, place, and time. She appears well-developed and well-nourished. No distress.  HENT:  Head: Normocephalic.  Mouth/Throat: Oropharynx is clear and moist. No oropharyngeal exudate.  Eyes: Conjunctivae are normal. No scleral icterus.  Neck: Normal range of motion. Neck supple. No JVD present. No thyromegaly present.  Cardiovascular: Normal rate, regular rhythm, normal heart sounds and intact distal pulses.  Exam reveals no gallop and no friction rub.   No murmur heard. Pulmonary/Chest: Effort normal and breath sounds normal.  Abdominal:  Soft. Bowel sounds are normal. She exhibits no distension and no mass. There is tenderness in the suprapubic area. There is no rigidity, no rebound, no guarding, no CVA tenderness, no tenderness at McBurney's point and negative Murphy's sign.  Genitourinary:     Musculoskeletal: Normal range of motion.  Lymphadenopathy:    She has no cervical adenopathy.  Neurological: She is alert and oriented to person, place, and time.  Skin: Skin is warm and dry. She is not diaphoretic.  Psychiatric: She has a normal mood and affect. Her behavior is normal. Judgment and thought content normal.  Nursing note and vitals reviewed.   Filed Vitals:   06/15/15 1126  BP: 122/84  Pulse: 100  Temp: 98.2 F (36.8 C)  Resp: 18         Assessment & Plan:    1. External hemorrhoid  - hydrocortisone (ANUSOL-HC) 25 MG suppository; Place 1 suppository (25 mg total) rectally 2 (two) times daily. For 7 days  Dispense: 14 suppository; Refill: 0  2. Suprapubic abdominal pain, unspecified laterality -possible UTI given urinary symptoms vs. Diverticulitis vs. IBS -covering for all possibilities  3. Dysuria  - ondansetron (ZOFRAN) 4 MG tablet; Take 1 tablet (4 mg total) by mouth daily as needed for nausea or vomiting.  Dispense: 30 tablet; Refill: 1 - ciprofloxacin (CIPRO) 500 MG tablet; Take 1 tablet (500 mg total) by mouth 2 (two) times daily.  Dispense: 14 tablet; Refill: 0 - Urinalysis, Reflex Microscopic - Urine culture

## 2015-06-16 LAB — URINALYSIS, ROUTINE W REFLEX MICROSCOPIC
Bilirubin Urine: NEGATIVE
Glucose, UA: NEGATIVE mg/dL
Hgb urine dipstick: NEGATIVE
Ketones, ur: NEGATIVE mg/dL
LEUKOCYTES UA: NEGATIVE
NITRITE: NEGATIVE
PH: 6 (ref 5.0–8.0)
PROTEIN: NEGATIVE mg/dL
Specific Gravity, Urine: 1.018 (ref 1.005–1.030)
UROBILINOGEN UA: 0.2 mg/dL (ref 0.0–1.0)

## 2015-06-17 LAB — URINE CULTURE: Colony Count: 3000

## 2015-06-29 ENCOUNTER — Other Ambulatory Visit: Payer: Self-pay

## 2015-06-29 DIAGNOSIS — Z1231 Encounter for screening mammogram for malignant neoplasm of breast: Secondary | ICD-10-CM

## 2015-07-07 ENCOUNTER — Ambulatory Visit: Payer: Self-pay | Admitting: Internal Medicine

## 2015-08-05 ENCOUNTER — Ambulatory Visit
Admission: RE | Admit: 2015-08-05 | Discharge: 2015-08-05 | Disposition: A | Discharge status: Home / Self Care | Payer: 59 | Source: Ambulatory Visit

## 2015-08-05 DIAGNOSIS — Z1231 Encounter for screening mammogram for malignant neoplasm of breast: Secondary | ICD-10-CM

## 2015-08-09 ENCOUNTER — Ambulatory Visit (INDEPENDENT_AMBULATORY_CARE_PROVIDER_SITE_OTHER): Payer: 59 | Admitting: Internal Medicine

## 2015-08-09 ENCOUNTER — Encounter: Payer: Self-pay | Admitting: Internal Medicine

## 2015-08-09 VITALS — BP 134/86 | HR 78 | Temp 98.2°F | Resp 18 | Ht 67.0 in | Wt 217.0 lb

## 2015-08-09 DIAGNOSIS — K649 Unspecified hemorrhoids: Secondary | ICD-10-CM | POA: Diagnosis not present

## 2015-08-09 DIAGNOSIS — F411 Generalized anxiety disorder: Secondary | ICD-10-CM

## 2015-08-09 MED ORDER — ALPRAZOLAM 0.5 MG PO TABS: 0.5000 mg | ORAL_TABLET | Freq: Three times a day (TID) | ORAL | Status: AC | PRN

## 2015-08-09 NOTE — Progress Notes (Signed)
   Subjective:    Patient ID: Rebecca [name], female    DOB: Jul 27, 1971, 44 y.o.   MRN: [id number]  HPI  Patient presents to the office for 1 month recheck of rectal bleeding and also lower abdominal pain.  She reports that since that time she has been having some issues with upper respiratory infection.  She reports that she did go to the ENT and while she was there she was diagnosed with an E. Coli infection in her nose.  She has been taking Augmentin.  She has not had any issues with any diarrhea on it.  She reports that this diagnosis has been very stressful to her. She reports that her hemorrhoid has completely resolved itself.  She reports that she still has to strain to have some bowel movements.     Review of Systems  Constitutional: Negative for fever, chills and fatigue.  Respiratory: Negative for chest tightness and shortness of breath.   Cardiovascular: Negative for chest pain and palpitations.  Gastrointestinal: Negative for nausea, vomiting, abdominal pain, diarrhea, constipation and blood in stool.       Objective:   Physical Exam  Constitutional: She is oriented to person, place, and time. She appears well-developed and well-nourished. No distress.  HENT:  Head: Normocephalic.  Mouth/Throat: Oropharynx is clear and moist. No oropharyngeal exudate.  Eyes: Conjunctivae are normal. No scleral icterus.  Neck: Normal range of motion. Neck supple. No JVD present. No thyromegaly present.  Cardiovascular: Normal rate, regular rhythm, normal heart sounds and intact distal pulses.  Exam reveals no gallop and no friction rub.   No murmur heard. Pulmonary/Chest: Effort normal and breath sounds normal. No respiratory distress. She has no wheezes. She has no rales. She exhibits no tenderness.  Abdominal: Soft. Bowel sounds are normal. She exhibits no distension and no mass. There is no tenderness. There is no rebound and no guarding.  Musculoskeletal: Normal range of motion.   Lymphadenopathy:    She has no cervical adenopathy.  Neurological: She is alert and oriented to person, place, and time.  Skin: Skin is warm and dry. She is not diaphoretic.  Psychiatric: Her speech is normal. Judgment and thought content normal. Her mood appears anxious. Cognition and memory are normal.  Nursing note and vitals reviewed.   Filed Vitals:   08/09/15 1548  BP: 134/86  Pulse: 78  Temp: 98.2 F (36.8 C)  Resp: 18          Assessment & Plan:    1. Anxiety state -cont cymbalta - ALPRAZolam (XANAX) 0.5 MG tablet; Take 1 tablet (0.5 mg total) by mouth 3 (three) times daily as needed for sleep or anxiety.  Dispense: 60 tablet; Refill: 0  2. Hemorrhoids, unspecified hemorrhoid type -resolved

## 2015-09-01 ENCOUNTER — Encounter: Payer: Self-pay | Admitting: Physician Assistant

## 2015-09-01 ENCOUNTER — Other Ambulatory Visit (HOSPITAL_COMMUNITY)
Admission: RE | Admit: 2015-09-01 | Discharge: 2015-09-01 | Disposition: A | Discharge status: Home / Self Care | Payer: 59 | Source: Ambulatory Visit | Attending: Physician Assistant | Admitting: Physician Assistant

## 2015-09-01 ENCOUNTER — Ambulatory Visit (INDEPENDENT_AMBULATORY_CARE_PROVIDER_SITE_OTHER): Payer: 59 | Admitting: Physician Assistant

## 2015-09-01 VITALS — BP 126/66 | HR 86 | Temp 97.5°F | Resp 14 | Ht 67.0 in | Wt 215.0 lb

## 2015-09-01 DIAGNOSIS — R3915 Urgency of urination: Secondary | ICD-10-CM

## 2015-09-01 DIAGNOSIS — N760 Acute vaginitis: Secondary | ICD-10-CM | POA: Insufficient documentation

## 2015-09-01 DIAGNOSIS — R102 Pelvic and perineal pain: Secondary | ICD-10-CM | POA: Diagnosis not present

## 2015-09-01 DIAGNOSIS — M545 Low back pain, unspecified: Secondary | ICD-10-CM

## 2015-09-01 DIAGNOSIS — N898 Other specified noninflammatory disorders of vagina: Secondary | ICD-10-CM

## 2015-09-01 DIAGNOSIS — Z01419 Encounter for gynecological examination (general) (routine) without abnormal findings: Secondary | ICD-10-CM | POA: Insufficient documentation

## 2015-09-01 DIAGNOSIS — Z1151 Encounter for screening for human papillomavirus (HPV): Secondary | ICD-10-CM | POA: Diagnosis present

## 2015-09-01 DIAGNOSIS — Z124 Encounter for screening for malignant neoplasm of cervix: Secondary | ICD-10-CM | POA: Diagnosis not present

## 2015-09-01 MED ORDER — METRONIDAZOLE 0.75 % VA GEL: 1.0000 | Freq: Every day | VAGINAL | Status: DC

## 2015-09-01 MED ORDER — MELOXICAM 15 MG PO TABS: ORAL_TABLET | ORAL | Status: DC

## 2015-09-01 NOTE — Patient Instructions (Signed)
Bacterial Vaginosis Bacterial vaginosis is an infection of the vagina. It happens when too many germs (bacteria) grow in the vagina. Having this infection puts you at risk for getting other infections from sex. Treating this infection can help lower your risk for other infections, such as:   Chlamydia.  Gonorrhea.  HIV.  Herpes. HOME CARE  Take your medicine as told by your doctor.  Finish your medicine even if you start to feel better.  Tell your sex partner that you have an infection. They should see their doctor for treatment.  During treatment:  Avoid sex or use condoms correctly.  Do not douche.  Do not drink alcohol unless your doctor tells you it is ok.  Do not breastfeed unless your doctor tells you it is ok. GET HELP IF:  You are not getting better after 3 days of treatment.  You have more grey fluid (discharge) coming from your vagina than before.  You have more pain than before.  You have a fever. MAKE SURE YOU:   Understand these instructions.  Will watch your condition.  Will get help right away if you are not doing well or get worse.   This information is not intended to replace advice given to you by your health care provider. Make sure you discuss any questions you have with your health care provider.   Document Released: 08/22/2008 Document Revised: 12/04/2014 Document Reviewed: 06/25/2013 Elsevier Interactive Patient Education 2016 Elsevier Inc[employer] Back Pain, Adult Back pain is very common in adults.The cause of back pain is rarely dangerous and the pain often gets better over time.The cause of your back pain may not be known. Some common causes of back pain include:  Strain of the muscles or ligaments supporting the spine.  Wear and tear (degeneration) of the spinal disks.  Arthritis.  Direct injury to the back. For many people, back pain may return. Since back pain is rarely dangerous, most people can learn to manage this condition on  their own. HOME CARE INSTRUCTIONS Watch your back pain for any changes. The following actions may help to lessen any discomfort you are feeling:  Remain active. It is stressful on your back to sit or stand in one place for long periods of time. Do not sit, drive, or stand in one place for more than 30 minutes at a time. Take short walks on even surfaces as soon as you are able.Try to increase the length of time you walk each day.  Exercise regularly as directed by your health care provider. Exercise helps your back heal faster. It also helps avoid future injury by keeping your muscles strong and flexible.  Do not stay in bed.Resting more than 1-2 days can delay your recovery.  Pay attention to your body when you bend and lift. The most comfortable positions are those that put less stress on your recovering back. Always use proper lifting techniques, including:  Bending your knees.  Keeping the load close to your body.  Avoiding twisting.  Find a comfortable position to sleep. Use a firm mattress and lie on your side with your knees slightly bent. If you lie on your back, put a pillow under your knees.  Avoid feeling anxious or stressed.Stress increases muscle tension and can worsen back pain.It is important to recognize when you are anxious or stressed and learn ways to manage it, such as with exercise.  Take medicines only as directed by your health care provider. Over-the-counter medicines to reduce pain and inflammation are  often the most helpful.Your health care provider may prescribe muscle relaxant drugs.These medicines help dull your pain so you can more quickly return to your normal activities and healthy exercise.  Apply ice to the injured area:  Put ice in a plastic bag.  Place a towel between your skin and the bag.  Leave the ice on for 20 minutes, 2-3 times a day for the first 2-3 days. After that, ice and heat may be alternated to reduce pain and spasms.  Maintain a  healthy weight. Excess weight puts extra stress on your back and makes it difficult to maintain good posture. SEEK MEDICAL CARE IF:  You have pain that is not relieved with rest or medicine.  You have increasing pain going down into the legs or buttocks.  You have pain that does not improve in one week.  You have night pain.  You lose weight.  You have a fever or chills. SEEK IMMEDIATE MEDICAL CARE IF:   You develop new bowel or bladder control problems.  You have unusual weakness or numbness in your arms or legs.  You develop nausea or vomiting.  You develop abdominal pain.  You feel faint.   This information is not intended to replace advice given to you by your health care provider. Make sure you discuss any questions you have with your health care provider.   Document Released: 11/13/2005 Document Revised: 12/04/2014 Document Reviewed: 03/17/2014 Elsevier Interactive Patient Education [employer].

## 2015-09-01 NOTE — Progress Notes (Signed)
Subjective:    Patient ID: Rebecca [name], female    DOB: March 11, 1971, 44 y.o.   MRN: [id number]  HPI 44 y.o. AAF presents with symptoms x 2 weeks. She has mid lower back pain, dull ache, constant,  that will radiate up, not worse with movement. Has urinary urgency, no dysuria, no frequency. She is having a discharge, white, no itching. Has had sore nipples x 2 weeks. She states she has increased water, cranberry, took left over ABX, that did not help, has tried flexeril without help. Denies fever, chills.   Normal MGM 07/2015 Normal PAP 2012, Dr. Garwin Brothers.  + sexually active, declines STD testing.  AB [place] 01/2014, kidney stone, gallstones Pelvic US 2013, hemorraghic cyst right ovary, never had follow up  Blood pressure 126/66, pulse 86, temperature 97.5 F (36.4 C), temperature source Temporal, resp. rate 14, height 5\' 7"  (1.702 m), weight 215 lb (97.523 kg), last menstrual period 08/13/2013, SpO2 98 %.  Current Outpatient Prescriptions on File Prior to Visit  Medication Sig Dispense Refill  . ALPRAZolam (XANAX) 0.5 MG tablet Take 1 tablet (0.5 mg total) by mouth 3 (three) times daily as needed for sleep or anxiety. 60 tablet 0  . DULoxetine (CYMBALTA) 60 MG capsule Take 1 capsule (60 mg total) by mouth daily. 90 capsule 1   No current facility-administered medications on file prior to visit.   Past Medical History  Diagnosis Date  . 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 PUD (peptic ulcer disease)   . Hiatal hernia   . Duodenitis   . Esophagitis   . Vitamin D deficiency   . Fatigue   . Sacroiliac pain   . Depression   . Anemia   . GERD (gastroesophageal reflux disease)   . Obesity   . Gallstones   . Colon polyp 12/25/2011    Tubular adenomatous     Review of Systems  Constitutional: Negative.  Negative for fever and chills.  HENT: Negative.   Respiratory: Negative.   Cardiovascular: Negative.   Gastrointestinal: Negative for nausea, vomiting, abdominal pain,  diarrhea and constipation.  Genitourinary: Positive for urgency, vaginal discharge and pelvic pain. Negative for dysuria, frequency, hematuria, flank pain, decreased urine volume, vaginal bleeding, enuresis, difficulty urinating, genital sores, vaginal pain, menstrual problem and dyspareunia.  Musculoskeletal: Positive for back pain. Negative for myalgias, joint swelling, arthralgias, gait problem, neck pain and neck stiffness.  Skin: Negative for rash.       Objective:   Physical Exam  Constitutional: She is oriented to person, place, and time. She appears well-developed and well-nourished. No distress.  Obese  HENT:  Head: Normocephalic and atraumatic.  Eyes: Conjunctivae are normal. Pupils are equal, round, and reactive to light.  Neck: Normal range of motion. Neck supple.  Cardiovascular: Normal rate and regular rhythm.   Pulmonary/Chest: Effort normal and breath sounds normal.  Abdominal: Soft. Bowel sounds are normal. There is tenderness (suprapubic tenderness).  Genitourinary: Cervix exhibits discharge. Cervix exhibits no motion tenderness and no friability. Right adnexum displays tenderness and fullness. Right adnexum displays no mass. There is erythema in the vagina. No foreign body around the vagina. Vaginal discharge found.  VAGINA: PELVIC FLOOR EXAM: cystocele, vaginal discharge - clear, copious, malodorous and milky- WET PREP SENT OFF, PAP SMEAR DONE.  Musculoskeletal:  Patient is able to ambulate well. Gait is not  Antalgic. Straight leg raising with dorsiflexion negative bilaterally for radicular symptoms. Sensory exam in the legs are normal. Knee reflexes are normal  Ankle reflexes are normal Strength is normal and symmetric in arms and legs. There is not SI tenderness to palpation.  There isparaspinal muscle spasm.  There is not midline tenderness.  ROM of spine with  limited in all spheres due to pain. + CVA tenderness bilateral.   Lymphadenopathy:    She has no cervical  adenopathy.  Neurological: She is alert and oriented to person, place, and time. She has normal reflexes.  Skin: Skin is warm and dry. No rash noted.        Assessment & Plan:  1. Vaginal discharge Very fishy odor, will treat with metrogel, pending results.  - WET PREP BY MOLECULAR PROBE - metroNIDAZOLE (METROGEL) 0.75 % vaginal gel; Place 1 Applicatorful vaginally at bedtime. 1 applicator at night for 5 days  Dispense: 70 g; Refill: 0  2. Bilateral low back pain without sciatica Very mechanical, mobic, heat, flexeril, rest.  - meloxicam (MOBIC) 15 MG tablet; Take one daily with food for 2 weeks, can take with tylenol, can not take with aleve, iburpofen, then as needed daily for pain  Dispense: 30 tablet; Refill: 1  3. Screening for cervical cancer Over due - Cytology - PAP  4. Pelvic pain in female Has history of right sided hemorrhagic cyst, with flank pain will repeat US, rule out cyst/mass.  - US Transvaginal Non-OB; Future - US Pelvis Complete; Future  5. Urinary urgency Rule out UTI, but likely BV/yeast - Urinalysis, Routine w reflex microscopic (not at [facility]) - Urine culture

## 2015-09-02 LAB — URINALYSIS, ROUTINE W REFLEX MICROSCOPIC
BILIRUBIN URINE: NEGATIVE
GLUCOSE, UA: NEGATIVE
Hgb urine dipstick: NEGATIVE
KETONES UR: NEGATIVE
Leukocytes, UA: NEGATIVE
Nitrite: NEGATIVE
PROTEIN: NEGATIVE
SPECIFIC GRAVITY, URINE: 1.017 (ref 1.001–1.035)
pH: 7 (ref 5.0–8.0)

## 2015-09-03 LAB — URINE CULTURE

## 2015-09-03 LAB — CERVICOVAGINAL ANCILLARY ONLY: Wet Prep (BD Affirm): NEGATIVE

## 2015-09-03 LAB — CYTOLOGY - PAP

## 2015-09-15 ENCOUNTER — Ambulatory Visit (HOSPITAL_COMMUNITY)
Admission: RE | Admit: 2015-09-15 | Discharge: 2015-09-15 | Disposition: A | Discharge status: Home / Self Care | Payer: 59 | Source: Ambulatory Visit | Attending: Physician Assistant | Admitting: Physician Assistant

## 2015-09-15 DIAGNOSIS — Z780 Asymptomatic menopausal state: Secondary | ICD-10-CM | POA: Insufficient documentation

## 2015-09-15 DIAGNOSIS — R102 Pelvic and perineal pain: Secondary | ICD-10-CM | POA: Insufficient documentation

## 2015-09-23 ENCOUNTER — Inpatient Hospital Stay (HOSPITAL_COMMUNITY)
Admission: AD | Admit: 2015-09-23 | Discharge: 2015-09-23 | Disposition: A | Discharge status: Home / Self Care | Payer: 59 | Source: Ambulatory Visit | Attending: Family Medicine | Admitting: Family Medicine

## 2015-09-23 ENCOUNTER — Encounter (HOSPITAL_COMMUNITY): Payer: Self-pay | Admitting: *Deleted

## 2015-09-23 DIAGNOSIS — R55 Syncope and collapse: Secondary | ICD-10-CM | POA: Diagnosis not present

## 2015-09-23 DIAGNOSIS — F17210 Nicotine dependence, cigarettes, uncomplicated: Secondary | ICD-10-CM | POA: Insufficient documentation

## 2015-09-23 DIAGNOSIS — R7303 Prediabetes: Secondary | ICD-10-CM | POA: Insufficient documentation

## 2015-09-23 DIAGNOSIS — I10 Essential (primary) hypertension: Secondary | ICD-10-CM | POA: Insufficient documentation

## 2015-09-23 DIAGNOSIS — R42 Dizziness and giddiness: Secondary | ICD-10-CM | POA: Diagnosis present

## 2015-09-23 HISTORY — DX: Unspecified infectious disease: B99.9

## 2015-09-23 HISTORY — DX: Unspecified abnormal cytological findings in specimens from vagina: R87.629

## 2015-09-23 LAB — CBC
HCT: 36.9 % (ref 36.0–46.0)
Hemoglobin: 12 g/dL (ref 12.0–15.0)
MCH: 29.3 pg (ref 26.0–34.0)
MCHC: 32.5 g/dL (ref 30.0–36.0)
MCV: 90 fL (ref 78.0–100.0)
PLATELETS: 231 10*3/uL (ref 150–400)
RBC: 4.1 MIL/uL (ref 3.87–5.11)
RDW: 13.5 % (ref 11.5–15.5)
WBC: 4.9 10*3/uL (ref 4.0–10.5)

## 2015-09-23 LAB — COMPREHENSIVE METABOLIC PANEL
ALBUMIN: 3.9 g/dL (ref 3.5–5.0)
ALT: 23 U/L (ref 14–54)
AST: 20 U/L (ref 15–41)
Alkaline Phosphatase: 77 U/L (ref 38–126)
Anion gap: 3 — ABNORMAL LOW (ref 5–15)
BUN: 15 mg/dL (ref 6–20)
CHLORIDE: 105 mmol/L (ref 101–111)
CO2: 30 mmol/L (ref 22–32)
Calcium: 9.5 mg/dL (ref 8.9–10.3)
Creatinine, Ser: 0.71 mg/dL (ref 0.44–1.00)
GFR calc Af Amer: >60 mL/min (ref >60)
Glucose, Bld: 100 mg/dL — ABNORMAL HIGH (ref 65–99)
POTASSIUM: 4.4 mmol/L (ref 3.5–5.1)
SODIUM: 138 mmol/L (ref 135–145)
Total Bilirubin: 0.4 mg/dL (ref 0.3–1.2)
Total Protein: 6.9 g/dL (ref 6.5–8.1)

## 2015-09-23 LAB — URINALYSIS, ROUTINE W REFLEX MICROSCOPIC
Bilirubin Urine: NEGATIVE
GLUCOSE, UA: NEGATIVE mg/dL
Hgb urine dipstick: NEGATIVE
Ketones, ur: NEGATIVE mg/dL
LEUKOCYTES UA: NEGATIVE
Nitrite: NEGATIVE
PROTEIN: NEGATIVE mg/dL
SPECIFIC GRAVITY, URINE: 1.01 (ref 1.005–1.030)
Urobilinogen, UA: 0.2 mg/dL (ref 0.0–1.0)
pH: 7 (ref 5.0–8.0)

## 2015-09-23 MED ORDER — KETOROLAC TROMETHAMINE 60 MG/2ML IM SOLN
60.0000 mg | Freq: Once | INTRAMUSCULAR | Status: AC
  Administered 2015-09-23: 60 mg via INTRAMUSCULAR
  Filled 2015-09-23: qty 2

## 2015-09-23 NOTE — Discharge Instructions (Signed)

## 2015-09-23 NOTE — MAU Provider Note (Signed)
History     CSN: [id number]  Arrival date and time: 09/23/15 1611   First Provider Initiated Contact with Patient 09/23/15 1651      Chief Complaint  Patient presents with  . 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  Pt denies nausea or vomiting.  Pt last ate carrots, kale and small amount of roast pork at 12:30pm. Sx onset about 3:30pm.  Pt has never felt this way before.  Pt denies visual changes or weakness.  Pt sees Dr. Melford Aase and has been told that she is "prediabetic". Mother died with cerebral aneurysm.  Pt has a mild headache RN note:      Expand All Collapse All   Was dizzy earlier this morning, then again now. Felt a little nauseated. Denies any pain, feels like HA is trying to start. No hx of BP problems.      Past Medical History  Diagnosis Date  . 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 PUD (peptic ulcer disease)   . Hiatal hernia   . Duodenitis   . Esophagitis   . Vitamin D deficiency   . Fatigue   . Sacroiliac pain   . Anemia   . GERD (gastroesophageal reflux disease)   . Obesity   . Gallstones   . Colon polyp 12/25/2011    Tubular adenomatous  . Infection     UTI  . Vaginal Pap smear, abnormal     colpo, results ok  . Depression     following death of mother    Past Surgical History  Procedure Laterality Date  . Tubal ligation    . Breast lumpectomy Left     benign  . Colposcopy  2012    NEG    Family History  Problem Relation Age of Onset  . Aneurysm Mother   . Heart disease Mother   . Hypertension Mother   . Stroke Mother   . Lupus Mother   . Diabetes Maternal Aunt   . Cancer Maternal Aunt   . Colon cancer Neg Hx     Social History  Substance Use Topics  . Smoking status: Current Some Day Smoker -- 0.10 packs/day    Types: Cigarettes  . Smokeless tobacco: Never Used  . Alcohol Use: 0.0 oz/week    0  Standard drinks or equivalent per week     Comment: occasional glass of wine    Allergies: No Known Allergies  Prescriptions prior to admission  Medication Sig Dispense Refill Last Dose  . ALPRAZolam (XANAX) 0.5 MG tablet Take 1 tablet (0.5 mg total) by mouth 3 (three) times daily as needed for sleep or anxiety. 60 tablet 0 Past Month at Unknown time  . BIOTIN PO Take 1 capsule by mouth daily.   09/23/2015 at Unknown time  . cholecalciferol (VITAMIN D) 1000 UNITS tablet Take 5,000 Units by mouth daily.   Past Week at Unknown time  . DULoxetine (CYMBALTA) 60 MG capsule Take 1 capsule (60 mg total) by mouth daily. 90 capsule 1 09/23/2015 at Unknown time  . ferrous sulfate 325 (65 FE) MG tablet Take 325 mg by mouth daily with breakfast.   09/23/2015 at Unknown time  . Multiple Vitamin (MULTIVITAMIN WITH MINERALS) TABS tablet Take 1 tablet by mouth daily.   09/23/2015 at Unknown time  . Probiotic Product (PROBIOTIC  PO) Take 1 capsule by mouth daily.   09/23/2015 at Unknown time  . meloxicam (MOBIC) 15 MG tablet Take one daily with food for 2 weeks, can take with tylenol, can not take with aleve, iburpofen, then as needed daily for pain (Patient not taking: Reported on 09/23/2015) 30 tablet 1 Not Taking at Unknown time  . metroNIDAZOLE (METROGEL) 0.75 % vaginal gel Place 1 Applicatorful vaginally at bedtime. 1 applicator at night for 5 days (Patient not taking: Reported on 09/23/2015) 70 g 0 Completed Course at Unknown time    Review of Systems  Constitutional: Negative for fever and chills.  HENT:       Mild-mod frontal headache  Eyes: Negative for blurred vision and double vision.  Respiratory: Negative for cough.   Cardiovascular: Negative for chest pain.  Gastrointestinal: Positive for nausea. Negative for vomiting, abdominal pain, diarrhea and constipation.  Genitourinary: Negative for dysuria and urgency.  Neurological: Positive for dizziness and headaches.   Physical Exam   Blood  pressure 163/84, pulse 82, temperature 98.1 F (36.7 C), temperature source Oral, resp. rate 20, last menstrual period 08/13/2013, SpO2 100 %.  Physical Exam  Nursing note and vitals reviewed. Constitutional: She is oriented to person, place, and time. She appears well-developed and well-nourished. No distress.  HENT:  Head: Normocephalic.  Eyes: Pupils are equal, round, and reactive to light.  Neck: Normal range of motion. Neck supple.  Cardiovascular: Normal rate and regular rhythm.   Respiratory: Effort normal and breath sounds normal. No respiratory distress. She has no wheezes. She has no rales.  GI: Soft.  Musculoskeletal: Normal range of motion.  Neurological: She is alert and oriented to person, place, and time.  Skin: Skin is warm and dry.  Psychiatric: She has a normal mood and affect.    MAU Course  Procedures EKG- normal sinus rhythm, normal EKG Results for orders placed or performed during the hospital encounter of 09/23/15 (from the past 24 hour(s))  CBC     Status: None   Collection Time: 09/23/15  4:59 PM  Result Value Ref Range   WBC 4.9 4.0 - 10.5 K/uL   RBC 4.10 3.87 - 5.11 MIL/uL   Hemoglobin 12.0 12.0 - 15.0 g/dL   HCT 36.9 36.0 - 46.0 %   MCV 90.0 78.0 - 100.0 fL   MCH 29.3 26.0 - 34.0 pg   MCHC 32.5 30.0 - 36.0 g/dL   RDW 13.5 11.5 - 15.5 %   Platelets 231 150 - 400 K/uL  Comprehensive metabolic panel     Status: Abnormal   Collection Time: 09/23/15  4:59 PM  Result Value Ref Range   Sodium 138 135 - 145 mmol/L   Potassium 4.4 3.5 - 5.1 mmol/L   Chloride 105 101 - 111 mmol/L   CO2 30 22 - 32 mmol/L   Glucose, Bld 100 (H) 65 - 99 mg/dL   BUN 15 6 - 20 mg/dL   Creatinine, Ser 0.71 0.44 - 1.00 mg/dL   Calcium 9.5 8.9 - 10.3 mg/dL   Total Protein 6.9 6.5 - 8.1 g/dL   Albumin 3.9 3.5 - 5.0 g/dL   AST 20 15 - 41 U/L   ALT 23 14 - 54 U/L   Alkaline Phosphatase 77 38 - 126 U/L   Total Bilirubin 0.4 0.3 - 1.2 mg/dL   GFR calc non Af Amer >60 >60  mL/min   GFR calc Af Amer >60 >60 mL/min   Anion gap 3 (L) 5 - 15  Urinalysis, Routine w reflex microscopic (not at [facility])     Status: None   Collection Time: 11-Oct-2015  6:05 PM  Result Value Ref Range   Color, Urine YELLOW YELLOW   APPearance CLEAR CLEAR   Specific Gravity, Urine 1.010 1.005 - 1.030   pH 7.0 5.0 - 8.0   Glucose, UA NEGATIVE NEGATIVE mg/dL   Hgb urine dipstick NEGATIVE NEGATIVE   Bilirubin Urine NEGATIVE NEGATIVE   Ketones, ur NEGATIVE NEGATIVE mg/dL   Protein, ur NEGATIVE NEGATIVE mg/dL   Urobilinogen, UA 0.2 0.0 - 1.0 mg/dL   Nitrite NEGATIVE NEGATIVE   Leukocytes, UA NEGATIVE NEGATIVE   Toradol 60mg  Im given for headache  Assessment and Plan  Near syncope- discussed need for close follow up Hx essential hypertension and glucose intolerance Mother- died with CVA Serita Degroote 11-Oct-2015, 4:52 PM

## 2015-09-23 NOTE — MAU Note (Signed)
Was dizzy earlier this morning, then again now.  Felt a little nauseated.  Denies any pain, feels like HA is trying to start. No hx of BP problems.

## 2015-12-11 DIAGNOSIS — H5213 Myopia, bilateral: Secondary | ICD-10-CM | POA: Diagnosis not present

## 2015-12-21 ENCOUNTER — Encounter: Payer: Self-pay | Admitting: Physician Assistant

## 2015-12-21 ENCOUNTER — Ambulatory Visit (INDEPENDENT_AMBULATORY_CARE_PROVIDER_SITE_OTHER): Payer: 59 | Admitting: Physician Assistant

## 2015-12-21 VITALS — BP 132/80 | HR 85 | Temp 97.2°F | Resp 16 | Ht 67.0 in | Wt 213.0 lb

## 2015-12-21 DIAGNOSIS — N3000 Acute cystitis without hematuria: Secondary | ICD-10-CM

## 2015-12-21 DIAGNOSIS — R109 Unspecified abdominal pain: Secondary | ICD-10-CM | POA: Diagnosis not present

## 2015-12-21 LAB — BASIC METABOLIC PANEL WITH GFR
BUN: 15 mg/dL (ref 7–25)
CALCIUM: 9.9 mg/dL (ref 8.6–10.2)
CHLORIDE: 103 mmol/L (ref 98–110)
CO2: 35 mmol/L — ABNORMAL HIGH (ref 20–31)
CREATININE: 0.83 mg/dL (ref 0.50–1.10)
GFR, Est Non African American: 85 mL/min (ref >=60)
Glucose, Bld: 87 mg/dL (ref 65–99)
Potassium: 4.5 mmol/L (ref 3.5–5.3)
SODIUM: 139 mmol/L (ref 135–146)

## 2015-12-21 LAB — CBC WITH DIFFERENTIAL/PLATELET
BASOS PCT: 0 % (ref 0–1)
Basophils Absolute: 0 10*3/uL (ref 0.0–0.1)
EOS ABS: 0.2 10*3/uL (ref 0.0–0.7)
Eosinophils Relative: 3 % (ref 0–5)
HCT: 40.6 % (ref 36.0–46.0)
Hemoglobin: 13.3 g/dL (ref 12.0–15.0)
Lymphocytes Relative: 55 % — ABNORMAL HIGH (ref 12–46)
Lymphs Abs: 3.1 10*3/uL (ref 0.7–4.0)
MCH: 29.3 pg (ref 26.0–34.0)
MCHC: 32.8 g/dL (ref 30.0–36.0)
MCV: 89.4 fL (ref 78.0–100.0)
MONOS PCT: 9 % (ref 3–12)
MPV: 9.7 fL (ref 8.6–12.4)
Monocytes Absolute: 0.5 10*3/uL (ref 0.1–1.0)
NEUTROS PCT: 33 % — AB (ref 43–77)
Neutro Abs: 1.8 10*3/uL (ref 1.7–7.7)
PLATELETS: 259 10*3/uL (ref 150–400)
RBC: 4.54 MIL/uL (ref 3.87–5.11)
RDW: 13.1 % (ref 11.5–15.5)
WBC: 5.6 10*3/uL (ref 4.0–10.5)

## 2015-12-21 MED ORDER — SULFAMETHOXAZOLE-TRIMETHOPRIM 800-160 MG PO TABS: 1.0000 | ORAL_TABLET | Freq: Two times a day (BID) | ORAL | Status: DC

## 2015-12-21 MED ORDER — FLUCONAZOLE 150 MG PO TABS: 150.0000 mg | ORAL_TABLET | Freq: Once | ORAL | Status: DC

## 2015-12-21 MED ORDER — DULOXETINE HCL 60 MG PO CPEP: 60.0000 mg | ORAL_CAPSULE | Freq: Every day | ORAL | Status: DC

## 2015-12-21 MED FILL — DULoxetine HCL 60 MG CPEP: 60 | 90 days supply | Qty: 90 | Fill #0

## 2015-12-21 MED FILL — SULFAMETHOXAZOLE/TMP DS TAB: 800-160 | 10 days supply | Qty: 20 | Fill #0

## 2015-12-21 MED FILL — FLUCONAZOLE 150 MG TABLET: 150 | 1 days supply | Qty: 1 | Fill #0

## 2015-12-21 NOTE — Progress Notes (Signed)
HPI: complains of UTI symptoms Onset 4 days ago, progressively worse associated with dysuria and small volume voiding with increased frequency Some left sided flank pain and suprapubic pain denies hematuria,  or fever The patient has a history of prior UTI  PMH: reviewed  ROS:  Gen.: No unexpected weight change, no night sweats Lungs: No cough or shortness of breath Cardiovascular: No palpitations or chest pain  PE: BP 132/80 mmHg  Pulse 85  Temp(Src) 97.2 F (36.2 C) (Temporal)  Resp 16  Ht 5\' 7"  (1.702 m)  Wt 213 lb (96.616 kg)  BMI 33.35 kg/m2  SpO2 97%  LMP 08/13/2013 General: No acute distress Lungs: Clear to auscultation Cardiovascular: Regular rate rhythm, no edema Abdomen: Mild to moderate discomfort of her suprapubic region, + flank tenderness left side  Lab Results  Component Value Date   WBC 4.9 09/23/2015   HGB 12.0 09/23/2015   HCT 36.9 09/23/2015   PLT 231 09/23/2015   GLUCOSE 100* 09/23/2015   CHOL 179 06/03/2015   TRIG 152* 06/03/2015   HDL 45* 06/03/2015   LDLCALC 104* 06/03/2015   ALT 23 09/23/2015   AST 20 09/23/2015   NA 138 09/23/2015   K 4.4 09/23/2015   CL 105 09/23/2015   CREATININE 0.71 09/23/2015   BUN 15 09/23/2015   CO2 30 09/23/2015   TSH 1.383 06/03/2015   HGBA1C 6.1* 06/03/2015   MICROALBUR 0.3 06/03/2015    Assessment/Plan: UTI, classic symptoms with history of same rule out pyleonephritis  Empiric antibiotic x7 days Urine culture for identification and sensitivities Hydration recommended education provided

## 2015-12-21 NOTE — Patient Instructions (Signed)

## 2015-12-22 LAB — URINALYSIS, ROUTINE W REFLEX MICROSCOPIC
Bilirubin Urine: NEGATIVE
Glucose, UA: NEGATIVE
HGB URINE DIPSTICK: NEGATIVE
KETONES UR: NEGATIVE
Leukocytes, UA: NEGATIVE
NITRITE: NEGATIVE
PROTEIN: NEGATIVE
Specific Gravity, Urine: 1.01 (ref 1.001–1.035)
pH: 6 (ref 5.0–8.0)

## 2015-12-23 LAB — URINE CULTURE
COLONY COUNT: NO GROWTH
Organism ID, Bacteria: NO GROWTH

## 2016-05-04 ENCOUNTER — Other Ambulatory Visit: Payer: Self-pay | Admitting: Physician Assistant

## 2016-05-04 MED FILL — DULoxetine HCL 60 MG CPEP: 60 | 90 days supply | Qty: 90 | Fill #0

## 2016-05-11 ENCOUNTER — Encounter: Payer: Self-pay | Admitting: Internal Medicine

## 2016-05-11 ENCOUNTER — Encounter: Payer: Self-pay | Admitting: Physician Assistant

## 2016-05-11 ENCOUNTER — Encounter (INDEPENDENT_AMBULATORY_CARE_PROVIDER_SITE_OTHER): Payer: Self-pay

## 2016-05-11 ENCOUNTER — Ambulatory Visit (INDEPENDENT_AMBULATORY_CARE_PROVIDER_SITE_OTHER): Payer: 59 | Admitting: Internal Medicine

## 2016-05-11 VITALS — BP 128/82 | HR 76 | Temp 98.0°F | Resp 18 | Ht 67.0 in | Wt 223.0 lb

## 2016-05-11 DIAGNOSIS — M62831 Muscle spasm of calf: Secondary | ICD-10-CM

## 2016-05-11 DIAGNOSIS — G4700 Insomnia, unspecified: Secondary | ICD-10-CM | POA: Diagnosis not present

## 2016-05-11 MED ORDER — TRAZODONE HCL 50 MG PO TABS: 50.0000 mg | ORAL_TABLET | Freq: Every day | ORAL | Status: DC

## 2016-05-11 MED ORDER — BACLOFEN 10 MG PO TABS: 10.0000 mg | ORAL_TABLET | Freq: Every day | ORAL | Status: DC

## 2016-05-11 MED ORDER — MELOXICAM 15 MG PO TABS: 15.0000 mg | ORAL_TABLET | Freq: Every day | ORAL | Status: DC

## 2016-05-11 MED FILL — MELOXICAM 15 MG TABLET: 15 | 90 days supply | Qty: 90 | Fill #0

## 2016-05-11 MED FILL — BACLOFEN 10 MG TABLET: 10 | 30 days supply | Qty: 30 | Fill #0

## 2016-05-11 MED FILL — traZODone HCL 50 MG TABS: 50 | 90 days supply | Qty: 90 | Fill #0

## 2016-05-11 NOTE — Progress Notes (Signed)
Subjective:    Patient ID: Rebecca [name], female    DOB: Feb 04, 1971, 45 y.o.   MRN: [id number]  Extremity Weakness  Pertinent negatives include no fever.  Extremity Pain  Pertinent negatives include no fever.   Patient presents to the office for evaluation of right leg pain and cramping.  She notes that she is having a lot of pain in the right leg for the last week.  She notes that the pain in her leg has woken her up.  She has not had any swelling in her leg.  She has been wearing her ted hose.  She notes that the cramping does not get a lot better with movement.  She does drink a lot of water.  She notes that she has not taken a lot of flexeril because she ran out.  1 tablet didn't help much.  She noticed that her bowels are looser.  She is concerned that this can be related to the blood clot that she had a long time ago when she was pregnant.  She notes that she is weak, tired, and can barely get up.    Review of Systems  Constitutional: Positive for fatigue. Negative for fever and chills.  HENT: Negative for congestion, ear pain, postnasal drip, sinus pressure, sore throat and trouble swallowing.   Respiratory: Negative for chest tightness and shortness of breath.   Cardiovascular: Negative for chest pain, palpitations and leg swelling.  Gastrointestinal: Negative for nausea, vomiting, diarrhea, constipation, blood in stool, abdominal distention and rectal pain.  Genitourinary: Negative.   Musculoskeletal: Positive for myalgias and extremity weakness.  Psychiatric/Behavioral: Positive for sleep disturbance and dysphoric mood. Negative for suicidal ideas, behavioral problems, confusion, decreased concentration and agitation. The patient is nervous/anxious.        Objective:   Physical Exam  Constitutional: She appears well-developed and well-nourished. No distress.  HENT:  Head: Normocephalic.  Mouth/Throat: Oropharynx is clear and moist. No oropharyngeal exudate.  Eyes:  Conjunctivae are normal. No scleral icterus.  Neck: Normal range of motion. Neck supple. No JVD present. No thyromegaly present.  Cardiovascular: Normal rate, regular rhythm, normal heart sounds and intact distal pulses.  Exam reveals no gallop and no friction rub.   No murmur heard. Pulmonary/Chest: Effort normal and breath sounds normal. No respiratory distress. She has no wheezes. She has no rales. She exhibits no tenderness.  Abdominal: Soft. Bowel sounds are normal. She exhibits no distension and no mass. There is no tenderness. There is no rebound and no guarding.  Musculoskeletal:       Right lower leg: She exhibits tenderness. She exhibits no bony tenderness, no swelling, no edema, no deformity and no laceration.  Palpable spasm to the right gastroc.  No swelling.  Negative holmans sign.  No cords to the posterior knee.  No right groin tenderness.  No femoral vein tenderness.    Lymphadenopathy:    She has no cervical adenopathy.  Skin: Skin is warm and dry. She is not diaphoretic.  Psychiatric: She has a normal mood and affect. Her behavior is normal. Judgment and thought content normal.  Nursing note and vitals reviewed.   Filed Vitals:   05/11/16 0907  BP: 128/82  Pulse: 76  Temp: 98 F (36.7 C)  Resp: 18          Assessment & Plan:    1. Muscle spasm of calf - baclofen (LIORESAL) 10 MG tablet; Take 1 tablet (10 mg total) by mouth daily.  Dispense: 30  tablet; Refill: 1 - meloxicam (MOBIC) 15 MG tablet; Take 1 tablet (15 mg total) by mouth daily.  Dispense: 90 tablet; Refill: 1 -heat -gentle stretching -magnesium  2. Insomnia  - traZODone (DESYREL) 50 MG tablet; Take 1 tablet (50 mg total) by mouth at bedtime.  Dispense: 90 tablet; Refill: 1

## 2016-05-11 NOTE — Patient Instructions (Signed)
Please take trazodone about 30 minutes prior to going to bed to help you fall asleep and stay asleep.  The first time you take the medication please take it on a day where you do not have any early morning engagements so you know how you react to the medication.  Please take magnesium 250 mg twice daily.  This is available over the counter.  Please take baclofen up to 3 times per day as needed for the muscle spasm in your calf.   Please take meloxicam with food daily for the next two weeks.  Do not take advil, ibuprofen, aleve, or any other antiinflammatory medication with it.    Please use heat on your leg.  Do some gentle stretching of the calf muscles as you can tolerate.

## 2016-05-15 DIAGNOSIS — F23 Brief psychotic disorder: Secondary | ICD-10-CM | POA: Diagnosis not present

## 2016-05-15 DIAGNOSIS — F29 Unspecified psychosis not due to a substance or known physiological condition: Secondary | ICD-10-CM | POA: Diagnosis not present

## 2016-06-02 ENCOUNTER — Encounter: Payer: Self-pay | Admitting: Internal Medicine

## 2016-06-13 ENCOUNTER — Encounter: Payer: Self-pay | Admitting: Internal Medicine

## 2016-06-15 ENCOUNTER — Ambulatory Visit (INDEPENDENT_AMBULATORY_CARE_PROVIDER_SITE_OTHER): Payer: 59 | Admitting: Internal Medicine

## 2016-06-15 ENCOUNTER — Encounter: Payer: Self-pay | Admitting: Internal Medicine

## 2016-06-15 VITALS — BP 126/78 | HR 78 | Temp 98.0°F | Resp 18 | Ht 67.0 in | Wt 220.0 lb

## 2016-06-15 DIAGNOSIS — Z79899 Other long term (current) drug therapy: Secondary | ICD-10-CM | POA: Diagnosis not present

## 2016-06-15 DIAGNOSIS — R1031 Right lower quadrant pain: Secondary | ICD-10-CM | POA: Diagnosis not present

## 2016-06-15 LAB — CBC WITH DIFFERENTIAL/PLATELET
Basophils Absolute: 0 cells/uL (ref 0–200)
Basophils Relative: 0 %
Eosinophils Absolute: 153 cells/uL (ref 15–500)
Eosinophils Relative: 3 %
HEMATOCRIT: 40.2 % (ref 35.0–45.0)
Hemoglobin: 13 g/dL (ref 11.7–15.5)
LYMPHS PCT: 57 %
Lymphs Abs: 2907 cells/uL (ref 850–3900)
MCH: 28.7 pg (ref 27.0–33.0)
MCHC: 32.3 g/dL (ref 32.0–36.0)
MCV: 88.7 fL (ref 80.0–100.0)
MONO ABS: 459 {cells}/uL (ref 200–950)
MPV: 9.3 fL (ref 7.5–12.5)
Monocytes Relative: 9 %
NEUTROS PCT: 31 %
Neutro Abs: 1581 cells/uL (ref 1500–7800)
Platelets: 265 10*3/uL (ref 140–400)
RBC: 4.53 MIL/uL (ref 3.80–5.10)
RDW: 13.4 % (ref 11.0–15.0)
WBC: 5.1 10*3/uL (ref 3.8–10.8)

## 2016-06-15 MED ORDER — CIPROFLOXACIN HCL 500 MG PO TABS: 500.0000 mg | ORAL_TABLET | Freq: Two times a day (BID) | ORAL | Status: AC

## 2016-06-15 MED ORDER — TRAMADOL HCL 50 MG PO TABS: 50.0000 mg | ORAL_TABLET | Freq: Three times a day (TID) | ORAL | Status: AC

## 2016-06-15 NOTE — Progress Notes (Signed)
   Subjective:    Patient ID: Rebecca [name], female    DOB: [birth year], 45 y.o.   MRN: [id number]  Abdominal Pain Associated symptoms include constipation. Pertinent negatives include no diarrhea, dysuria, fever, frequency, nausea or vomiting.  Patient presents to the office for evaluation of RLQ pain and groin pain which has been bothering her for a week. Pain is intermittent and waxing and waning.  She reports that initially it felt like she was about to have a period but she hasn't had a period in a long time.  Last period was roughly 3 years ago.  She does have a remote history of ovarian cysts.  No urinary symptoms.  No vaginal discharge or bleeding.  She reports that she has had no changes in her bowel habits.  She reports that she has had a kidney stone in the past.  She reports that she is not sure whether the stone passed.    Review of Systems  Constitutional: Positive for chills. Negative for fever.  Respiratory: Negative for shortness of breath.   Cardiovascular: Negative for chest pain and palpitations.  Gastrointestinal: Positive for abdominal pain and constipation. Negative for nausea, vomiting, diarrhea and blood in stool.  Genitourinary: Positive for flank pain. Negative for dysuria, urgency, frequency, decreased urine volume, vaginal bleeding, vaginal discharge, difficulty urinating and vaginal pain.       Objective:   Physical Exam  Constitutional: She is oriented to person, place, and time. She appears well-developed and well-nourished. No distress.  HENT:  Head: Normocephalic.  Mouth/Throat: Oropharynx is clear and moist. No oropharyngeal exudate.  Eyes: Conjunctivae are normal. No scleral icterus.  Neck: Normal range of motion. Neck supple. No JVD present. No thyromegaly present.  Cardiovascular: Normal rate, regular rhythm, normal heart sounds and intact distal pulses.   Pulmonary/Chest: Effort normal and breath sounds normal.  Abdominal: Soft. Bowel sounds are  normal. She exhibits no distension and no mass. There is tenderness in the right lower quadrant. There is no rigidity, no rebound, no guarding, no CVA tenderness, no tenderness at McBurney's point and negative Murphy's sign.  Musculoskeletal: Normal range of motion.  Lymphadenopathy:    She has no cervical adenopathy.  Neurological: She is alert and oriented to person, place, and time.  Skin: Skin is warm and dry. She is not diaphoretic.  Psychiatric: She has a normal mood and affect. Her behavior is normal. Judgment and thought content normal.    Filed Vitals:   06/15/16 1501  BP: 126/78  Pulse: 78  Temp: 98 F (36.7 C)  Resp: 18        Assessment & Plan:    1. RLQ abdominal pain -possible ovarian cyst vs. Muscle strain vs ibs - Urinalysis, Routine w reflex microscopic (not at [facility]) - Culture, Urine - US Pelvis Complete; Future  2. Medication management  - CBC with Differential/Platelet - BASIC METABOLIC PANEL WITH GFR

## 2016-06-16 ENCOUNTER — Other Ambulatory Visit: Payer: Self-pay | Admitting: Internal Medicine

## 2016-06-16 DIAGNOSIS — R1031 Right lower quadrant pain: Principal | ICD-10-CM

## 2016-06-16 DIAGNOSIS — G8929 Other chronic pain: Secondary | ICD-10-CM

## 2016-06-16 LAB — BASIC METABOLIC PANEL WITH GFR
BUN: 10 mg/dL (ref 7–25)
CALCIUM: 9.2 mg/dL (ref 8.6–10.2)
CO2: 27 mmol/L (ref 20–31)
Chloride: 104 mmol/L (ref 98–110)
Creat: 0.84 mg/dL (ref 0.50–1.10)
GFR, EST NON AFRICAN AMERICAN: 83 mL/min (ref >=60)
GLUCOSE: 88 mg/dL (ref 65–99)
Potassium: 4.8 mmol/L (ref 3.5–5.3)
Sodium: 140 mmol/L (ref 135–146)

## 2016-06-16 LAB — URINALYSIS, ROUTINE W REFLEX MICROSCOPIC
BILIRUBIN URINE: NEGATIVE
Glucose, UA: NEGATIVE
HGB URINE DIPSTICK: NEGATIVE
KETONES UR: NEGATIVE
Leukocytes, UA: NEGATIVE
Nitrite: NEGATIVE
PROTEIN: NEGATIVE
SPECIFIC GRAVITY, URINE: 1.016 (ref 1.001–1.035)
pH: 6 (ref 5.0–8.0)

## 2016-06-17 LAB — URINE CULTURE

## 2016-06-22 ENCOUNTER — Ambulatory Visit (HOSPITAL_COMMUNITY): Payer: 59

## 2016-08-18 ENCOUNTER — Other Ambulatory Visit: Payer: Self-pay | Admitting: Obstetrics and Gynecology

## 2016-08-18 DIAGNOSIS — Z1231 Encounter for screening mammogram for malignant neoplasm of breast: Secondary | ICD-10-CM

## 2016-08-22 ENCOUNTER — Ambulatory Visit
Admission: RE | Admit: 2016-08-22 | Discharge: 2016-08-22 | Disposition: A | Discharge status: Home / Self Care | Payer: 59 | Source: Ambulatory Visit | Attending: Obstetrics and Gynecology | Admitting: Obstetrics and Gynecology

## 2016-08-22 DIAGNOSIS — Z1231 Encounter for screening mammogram for malignant neoplasm of breast: Secondary | ICD-10-CM | POA: Diagnosis not present

## 2016-09-14 ENCOUNTER — Other Ambulatory Visit: Payer: Self-pay | Admitting: Internal Medicine

## 2016-09-14 ENCOUNTER — Encounter: Payer: Self-pay | Admitting: Internal Medicine

## 2016-09-14 MED FILL — DULoxetine HCL 60 MG CPEP: 60 | 30 days supply | Qty: 30 | Fill #0

## 2016-09-15 ENCOUNTER — Ambulatory Visit (INDEPENDENT_AMBULATORY_CARE_PROVIDER_SITE_OTHER): Payer: 59

## 2016-09-15 DIAGNOSIS — Z23 Encounter for immunization: Secondary | ICD-10-CM

## 2016-09-27 ENCOUNTER — Ambulatory Visit (INDEPENDENT_AMBULATORY_CARE_PROVIDER_SITE_OTHER): Payer: 59 | Admitting: Internal Medicine

## 2016-09-27 ENCOUNTER — Other Ambulatory Visit: Payer: Self-pay | Admitting: Internal Medicine

## 2016-09-27 ENCOUNTER — Encounter: Payer: Self-pay | Admitting: Internal Medicine

## 2016-09-27 VITALS — BP 138/80 | Temp 98.0°F | Resp 18 | Ht 66.75 in | Wt 219.0 lb

## 2016-09-27 DIAGNOSIS — E559 Vitamin D deficiency, unspecified: Secondary | ICD-10-CM | POA: Diagnosis not present

## 2016-09-27 DIAGNOSIS — Z130 Encounter for screening for diseases of the blood and blood-forming organs and certain disorders involving the immune mechanism: Secondary | ICD-10-CM

## 2016-09-27 DIAGNOSIS — E782 Mixed hyperlipidemia: Secondary | ICD-10-CM | POA: Diagnosis not present

## 2016-09-27 DIAGNOSIS — R7303 Prediabetes: Secondary | ICD-10-CM | POA: Diagnosis not present

## 2016-09-27 DIAGNOSIS — I10 Essential (primary) hypertension: Secondary | ICD-10-CM | POA: Diagnosis not present

## 2016-09-27 DIAGNOSIS — Z0000 Encounter for general adult medical examination without abnormal findings: Secondary | ICD-10-CM | POA: Diagnosis not present

## 2016-09-27 DIAGNOSIS — Z136 Encounter for screening for cardiovascular disorders: Secondary | ICD-10-CM

## 2016-09-27 LAB — CBC WITH DIFFERENTIAL/PLATELET
BASOS ABS: 0 {cells}/uL (ref 0–200)
Basophils Relative: 0 %
EOS ABS: 100 {cells}/uL (ref 15–500)
Eosinophils Relative: 2 %
HCT: 40.6 % (ref 35.0–45.0)
HEMOGLOBIN: 13.1 g/dL (ref 11.7–15.5)
Lymphocytes Relative: 56 %
Lymphs Abs: 2800 cells/uL (ref 850–3900)
MCH: 28.8 pg (ref 27.0–33.0)
MCHC: 32.3 g/dL (ref 32.0–36.0)
MCV: 89.2 fL (ref 80.0–100.0)
MONOS PCT: 8 %
MPV: 9.6 fL (ref 7.5–12.5)
Monocytes Absolute: 400 cells/uL (ref 200–950)
NEUTROS ABS: 1700 {cells}/uL (ref 1500–7800)
NEUTROS PCT: 34 %
PLATELETS: 283 10*3/uL (ref 140–400)
RBC: 4.55 MIL/uL (ref 3.80–5.10)
RDW: 13.5 % (ref 11.0–15.0)
WBC: 5 10*3/uL (ref 3.8–10.8)

## 2016-09-27 LAB — HEPATIC FUNCTION PANEL
ALBUMIN: 4.1 g/dL (ref 3.6–5.1)
ALT: 19 U/L (ref 6–29)
AST: 15 U/L (ref 10–35)
Alkaline Phosphatase: 81 U/L (ref 33–115)
Bilirubin, Direct: 0.1 mg/dL (ref <=0.2)
Indirect Bilirubin: 0.4 mg/dL (ref 0.2–1.2)
Total Bilirubin: 0.5 mg/dL (ref 0.2–1.2)
Total Protein: 6.6 g/dL (ref 6.1–8.1)

## 2016-09-27 LAB — BASIC METABOLIC PANEL WITH GFR
BUN: 13 mg/dL (ref 7–25)
CHLORIDE: 104 mmol/L (ref 98–110)
CO2: 29 mmol/L (ref 20–31)
CREATININE: 0.85 mg/dL (ref 0.50–1.10)
Calcium: 9.5 mg/dL (ref 8.6–10.2)
GFR, Est African American: >89 mL/min (ref >=60)
GFR, Est Non African American: 82 mL/min (ref >=60)
Glucose, Bld: 142 mg/dL — ABNORMAL HIGH (ref 65–99)
Potassium: 3.9 mmol/L (ref 3.5–5.3)
Sodium: 141 mmol/L (ref 135–146)

## 2016-09-27 LAB — LIPID PANEL
Cholesterol: 189 mg/dL (ref 125–200)
HDL: 52 mg/dL (ref >=46)
LDL Cholesterol: 119 mg/dL (ref <130)
Total CHOL/HDL Ratio: 3.6 Ratio (ref <=5.0)
Triglycerides: 90 mg/dL (ref <150)
VLDL: 18 mg/dL (ref <30)

## 2016-09-27 LAB — VITAMIN B12: Vitamin B-12: 627 pg/mL (ref 200–1100)

## 2016-09-27 LAB — TSH: TSH: 0.74 m[IU]/L

## 2016-09-27 MED ORDER — BUPROPION HCL ER (XL) 150 MG PO TB24: 150.0000 mg | ORAL_TABLET | ORAL | 2 refills | Status: DC

## 2016-09-27 MED ORDER — METOPROLOL SUCCINATE ER 25 MG PO TB24: 25.0000 mg | ORAL_TABLET | Freq: Every day | ORAL | 11 refills | Status: DC

## 2016-09-27 MED ORDER — DULOXETINE HCL 60 MG PO CPEP: 60.0000 mg | ORAL_CAPSULE | Freq: Every day | ORAL | 3 refills | Status: DC

## 2016-09-27 MED ORDER — ALPRAZOLAM 0.5 MG PO TABS: 0.5000 mg | ORAL_TABLET | Freq: Three times a day (TID) | ORAL | 0 refills | Status: AC | PRN

## 2016-09-27 MED ORDER — METRONIDAZOLE 0.75 % VA GEL: 1.0000 | Freq: Every day | VAGINAL | 0 refills | Status: DC

## 2016-09-27 MED FILL — VANDAZOLE VAGINAL 0.75% GEL: 0.75 | 5 days supply | Qty: 70 | Fill #0

## 2016-09-27 MED FILL — METOPROLOL SUCC ER 25 MG TA: 25 | 30 days supply | Qty: 30 | Fill #0

## 2016-09-27 MED FILL — BUPROPION HCL XL 150 MG TAB: 150 | 30 days supply | Qty: 30 | Fill #0

## 2016-09-27 MED FILL — ALPRAZolam 0.5 MG TABS: 0.5 | 20 days supply | Qty: 60 | Fill #0

## 2016-09-27 NOTE — Progress Notes (Signed)
Complete Physical  Assessment and Plan:   1. Essential hypertension -cont current meds -dash diet -monitor at home - Urinalysis, Routine w reflex microscopic (not at [facility]) - Microalbumin / creatinine urine ratio - EKG 12-Lead - TSH  2. Mixed hyperlipidemia -cont diet and exercise - Lipid panel  3. Prediabetes -cont diet and exercise -weight gain due to school and inactivity -increased exercise encouraged. - Hemoglobin A1c - Insulin, random  4. Vitamin D deficiency -cont supplement - VITAMIN D 25 Hydroxy (Vit-D Deficiency, Fractures)  5. Routine general medical examination at a health care facility  - CBC with Differential/Platelet - BASIC METABOLIC PANEL WITH GFR - Hepatic function panel - Magnesium  6. Screening, deficiency anemia, iron  - Iron and TIBC - Vitamin B12  Patient is overdue for OBGyn visit.  Encourged to get this taken care of over her christmas break for school.  Important to have pap smears done.  Given history of fibroid and also ovarian cyst feel that it is better for  Her to remain established with gyn.    Discussed med's effects and SE's. Screening labs and tests as requested with regular follow-up as recommended.  HPI  45 y.o. female  presents for a complete physical.  Her blood pressure has been controlled at home, today their BP is BP: 138/80.  She does not workout. She denies chest pain, shortness of breath, dizziness.   She is not on cholesterol medication and denies myalgias. Her cholesterol is not at goal. The cholesterol last visit was:  Lab Results  Component Value Date   CHOL 179 06/03/2015   HDL 45 (L) 06/03/2015   LDLCALC 104 (H) 06/03/2015   TRIG 152 (H) 06/03/2015   CHOLHDL 4.0 06/03/2015  .  She has been working on diet and exercise for prediabetes, she is not on bASA, she is not on ACE/ARB and denies foot ulcerations, hyperglycemia, hypoglycemia , increased appetite, nausea, paresthesia of the feet, polydipsia, polyuria,  visual disturbances, vomiting and weight loss. Last A1C in the office was:  Lab Results  Component Value Date   HGBA1C 6.1 (H) 06/03/2015    Patient is on Vitamin D supplement.   Lab Results  Component Value Date   VD25OH 24 (L) 06/03/2015     She reports that she is currently having a vaginal discharge. She notes that this started about a week ago.  She notes that she changed soaps.  She does get BV pretty regularly.  She does not see obgyn regularly.  She reports that she is due for a pap smear.  She is post menopausal.    She is still in school.  She is in her final year.  She will be done in January of next year.      Current Medications:  Current Outpatient Prescriptions on File Prior to Visit  Medication Sig Dispense Refill  . baclofen (LIORESAL) 10 MG tablet Take 1 tablet (10 mg total) by mouth daily. 30 tablet 1  . BIOTIN PO Take 1 capsule by mouth daily.    . cholecalciferol (VITAMIN D) 1000 UNITS tablet Take 5,000 Units by mouth daily.    . DULoxetine (CYMBALTA) 60 MG capsule TAKE 1 CAPSULE BY MOUTH ONCE DAILY 30 capsule 0  . ferrous sulfate 325 (65 FE) MG tablet Take 325 mg by mouth daily with breakfast.    . Multiple Vitamin (MULTIVITAMIN WITH MINERALS) TABS tablet Take 1 tablet by mouth daily.    . traMADol (ULTRAM) 50 MG tablet Take 1 tablet (  50 mg total) by mouth every 8 (eight) hours. 40 tablet 0  . traZODone (DESYREL) 50 MG tablet Take 1 tablet (50 mg total) by mouth at bedtime. 90 tablet 1   No current facility-administered medications on file prior to visit.     Health Maintenance:   Immunization History  Administered Date(s) Administered  . Influenza, Seasonal, Injecte, Preservative Fre 09/15/2016  . Tdap 02/27/2013    Tetanus: 2014 Pneumovax: Declined Flu vaccine: 2017 Pap: Handled by obgyn MGM: 9/17 Colonoscopy: 2013, due in 2023 Last Dental Exam: twice yearly Last Eye Exam:  [place]  Patient Care Team: Unk Pinto, MD as PCP - General  (Internal Medicine) Minette Headland, DDS as Referring Physician (Dental General Practice) Rutherford Guys, MD as Consulting Physician (Ophthalmology) Jerene Bears, MD as Consulting Physician (Gastroenterology) Servando Salina, MD as Consulting Physician (Obstetrics and Gynecology) Kennith Center, RD as Dietitian (Family Medicine)  Allergies: No Known Allergies  Medical History:  Past Medical History:  Diagnosis Date  . Anemia   . Colon polyp 12/25/2011   Tubular adenomatous  . Depression    following death of mother  . Duodenitis   . DVT (deep vein thrombosis) in pregnancy ([place])   . Esophagitis   . Fatigue   . Gallstones   . GERD (gastroesophageal reflux disease)   . Hiatal hernia   . Infection    UTI  . Obesity   . PUD (peptic ulcer disease)   . Sacroiliac pain   . Vaginal Pap smear, abnormal    colpo, results ok  . Vitamin D deficiency     Surgical History:  Past Surgical History:  Procedure Laterality Date  . BREAST LUMPECTOMY Left    benign  . COLPOSCOPY  2012   NEG  . TUBAL LIGATION      Family History:  Family History  Problem Relation Age of Onset  . Aneurysm Mother   . Heart disease Mother   . Hypertension Mother   . Stroke Mother   . Lupus Mother   . Diabetes Maternal Aunt   . Cancer Maternal Aunt   . Colon cancer Neg Hx     Social History:  Social History  Substance Use Topics  . Smoking status: Current Some Day Smoker    Packs/day: 0.10    Types: Cigarettes  . Smokeless tobacco: Never Used  . Alcohol use 0.0 oz/week     Comment: occasional glass of wine    Review of Systems: Review of Systems  Constitutional: Negative for chills, fever and malaise/fatigue.  HENT: Negative for congestion, ear pain and sore throat.   Eyes: Negative.   Respiratory: Negative for cough, shortness of breath and wheezing.   Cardiovascular: Negative for chest pain, palpitations and leg swelling.  Gastrointestinal: Negative for abdominal pain, blood in stool,  constipation, diarrhea, heartburn and melena.  Genitourinary: Negative.   Skin: Negative.   Neurological: Negative for dizziness, sensory change, loss of consciousness and headaches.  Psychiatric/Behavioral: Negative for depression. The patient is not nervous/anxious and does not have insomnia.     Physical Exam: Estimated body mass index is 34.56 kg/m as calculated from the following:   Height as of this encounter: 5' 6.75" (1.695 m).   Weight as of this encounter: 219 lb (99.3 kg). BP 138/80   Temp 98 F (36.7 C) (Temporal)   Resp 18   Ht 5' 6.75" (1.695 m)   Wt 219 lb (99.3 kg)   LMP 08/13/2013 Comment: negative urine pregnancy test 12-2013  BMI 34.56 kg/m   General Appearance: Well nourished well developed, in no apparent distress.  Eyes: PERRLA, EOMs, conjunctiva no swelling or erythema ENT/Mouth: Ear canals normal without obstruction, swelling, erythema, or discharge.  TMs normal bilaterally with no erythema, bulging, retraction, or loss of landmark.  Oropharynx moist and clear with no exudate, erythema, or swelling.   Neck: Supple, thyroid normal. No bruits.  No cervical adenopathy Respiratory: Respiratory effort normal, Breath sounds clear A&P without wheeze, rhonchi, rales.   Cardio: RRR without murmurs, rubs or gallops. Brisk peripheral pulses without edema.  Chest: symmetric, with normal excursions Breasts: Symmetric, without lumps, nipple discharge, retractions.  Abdomen: Soft, nontender, no guarding, rebound, hernias, masses, or organomegaly.  Lymphatics: Non tender without lymphadenopathy.  Genitourinary: Deferred to obgyn Musculoskeletal: Full ROM all peripheral extremities,5/5 strength, and normal gait.  Skin: Warm, dry without rashes, lesions, ecchymosis. Neuro: Awake and oriented X 3, Cranial nerves intact, reflexes equal bilaterally. Normal muscle tone, no cerebellar symptoms. Sensation intact.  Psych:  normal affect, Insight and Judgment appropriate.   EKG:  WNL no changes.  Over 40 minutes of exam, counseling, chart review and critical decision making was performed  Loma Sousa Forcucci 3:08 PM [facility]

## 2016-09-28 ENCOUNTER — Encounter: Payer: Self-pay | Admitting: *Deleted

## 2016-09-28 LAB — URINALYSIS, ROUTINE W REFLEX MICROSCOPIC
BILIRUBIN URINE: NEGATIVE
GLUCOSE, UA: NEGATIVE
Hgb urine dipstick: NEGATIVE
Ketones, ur: NEGATIVE
Leukocytes, UA: NEGATIVE
Nitrite: NEGATIVE
PH: 6.5 (ref 5.0–8.0)
Protein, ur: NEGATIVE
SPECIFIC GRAVITY, URINE: 1.012 (ref 1.001–1.035)

## 2016-09-28 LAB — HEMOGLOBIN A1C
HEMOGLOBIN A1C: 5.7 % — AB (ref <5.7)
MEAN PLASMA GLUCOSE: 117 mg/dL

## 2016-09-28 LAB — IRON AND TIBC
%SAT: 32 % (ref 11–50)
IRON: 94 ug/dL (ref 40–190)
TIBC: 294 ug/dL (ref 250–450)
UIBC: 200 ug/dL (ref 125–400)

## 2016-09-28 LAB — VITAMIN D 25 HYDROXY (VIT D DEFICIENCY, FRACTURES): VIT D 25 HYDROXY: 28 ng/mL — AB (ref 30–100)

## 2016-09-28 LAB — MICROALBUMIN / CREATININE URINE RATIO
Creatinine, Urine: 118 mg/dL (ref 20–320)
MICROALB/CREAT RATIO: 2 ug/mg{creat} (ref <30)
Microalb, Ur: 0.2 mg/dL

## 2016-09-28 LAB — MAGNESIUM: MAGNESIUM: 2 mg/dL (ref 1.5–2.5)

## 2016-09-28 LAB — INSULIN, RANDOM: INSULIN: 55.6 u[IU]/mL — AB (ref 2.0–19.6)

## 2016-10-13 MED FILL — metroNIDAZOLE 500 MG TABS: 500 | 10 days supply | Qty: 20 | Fill #0

## 2016-10-18 ENCOUNTER — Telehealth: Payer: Self-pay | Admitting: *Deleted

## 2016-10-18 NOTE — Telephone Encounter (Signed)
Patient called stating Metrogel Rx did not offer much relief for her vaginal symptoms.  Per Starlyn Skeans, PA-C orders, advised patient she will need to f/u with her Gyn for further E/T.  Dr. Melford Aase suggested she could also do a vinegar douche to help relieve some of her symptoms.  Patient expressed understanding.

## 2016-10-23 MED FILL — DULoxetine HCL 60 MG CPEP: 60 | 30 days supply | Qty: 30 | Fill #0

## 2016-10-26 ENCOUNTER — Telehealth: Payer: Self-pay | Admitting: Internal Medicine

## 2016-10-26 ENCOUNTER — Other Ambulatory Visit: Payer: Self-pay | Admitting: Internal Medicine

## 2016-10-26 ENCOUNTER — Encounter: Payer: Self-pay | Admitting: Internal Medicine

## 2016-10-26 DIAGNOSIS — N644 Mastodynia: Secondary | ICD-10-CM

## 2016-10-26 DIAGNOSIS — Z1231 Encounter for screening mammogram for malignant neoplasm of breast: Secondary | ICD-10-CM

## 2016-10-26 NOTE — Telephone Encounter (Signed)
Patient called [facility] with bilateral nipple pain and left breast pain. Recent screening mammogram 07-2016 at [facility]. They advised patient to call PCP.  Please order Bilateral Diagnostic Tomo [id number],  [place] Left [id number], and [place] Right [id number].   Left breast pain at 2o'clock.  Called patient back and made her aware orders placed and to please call to schedule imaging at GI, [phone].

## 2016-10-29 MED FILL — METOPROLOL SUCC ER 25 MG TA: 25 | 30 days supply | Qty: 30 | Fill #1

## 2016-10-30 MED FILL — BUPROPION HCL XL 150 MG TAB: 150 | 30 days supply | Qty: 30 | Fill #1

## 2016-10-31 ENCOUNTER — Encounter: Payer: Self-pay | Admitting: Internal Medicine

## 2016-10-31 ENCOUNTER — Ambulatory Visit (INDEPENDENT_AMBULATORY_CARE_PROVIDER_SITE_OTHER): Payer: 59 | Admitting: Internal Medicine

## 2016-10-31 ENCOUNTER — Other Ambulatory Visit (HOSPITAL_COMMUNITY)
Admission: RE | Admit: 2016-10-31 | Discharge: 2016-10-31 | Disposition: A | Discharge status: Home / Self Care | Payer: 59 | Source: Ambulatory Visit | Attending: Internal Medicine | Admitting: Internal Medicine

## 2016-10-31 VITALS — BP 132/76 | HR 74 | Temp 98.0°F | Resp 16 | Ht 66.75 in | Wt 222.0 lb

## 2016-10-31 DIAGNOSIS — N950 Postmenopausal bleeding: Secondary | ICD-10-CM

## 2016-10-31 DIAGNOSIS — Z01411 Encounter for gynecological examination (general) (routine) with abnormal findings: Secondary | ICD-10-CM | POA: Diagnosis not present

## 2016-10-31 LAB — CBC WITH DIFFERENTIAL/PLATELET
BASOS ABS: 0 {cells}/uL (ref 0–200)
Basophils Relative: 0 %
EOS ABS: 122 {cells}/uL (ref 15–500)
Eosinophils Relative: 2 %
HCT: 38.7 % (ref 35.0–45.0)
Hemoglobin: 12.6 g/dL (ref 11.7–15.5)
Lymphocytes Relative: 49 %
Lymphs Abs: 2989 cells/uL (ref 850–3900)
MCH: 29.2 pg (ref 27.0–33.0)
MCHC: 32.6 g/dL (ref 32.0–36.0)
MCV: 89.8 fL (ref 80.0–100.0)
MONOS PCT: 11 %
MPV: 8.9 fL (ref 7.5–12.5)
Monocytes Absolute: 671 cells/uL (ref 200–950)
NEUTROS ABS: 2318 {cells}/uL (ref 1500–7800)
NEUTROS PCT: 38 %
PLATELETS: 260 10*3/uL (ref 140–400)
RBC: 4.31 MIL/uL (ref 3.80–5.10)
RDW: 13.4 % (ref 11.0–15.0)
WBC: 6.1 10*3/uL (ref 3.8–10.8)

## 2016-10-31 NOTE — Progress Notes (Signed)
Subjective:    Patient ID: Rebecca [name], female    DOB: Apr 15, 1971, 45 y.o.   MRN: [id number]  HPI  Patient presents to the office for evaluation of post menopausal bleeding.  Patient reports that for the last week she has been having to use a pad.  She reports that she has been having some breast soreness as well.  She reports that her last period was in 2014.  She reports that she did have some vaginal discharge before thanksgiving and that did stop.  She reports that she is having some cramping.  She is passing some clots.  She is not soaking 2-3 pads per hour it is more like a lighter bleeding. She has no female history of cancers.  She was 10 at the onset of menarche.  She had very regular periods when seh had them.  She did have her tubes tied and they then got lighter.  She reports that she did not breast feed. She was never on birth control.  She did have all vaginal births.  She has seen Dr. Garwin Brothers in the past.     Review of Systems  Genitourinary: Positive for vaginal bleeding and vaginal discharge. Negative for decreased urine volume, difficulty urinating, dysuria, frequency, hematuria, menstrual problem, pelvic pain, urgency and vaginal pain.       Objective:   Physical Exam  Constitutional: She appears well-developed and well-nourished. No distress.  HENT:  Head: Normocephalic.  Mouth/Throat: Oropharynx is clear and moist. No oropharyngeal exudate.  Eyes: Conjunctivae are normal. No scleral icterus.  Neck: Normal range of motion. Neck supple. No JVD present. No thyromegaly present.  Cardiovascular: Normal rate, regular rhythm, normal heart sounds and intact distal pulses.  Exam reveals no gallop and no friction rub.   No murmur heard. Pulmonary/Chest: Effort normal and breath sounds normal. No respiratory distress. She has no wheezes. She has no rales. She exhibits no tenderness.  Abdominal: Soft. Bowel sounds are normal. She exhibits no distension and no mass. There is  no tenderness. There is no rebound and no guarding.  Genitourinary: No labial fusion. There is no rash, tenderness, lesion or injury on the right labia. There is no rash, tenderness, lesion or injury on the left labia. Uterus is not enlarged and not tender. Cervix exhibits no motion tenderness. Right adnexum displays no mass, no tenderness and no fullness. Left adnexum displays no mass, no tenderness and no fullness. There is bleeding in the vagina. No erythema or tenderness in the vagina. No foreign body in the vagina. No signs of injury around the vagina. No vaginal discharge found.  Genitourinary Comments: Bright red blood in the vaginal vault, greater than 2 tablespoons.  Cervix that was visualized given bleeding had a fairly normal contour but given lack of equipment full visual inspection was limited.  Cervix contour was normal.  Bilateral ovaries non-tender without palpable masses.  NO uterine abnormalities were palpable.    Lymphadenopathy:    She has no cervical adenopathy.  Skin: She is not diaphoretic.  Nursing note and vitals reviewed.   Vitals:   10/31/16 1526  BP: 132/76  Pulse: 74  Resp: 16  Temp: 98 F (36.7 C)         Assessment & Plan:    1. Post-menopausal bleeding -active bleeding visualized.  Given 3 years without period and most recent trasvaginal ultrasound with no evidence fibroids, or visible polyps have suspicion for endometrial cancer.  Will likely need biopsy done.  Will refer  to Dr. Garwin Brothers urgently.  Will check hormonal  Labs.  Patient is also due for mammogram tomorrow for breast tenderness.  Will also get prolactin level due to breast tenderness.     - CBC with Diff - Pregnancy Test, Serum, Qual - LH - FSH - Prolactin - Cytology -Pap Smear - WET PREP BY MOLECULAR PROBE - GC/Chlamydia Probe Amp - Ambulatory referral to Obstetrics / Gynecology

## 2016-11-01 ENCOUNTER — Ambulatory Visit
Admission: RE | Admit: 2016-11-01 | Discharge: 2016-11-01 | Disposition: A | Discharge status: Home / Self Care | Payer: 59 | Source: Ambulatory Visit | Attending: Internal Medicine | Admitting: Internal Medicine

## 2016-11-01 ENCOUNTER — Ambulatory Visit: Payer: 59

## 2016-11-01 DIAGNOSIS — R922 Inconclusive mammogram: Secondary | ICD-10-CM | POA: Diagnosis not present

## 2016-11-01 DIAGNOSIS — N644 Mastodynia: Secondary | ICD-10-CM

## 2016-11-01 LAB — FOLLICLE STIMULATING HORMONE: FSH: 43.5 m[IU]/mL

## 2016-11-01 LAB — GC/CHLAMYDIA PROBE AMP
CT Probe RNA: NOT DETECTED
GC Probe RNA: NOT DETECTED

## 2016-11-01 LAB — WET PREP BY MOLECULAR PROBE
Candida species: NEGATIVE
Gardnerella vaginalis: NEGATIVE
Trichomonas vaginosis: NEGATIVE

## 2016-11-01 LAB — PROLACTIN: PROLACTIN: 6.8 ng/mL

## 2016-11-01 LAB — LUTEINIZING HORMONE: LH: 24.7 m[IU]/mL

## 2016-11-01 LAB — HCG, SERUM, QUALITATIVE: Preg, Serum: NEGATIVE

## 2016-11-03 LAB — CYTOLOGY - PAP: DIAGNOSIS: NEGATIVE

## 2016-11-15 DIAGNOSIS — N950 Postmenopausal bleeding: Secondary | ICD-10-CM | POA: Diagnosis not present

## 2016-11-28 ENCOUNTER — Other Ambulatory Visit: Payer: Self-pay | Admitting: Internal Medicine

## 2016-11-28 MED FILL — DULoxetine HCL 60 MG CPEP: 60 | 30 days supply | Qty: 30 | Fill #1

## 2016-11-28 MED FILL — BUPROPION HCL XL 150 MG TAB: 150 | 30 days supply | Qty: 30 | Fill #2

## 2016-11-28 MED FILL — METOPROLOL SUCC ER 25 MG TA: 25 | 30 days supply | Qty: 30 | Fill #2

## 2016-11-30 DIAGNOSIS — R930 Abnormal findings on diagnostic imaging of skull and head, not elsewhere classified: Secondary | ICD-10-CM | POA: Diagnosis not present

## 2016-11-30 DIAGNOSIS — N898 Other specified noninflammatory disorders of vagina: Secondary | ICD-10-CM | POA: Diagnosis not present

## 2016-11-30 DIAGNOSIS — N950 Postmenopausal bleeding: Secondary | ICD-10-CM | POA: Diagnosis not present

## 2016-12-14 MED FILL — CLINDAMYCIN HCL 150 MG CAPS: 150 | 7 days supply | Qty: 28 | Fill #0

## 2016-12-26 ENCOUNTER — Other Ambulatory Visit: Payer: Self-pay | Admitting: Obstetrics and Gynecology

## 2016-12-28 ENCOUNTER — Other Ambulatory Visit: Payer: Self-pay | Admitting: Internal Medicine

## 2016-12-28 ENCOUNTER — Encounter: Payer: Self-pay | Admitting: Internal Medicine

## 2016-12-28 MED ORDER — DULOXETINE HCL 60 MG PO CPEP: 60.0000 mg | ORAL_CAPSULE | Freq: Every day | ORAL | 1 refills | Status: DC

## 2016-12-28 MED ORDER — METOPROLOL SUCCINATE ER 25 MG PO TB24: 25.0000 mg | ORAL_TABLET | Freq: Every day | ORAL | 3 refills | Status: DC

## 2016-12-28 MED FILL — METOPROLOL SUCC ER 25 MG TA: 25 | 90 days supply | Qty: 90 | Fill #0 | Status: TO

## 2016-12-28 MED FILL — DULoxetine HCL 60 MG CPEP: 60 | 90 days supply | Qty: 90 | Fill #0

## 2016-12-29 ENCOUNTER — Other Ambulatory Visit: Payer: Self-pay | Admitting: Internal Medicine

## 2016-12-29 MED FILL — BUPROPION HCL XL 150 MG TAB: 150 | 90 days supply | Qty: 90 | Fill #0 | Status: TO

## 2017-01-10 DIAGNOSIS — H5213 Myopia, bilateral: Secondary | ICD-10-CM | POA: Diagnosis not present

## 2017-01-12 ENCOUNTER — Encounter (HOSPITAL_COMMUNITY)
Admission: RE | Admit: 2017-01-12 | Discharge: 2017-01-12 | Disposition: A | Discharge status: Home / Self Care | Payer: 59 | Source: Ambulatory Visit | Attending: Obstetrics and Gynecology | Admitting: Obstetrics and Gynecology

## 2017-01-12 ENCOUNTER — Encounter (HOSPITAL_COMMUNITY): Payer: Self-pay

## 2017-01-12 DIAGNOSIS — N83209 Unspecified ovarian cyst, unspecified side: Secondary | ICD-10-CM | POA: Diagnosis not present

## 2017-01-12 DIAGNOSIS — I10 Essential (primary) hypertension: Secondary | ICD-10-CM | POA: Diagnosis not present

## 2017-01-12 DIAGNOSIS — Z01812 Encounter for preprocedural laboratory examination: Secondary | ICD-10-CM | POA: Diagnosis not present

## 2017-01-12 DIAGNOSIS — D126 Benign neoplasm of colon, unspecified: Secondary | ICD-10-CM | POA: Diagnosis not present

## 2017-01-12 DIAGNOSIS — E669 Obesity, unspecified: Secondary | ICD-10-CM | POA: Insufficient documentation

## 2017-01-12 DIAGNOSIS — R1013 Epigastric pain: Secondary | ICD-10-CM | POA: Diagnosis not present

## 2017-01-12 DIAGNOSIS — E782 Mixed hyperlipidemia: Secondary | ICD-10-CM | POA: Diagnosis not present

## 2017-01-12 DIAGNOSIS — E559 Vitamin D deficiency, unspecified: Secondary | ICD-10-CM | POA: Diagnosis not present

## 2017-01-12 DIAGNOSIS — K219 Gastro-esophageal reflux disease without esophagitis: Secondary | ICD-10-CM | POA: Diagnosis not present

## 2017-01-12 DIAGNOSIS — R7303 Prediabetes: Secondary | ICD-10-CM | POA: Insufficient documentation

## 2017-01-12 HISTORY — DX: Myoneural disorder, unspecified: G70.9

## 2017-01-12 LAB — CBC
HCT: 37.9 % (ref 36.0–46.0)
HEMOGLOBIN: 12.9 g/dL (ref 12.0–15.0)
MCH: 29.7 pg (ref 26.0–34.0)
MCHC: 34 g/dL (ref 30.0–36.0)
MCV: 87.3 fL (ref 78.0–100.0)
PLATELETS: 258 10*3/uL (ref 150–400)
RBC: 4.34 MIL/uL (ref 3.87–5.11)
RDW: 13 % (ref 11.5–15.5)
WBC: 5.1 10*3/uL (ref 4.0–10.5)

## 2017-01-12 LAB — BASIC METABOLIC PANEL
ANION GAP: 5 (ref 5–15)
BUN: 13 mg/dL (ref 6–20)
CHLORIDE: 101 mmol/L (ref 101–111)
CO2: 29 mmol/L (ref 22–32)
Calcium: 9.4 mg/dL (ref 8.9–10.3)
Creatinine, Ser: 0.85 mg/dL (ref 0.44–1.00)
GFR calc Af Amer: >60 mL/min (ref >60)
GLUCOSE: 104 mg/dL — AB (ref 65–99)
Potassium: 4.3 mmol/L (ref 3.5–5.1)
SODIUM: 135 mmol/L (ref 135–145)

## 2017-01-12 NOTE — Pre-Procedure Instructions (Signed)
Copy of EKG viewed and okay 'd by Dr. Jillyn Hidden

## 2017-01-12 NOTE — Patient Instructions (Addendum)
Your procedure is scheduled on: Wednesday  January 17, 2017 at 1:00 pm  Enter through the Main Entrance of [facility] at: 11:30 am  Pick up the phone at the desk and dial [phone].  Call this number if you have problems the morning of surgery: [phone].  Remember: Do NOT eat food: Midnight on Tuesday February 20 Do NOT drink clear liquids after: 7:00 am  Take these medicines the morning of surgery with a SIP OF WATER: Toprol XL, Cymbalta, Xanax, Wellbutrin  Do NOT wear jewelry (body piercing), metal hair clips/bobby pins, make-up, or nail polish. Do NOT wear lotions, powders, or perfumes.  You may wear deoderant. Do NOT shave for 48 hours prior to surgery. Do NOT bring valuables to the hospital. Contacts, dentures, or bridgework may not be worn into surgery.. Have a responsible adult drive you home and stay with you for 24 hours after your procedure

## 2017-01-17 ENCOUNTER — Encounter (HOSPITAL_COMMUNITY)
Admission: RE | Disposition: A | Discharge status: Home / Self Care | Payer: Self-pay | Source: Ambulatory Visit | Attending: Obstetrics and Gynecology

## 2017-01-17 ENCOUNTER — Ambulatory Visit (HOSPITAL_COMMUNITY): Payer: 59 | Admitting: Certified Registered"

## 2017-01-17 ENCOUNTER — Ambulatory Visit (HOSPITAL_COMMUNITY)
Admission: RE | Admit: 2017-01-17 | Discharge: 2017-01-17 | Disposition: A | Discharge status: Home / Self Care | Payer: 59 | Source: Ambulatory Visit | Attending: Obstetrics and Gynecology | Admitting: Obstetrics and Gynecology

## 2017-01-17 ENCOUNTER — Encounter (HOSPITAL_COMMUNITY): Payer: Self-pay | Admitting: Certified Registered"

## 2017-01-17 DIAGNOSIS — K210 Gastro-esophageal reflux disease with esophagitis: Secondary | ICD-10-CM | POA: Diagnosis not present

## 2017-01-17 DIAGNOSIS — F17210 Nicotine dependence, cigarettes, uncomplicated: Secondary | ICD-10-CM | POA: Insufficient documentation

## 2017-01-17 DIAGNOSIS — Z8249 Family history of ischemic heart disease and other diseases of the circulatory system: Secondary | ICD-10-CM | POA: Insufficient documentation

## 2017-01-17 DIAGNOSIS — E559 Vitamin D deficiency, unspecified: Secondary | ICD-10-CM | POA: Diagnosis not present

## 2017-01-17 DIAGNOSIS — D649 Anemia, unspecified: Secondary | ICD-10-CM | POA: Diagnosis not present

## 2017-01-17 DIAGNOSIS — Z840 Family history of diseases of the skin and subcutaneous tissue: Secondary | ICD-10-CM | POA: Diagnosis not present

## 2017-01-17 DIAGNOSIS — Z809 Family history of malignant neoplasm, unspecified: Secondary | ICD-10-CM | POA: Insufficient documentation

## 2017-01-17 DIAGNOSIS — Z86010 Personal history of colonic polyps: Secondary | ICD-10-CM | POA: Diagnosis not present

## 2017-01-17 DIAGNOSIS — D250 Submucous leiomyoma of uterus: Secondary | ICD-10-CM | POA: Insufficient documentation

## 2017-01-17 DIAGNOSIS — Z79899 Other long term (current) drug therapy: Secondary | ICD-10-CM | POA: Insufficient documentation

## 2017-01-17 DIAGNOSIS — I10 Essential (primary) hypertension: Secondary | ICD-10-CM | POA: Diagnosis not present

## 2017-01-17 DIAGNOSIS — Z86718 Personal history of other venous thrombosis and embolism: Secondary | ICD-10-CM | POA: Insufficient documentation

## 2017-01-17 DIAGNOSIS — Z833 Family history of diabetes mellitus: Secondary | ICD-10-CM | POA: Insufficient documentation

## 2017-01-17 DIAGNOSIS — R87619 Unspecified abnormal cytological findings in specimens from cervix uteri: Secondary | ICD-10-CM | POA: Diagnosis not present

## 2017-01-17 DIAGNOSIS — K449 Diaphragmatic hernia without obstruction or gangrene: Secondary | ICD-10-CM | POA: Diagnosis not present

## 2017-01-17 DIAGNOSIS — N950 Postmenopausal bleeding: Secondary | ICD-10-CM | POA: Diagnosis not present

## 2017-01-17 DIAGNOSIS — Z8711 Personal history of peptic ulcer disease: Secondary | ICD-10-CM | POA: Insufficient documentation

## 2017-01-17 DIAGNOSIS — E669 Obesity, unspecified: Secondary | ICD-10-CM | POA: Insufficient documentation

## 2017-01-17 HISTORY — PX: DILATATION & CURETTAGE/HYSTEROSCOPY WITH MYOSURE: SHX6511

## 2017-01-17 SURGERY — DILATATION & CURETTAGE/HYSTEROSCOPY WITH MYOSURE
Anesthesia: General | Site: Uterus

## 2017-01-17 MED ORDER — KETOROLAC TROMETHAMINE 30 MG/ML IJ SOLN
INTRAMUSCULAR | Status: DC | PRN
  Administered 2017-01-17: 30 mg via INTRAMUSCULAR
  Administered 2017-01-17: 30 mg via INTRAVENOUS

## 2017-01-17 MED ORDER — ONDANSETRON HCL 4 MG/2ML IJ SOLN
INTRAMUSCULAR | Status: DC | PRN
  Administered 2017-01-17: 4 mg via INTRAVENOUS

## 2017-01-17 MED ORDER — DEXAMETHASONE SODIUM PHOSPHATE 4 MG/ML IJ SOLN
INTRAMUSCULAR | Status: DC | PRN
  Administered 2017-01-17: 4 mg via INTRAVENOUS

## 2017-01-17 MED ORDER — SCOPOLAMINE 1 MG/3DAYS TD PT72
MEDICATED_PATCH | TRANSDERMAL | Status: AC
  Administered 2017-01-17: 1.5 mg via TRANSDERMAL
  Filled 2017-01-17: qty 1

## 2017-01-17 MED ORDER — FENTANYL CITRATE (PF) 100 MCG/2ML IJ SOLN
25.0000 ug | INTRAMUSCULAR | Status: DC | PRN
  Administered 2017-01-17: 25 ug via INTRAVENOUS

## 2017-01-17 MED ORDER — MIDAZOLAM HCL 2 MG/2ML IJ SOLN
INTRAMUSCULAR | Status: AC
  Filled 2017-01-17: qty 2

## 2017-01-17 MED ORDER — MEPERIDINE HCL 25 MG/ML IJ SOLN: 6.2500 mg | INTRAMUSCULAR | Status: DC | PRN

## 2017-01-17 MED ORDER — EPHEDRINE 5 MG/ML INJ
INTRAVENOUS | Status: AC
  Filled 2017-01-17: qty 10

## 2017-01-17 MED ORDER — PROPOFOL 10 MG/ML IV BOLUS
INTRAVENOUS | Status: DC | PRN
  Administered 2017-01-17: 200 mg via INTRAVENOUS
  Administered 2017-01-17 (×11): 10 mg via INTRAVENOUS

## 2017-01-17 MED ORDER — ACETAMINOPHEN 325 MG PO TABS: 325.0000 mg | ORAL_TABLET | ORAL | Status: DC | PRN

## 2017-01-17 MED ORDER — OXYCODONE HCL 5 MG PO TABS: 5.0000 mg | ORAL_TABLET | Freq: Once | ORAL | Status: DC | PRN

## 2017-01-17 MED ORDER — FENTANYL CITRATE (PF) 100 MCG/2ML IJ SOLN
INTRAMUSCULAR | Status: DC | PRN
  Administered 2017-01-17 (×2): 50 ug via INTRAVENOUS

## 2017-01-17 MED ORDER — SODIUM CHLORIDE 0.9 % IR SOLN
Status: DC | PRN
  Administered 2017-01-17: 3000 mL

## 2017-01-17 MED ORDER — KETOROLAC TROMETHAMINE 30 MG/ML IJ SOLN: 30.0000 mg | Freq: Once | INTRAMUSCULAR | Status: DC

## 2017-01-17 MED ORDER — DEXAMETHASONE SODIUM PHOSPHATE 4 MG/ML IJ SOLN
INTRAMUSCULAR | Status: AC
  Filled 2017-01-17: qty 1

## 2017-01-17 MED ORDER — PROPOFOL 10 MG/ML IV BOLUS
INTRAVENOUS | Status: AC
  Filled 2017-01-17: qty 20

## 2017-01-17 MED ORDER — LACTATED RINGERS IV SOLN
INTRAVENOUS | Status: DC
  Administered 2017-01-17: 12:00:00 via INTRAVENOUS

## 2017-01-17 MED ORDER — KETOROLAC TROMETHAMINE 30 MG/ML IJ SOLN
INTRAMUSCULAR | Status: AC
  Filled 2017-01-17: qty 2

## 2017-01-17 MED ORDER — FENTANYL CITRATE (PF) 100 MCG/2ML IJ SOLN
INTRAMUSCULAR | Status: AC
  Filled 2017-01-17: qty 2

## 2017-01-17 MED ORDER — MIDAZOLAM HCL 5 MG/5ML IJ SOLN
INTRAMUSCULAR | Status: DC | PRN
  Administered 2017-01-17 (×2): 1 mg via INTRAVENOUS

## 2017-01-17 MED ORDER — ACETAMINOPHEN 160 MG/5ML PO SOLN: 325.0000 mg | ORAL | Status: DC | PRN

## 2017-01-17 MED ORDER — LIDOCAINE HCL (CARDIAC) 20 MG/ML IV SOLN
INTRAVENOUS | Status: DC | PRN
  Administered 2017-01-17: 100 mg via INTRAVENOUS

## 2017-01-17 MED ORDER — FENTANYL CITRATE (PF) 100 MCG/2ML IJ SOLN
INTRAMUSCULAR | Status: DC
Start: 2017-01-17 — End: 2017-01-17
  Filled 2017-01-17: qty 2

## 2017-01-17 MED ORDER — LIDOCAINE HCL (CARDIAC) 20 MG/ML IV SOLN
INTRAVENOUS | Status: AC
  Filled 2017-01-17: qty 5

## 2017-01-17 MED ORDER — ONDANSETRON HCL 4 MG/2ML IJ SOLN: 4.0000 mg | Freq: Once | INTRAMUSCULAR | Status: DC | PRN

## 2017-01-17 MED ORDER — SCOPOLAMINE 1 MG/3DAYS TD PT72
1.0000 | MEDICATED_PATCH | Freq: Once | TRANSDERMAL | Status: DC
  Administered 2017-01-17: 1.5 mg via TRANSDERMAL

## 2017-01-17 MED ORDER — ONDANSETRON HCL 4 MG/2ML IJ SOLN
INTRAMUSCULAR | Status: AC
  Filled 2017-01-17: qty 2

## 2017-01-17 MED ORDER — PHENYLEPHRINE 40 MCG/ML (10ML) SYRINGE FOR IV PUSH (FOR BLOOD PRESSURE SUPPORT)
PREFILLED_SYRINGE | INTRAVENOUS | Status: AC
  Filled 2017-01-17: qty 10

## 2017-01-17 MED ORDER — OXYCODONE HCL 5 MG/5ML PO SOLN: 5.0000 mg | Freq: Once | ORAL | Status: DC | PRN

## 2017-01-17 SURGICAL SUPPLY — 20 items
CANISTER SUCT 3000ML (MISCELLANEOUS) ×2 IMPLANT
CATH ROBINSON RED A/P 16FR (CATHETERS) ×2 IMPLANT
CLOTH BEACON ORANGE TIMEOUT ST (SAFETY) ×2 IMPLANT
CONTAINER PREFILL 10% NBF 60ML (FORM) ×4 IMPLANT
DEVICE MYOSURE LITE (MISCELLANEOUS) ×1 IMPLANT
DEVICE MYOSURE REACH (MISCELLANEOUS) IMPLANT
ELECT REM PT RETURN 9FT ADLT (ELECTROSURGICAL) ×2
ELECTRODE REM PT RTRN 9FT ADLT (ELECTROSURGICAL) ×1 IMPLANT
FILTER ARTHROSCOPY CONVERTOR (FILTER) ×2 IMPLANT
GLOVE BIOGEL PI IND STRL 7.0 (GLOVE) ×2 IMPLANT
GLOVE BIOGEL PI INDICATOR 7.0 (GLOVE) ×2
GLOVE ECLIPSE 6.5 STRL STRAW (GLOVE) ×2 IMPLANT
GOWN STRL REUS W/TWL LRG LVL3 (GOWN DISPOSABLE) ×4 IMPLANT
PACK VAGINAL MINOR WOMEN LF (CUSTOM PROCEDURE TRAY) ×2 IMPLANT
PAD OB MATERNITY 4.3X12.25 (PERSONAL CARE ITEMS) ×2 IMPLANT
SEAL ROD LENS SCOPE MYOSURE (ABLATOR) ×2 IMPLANT
TOWEL OR 17X24 6PK STRL BLUE (TOWEL DISPOSABLE) ×4 IMPLANT
TUBING AQUILEX INFLOW (TUBING) ×2 IMPLANT
TUBING AQUILEX OUTFLOW (TUBING) ×2 IMPLANT
WATER STERILE IRR 1000ML POUR (IV SOLUTION) ×2 IMPLANT

## 2017-01-17 NOTE — Anesthesia Postprocedure Evaluation (Addendum)
Anesthesia Post Note  Patient: Rebecca [name]  Procedure(s) Performed: Procedure(s) (LRB): DILATATION & CURETTAGE/HYSTEROSCOPY WITH MYOSURE (N/A)  Patient location during evaluation: PACU Anesthesia Type: General Level of consciousness: awake Pain management: pain level controlled Vital Signs Assessment: post-procedure vital signs reviewed and stable Respiratory status: spontaneous breathing Cardiovascular status: stable Postop Assessment: no signs of nausea or vomiting Anesthetic complications: no        Last Vitals:  Vitals:   01/17/17 1430 01/17/17 1445  BP: 132/80 128/78  Pulse: 81 87  Resp: 14 20  Temp:      Last Pain:  Vitals:   01/17/17 1146  TempSrc: Oral   Pain Goal: Patients Stated Pain Goal: 5 (01/17/17 1146)               [place]

## 2017-01-17 NOTE — H&P (Signed)
Rebecca [name] is an 46 y.o. female G67P2 BF presents for dx hysteroscopy, D&C resection of endometrial mass due to  PMB, endometrial mass on sonohysterogram . Pt also had endometrial cells on pap  Pertinent Gynecological History: Menses: post-menopausal Bleeding: post menopausal bleeding Contraception: none DES exposure: denies Blood transfusions: none Sexually transmitted diseases: no past history Previous GYN Procedures: TL  Last mammogram: normal Date: 2017 Last pap: except endometrial cells noted Date: 11/02/2016 OB History: G3, P2   Menstrual History: Menarche age: n/a Patient's last menstrual period was 08/13/2013.    Past Medical History:  Diagnosis Date  . Anemia   . Colon polyp 12/25/2011   Tubular adenomatous  . Depression    following death of mother  . Duodenitis   . DVT (deep vein thrombosis) in pregnancy ([place])   . Esophagitis   . Fatigue   . Gallstones   . GERD (gastroesophageal reflux disease)   . Infection    UTI  . Neuromuscular disorder ([place])    hiatal hernia  . Obesity   . PUD (peptic ulcer disease)   . Sacroiliac pain   . Vaginal Pap smear, abnormal    colpo, results ok  . Vitamin D deficiency     Past Surgical History:  Procedure Laterality Date  . BREAST LUMPECTOMY Left    benign  . COLPOSCOPY  2012   NEG  . TUBAL LIGATION      Family History  Problem Relation Age of Onset  . Aneurysm Mother   . Heart disease Mother   . Hypertension Mother   . Stroke Mother   . Lupus Mother   . Diabetes Maternal Aunt   . Cancer Maternal Aunt   . Colon cancer Neg Hx     Social History:  reports that she has been smoking Cigarettes.  She has a 0.50 pack-year smoking history. She has never used smokeless tobacco. She reports that she drinks alcohol. She reports that she does not use drugs.  Allergies: No Known Allergies  Prescriptions Prior to Admission  Medication Sig Dispense Refill Last Dose  . buPROPion (WELLBUTRIN XL) 150 MG 24 hr tablet  TAKE 1 TABLET BY MOUTH EVERY MORNING 90 tablet 1   . DULoxetine (CYMBALTA) 60 MG capsule Take 1 capsule (60 mg total) by mouth daily. 90 capsule 1 01/17/2017 at Unknown time  . ibuprofen (ADVIL,MOTRIN) 200 MG tablet Take 600 mg by mouth every 6 (six) hours as needed for headache or moderate pain.   Past Week at Unknown time  . metoprolol succinate (TOPROL XL) 25 MG 24 hr tablet Take 1 tablet (25 mg total) by mouth daily. 90 tablet 3 01/17/2017 at Unknown time  . Multiple Vitamin (MULTIVITAMIN WITH MINERALS) TABS tablet Take 1 tablet by mouth daily.   01/14/2017 at Unknown time  . ALPRAZolam (XANAX) 0.5 MG tablet Take 1 tablet (0.5 mg total) by mouth 3 (three) times daily as needed for sleep or anxiety. 60 tablet 0 More than a month at Unknown time  . BIOTIN PO Take 1 capsule by mouth daily.   More than a month at Unknown time  . traMADol (ULTRAM) 50 MG tablet Take 1 tablet (50 mg total) by mouth every 8 (eight) hours. (Patient taking differently: Take 50 mg by mouth every 8 (eight) hours as needed for moderate pain. ) 40 tablet 0 Unknown at Unknown time    Review of Systems  All other systems reviewed and are negative.   Blood pressure 119/77, pulse 80, temperature  97.7 F (36.5 C), temperature source Oral, resp. rate 18, last menstrual period 08/13/2013, SpO2 98 %. Physical Exam  Constitutional: She is oriented to person, place, and time. She appears well-developed and well-nourished.  Eyes: EOM are normal.  Neck: Neck supple.  Cardiovascular: Regular rhythm.   Respiratory: Breath sounds normal.  GI: Soft.  Musculoskeletal: Normal range of motion.  Neurological: She is alert and oriented to person, place, and time.  Skin: Skin is warm and dry.  vulva nl Vagina nl cervix parous Uterus sl enlarged  No results found for this or any previous visit (from the past 24 hour(s)).  No results found.  Assessment/Plan: PMB  endometrial thickening on sonogram SM fibroid Endometrial cells on  pap P) dx hysteroscopy, D&C, resection of endom mass. Risk of surgery reviewed including infection, bleeding, injury to surrounding Organ sructures, fluid overload and its risk , thermal injury. Poss blood transfusion and inability to complete resection. ALL ? answered  Rebecca [name] A 01/17/2017, 12:54 PM

## 2017-01-17 NOTE — Transfer of Care (Signed)
Immediate Anesthesia Transfer of Care Note  Patient: Rebecca [name]  Procedure(s) Performed: Procedure(s): DILATATION & CURETTAGE/HYSTEROSCOPY WITH MYOSURE (N/A)  Patient Location: PACU  Anesthesia Type:General  Level of Consciousness: awake, alert , oriented and patient cooperative  Airway & Oxygen Therapy: Patient Spontanous Breathing and Patient connected to nasal cannula oxygen  Post-op Assessment: Report given to RN and Post -op Vital signs reviewed and stable  Post vital signs: Reviewed and stable  Last Vitals:  Vitals:   01/17/17 1357 01/17/17 1400  BP:  130/82  Pulse: 85 86  Resp:  16  Temp: 36.4 C     Last Pain:  Vitals:   01/17/17 1146  TempSrc: Oral      Patients Stated Pain Goal: 5 ([id number])  Complications: No apparent anesthesia complications

## 2017-01-17 NOTE — Anesthesia Procedure Notes (Signed)
Procedure Name: LMA Insertion Date/Time: 01/17/2017 1:16 PM Performed by: Adalberto Ill Pre-anesthesia Checklist: Patient identified, Emergency Drugs available, Suction available, Patient being monitored and Timeout performed Patient Re-evaluated:Patient Re-evaluated prior to inductionOxygen Delivery Method: Circle system utilized Preoxygenation: Pre-oxygenation with 100% oxygen Intubation Type: IV induction LMA: LMA inserted LMA Size: 4.0 Number of attempts: 1 Placement Confirmation: ETT inserted through vocal cords under direct vision,  positive ETCO2 and breath sounds checked- equal and bilateral ETT to lip (cm): yes. Tube secured with: Tape Dental Injury: Teeth and Oropharynx as per pre-operative assessment

## 2017-01-17 NOTE — Discharge Instructions (Signed)
General Anesthesia, Adult, Care After These instructions provide you with information about caring for yourself after your procedure. Your health care provider may also give you more specific instructions. Your treatment has been planned according to current medical practices, but problems sometimes occur. Call your health care provider if you have any problems or questions after your procedure. What can I expect after the procedure? After the procedure, it is common to have:  Vomiting.  A sore throat.  Mental slowness. It is common to feel:  Nauseous.  Cold or shivery.  Sleepy.  Tired.  Sore or achy, even in parts of your body where you did not have surgery. Follow these instructions at home: For at least 24 hours after the procedure:  Do not:  Participate in activities where you could fall or become injured.  Drive.  Use heavy machinery.  Drink alcohol.  Take sleeping pills or medicines that cause drowsiness.  Make important decisions or sign legal documents.  Take care of children on your own.  Rest. Eating and drinking  If you vomit, drink water, juice, or soup when you can drink without vomiting.  Drink enough fluid to keep your urine clear or pale yellow.  Make sure you have little or no nausea before eating solid foods.  Follow the diet recommended by your health care provider. General instructions  Have a responsible adult stay with you until you are awake and alert.  Return to your normal activities as told by your health care provider. Ask your health care provider what activities are safe for you.  Take over-the-counter and prescription medicines only as told by your health care provider.  If you smoke, do not smoke without supervision.  Keep all follow-up visits as told by your health care provider. This is important. Contact a health care provider if:  You continue to have nausea or vomiting at home, and medicines are not helpful.  You  cannot drink fluids or start eating again.  You cannot urinate after 8-12 hours.  You develop a skin rash.  You have fever.  You have increasing redness at the site of your procedure. Get help right away if:  You have difficulty breathing.  You have chest pain.  You have unexpected bleeding.  You feel that you are having a life-threatening or urgent problem. This information is not intended to replace advice given to you by your health care provider. Make sure you discuss any questions you have with your health care provider. Document Released: 02/19/2001 Document Revised: 04/17/2016 Document Reviewed: 10/28/2015 Elsevier Interactive Patient Education  2017 [place]. Hysteroscopy, Care After Refer to this sheet in the next few weeks. These instructions provide you with information on caring for yourself after your procedure. Your health care provider may also give you more specific instructions. Your treatment has been planned according to current medical practices, but problems sometimes occur. Call your health care provider if you have any problems or questions after your procedure.  WHAT TO EXPECT AFTER THE PROCEDURE After your procedure, it is typical to have the following:  You may have some cramping. This normally lasts for a couple days.  You may have bleeding. This can vary from light spotting for a few days to menstrual-like bleeding for 3-7 days. HOME CARE INSTRUCTIONS  Rest for the first 1-2 days after the procedure.  Only take over-the-counter or prescription medicines as directed by your health care provider. Do not take aspirin. It can increase the chances of bleeding.  Take  showers instead of baths for 2 weeks or as directed by your health care provider.  Do not drive for 24 hours or as directed.  Do not drink alcohol while taking pain medicine.  Do not use tampons, douche, or have sexual intercourse for 2 weeks or until your health care provider says it  is okay.  Take your temperature twice a day for 4-5 days. Write it down each time.  Follow your health care provider's advice about diet, exercise, and lifting.  If you develop constipation, you may:  Take a mild laxative if your health care provider approves.  Add bran foods to your diet.  Drink enough fluids to keep your urine clear or pale yellow.  Try to have someone with you or available to you for the first 24-48 hours, especially if you were given a general anesthetic.  Follow up with your health care provider as directed. SEEK MEDICAL CARE IF:  You feel dizzy or lightheaded.  You feel sick to your stomach (nauseous).  You have abnormal vaginal discharge.  You have a rash.  You have pain that is not controlled with medicine. SEEK IMMEDIATE MEDICAL CARE IF:  You have bleeding that is heavier than a normal menstrual period.  You have a fever.  You have increasing cramps or pain, not controlled with medicine.  You have new belly (abdominal) pain.  You pass out.  You have pain in the tops of your shoulders (shoulder strap areas).  You have shortness of breath. This information is not intended to replace advice given to you by your health care provider. Make sure you discuss any questions you have with your health care provider. Document Released: 09/03/2013 Document Reviewed: 09/03/2013 Elsevier Interactive Patient Education  2017 [place]  IF TEMP>100.4, NOTHING PER VAGINA X 1 WK, CALL IF SOAKING A MAXI  PAD EVERY HOUR OR MORE FREQUENTLY

## 2017-01-17 NOTE — Brief Op Note (Signed)
01/17/2017  2:02 PM  PATIENT:  Melene Plan  46 y.o. female  PRE-OPERATIVE DIAGNOSIS:  Postmenopausal Bleeding, endometrial cells on pap, SM fibroid  POST-OPERATIVE DIAGNOSIS:  postmenopausal bleeding, endometrial cells on pap  PROCEDURE:  Diagnostic hysteroscopy, D&C, hysteroscopic resection of endometrium  SURGEON:  Surgeon(s) and Role:    * Servando Salina, MD - Primary  PHYSICIAN ASSISTANT:   ASSISTANTS: none   ANESTHESIA:   general  EBL:  Total I/O In: -  Out: 22 [Urine:20; Blood:2]  BLOOD ADMINISTERED:none  DRAINS: none   LOCAL MEDICATIONS USED:  NONE  SPECIMEN:  Source of Specimen:  [facility]  DISPOSITION OF SPECIMEN:  PATHOLOGY  COUNTS:  YES  TOURNIQUET:  * No tourniquets in log *  DICTATION: .Other Dictation: Dictation Number [id number]  PLAN OF CARE: Discharge to home after PACU  PATIENT DISPOSITION:  PACU - hemodynamically stable.   Delay start of Pharmacological VTE agent (>24hrs) due to surgical blood loss or risk of bleeding: no

## 2017-01-17 NOTE — Anesthesia Preprocedure Evaluation (Signed)
Anesthesia Evaluation  Patient identified by MRN, date of birth, ID band Patient awake    Reviewed: Allergy & Precautions, NPO status , Patient's Chart, lab work & pertinent test results  Airway Mallampati: I       Dental no notable dental hx.    Pulmonary Current Smoker,    Pulmonary exam normal        Cardiovascular hypertension, Pt. on home beta blockers Normal cardiovascular exam     Neuro/Psych    GI/Hepatic   Endo/Other    Renal/GU      Musculoskeletal   Abdominal (+) + obese,   Peds  Hematology   Anesthesia Other Findings   Reproductive/Obstetrics                             Anesthesia Physical Anesthesia Plan  ASA: II  Anesthesia Plan: General   Post-op Pain Management:    Induction: Intravenous  Airway Management Planned: Double Lumen EBT  Additional Equipment:   Intra-op Plan:   Post-operative Plan:   Informed Consent: I have reviewed the patients History and Physical, chart, labs and discussed the procedure including the risks, benefits and alternatives for the proposed anesthesia with the patient or authorized representative who has indicated his/her understanding and acceptance.     Plan Discussed with: CRNA and Surgeon  Anesthesia Plan Comments:         Anesthesia Quick Evaluation

## 2017-01-18 ENCOUNTER — Encounter (HOSPITAL_COMMUNITY): Payer: Self-pay | Admitting: Obstetrics and Gynecology

## 2017-01-18 NOTE — Op Note (Signed)
Rebecca [name], CAMM NO.:  [id number]  MEDICAL RECORD NO.:  [id number]  LOCATION:                                 FACILITY:  PHYSICIAN:  Servando Salina, M.D.DATE OF BIRTH:  03/25/1971  DATE OF PROCEDURE:  01/17/2017 DATE OF DISCHARGE:                              OPERATIVE REPORT   PREOPERATIVE DIAGNOSES: 1. Postmenopausal bleeding. 2. Submucosal fibroid. 3. Endometrial cells on Pap.  PROCEDURE: 1. Diagnostic hysteroscopy. 2. Hysteroscopic resection using MyoSure. 3. Dilation and curettage.  POSTOPERATIVE DIAGNOSES: 1. Postmenopausal bleeding. 2. Endometrial cells on Pap.  ANESTHESIA:  General.  SURGEON:  Servando Salina, M.D.  ASSISTANT:  None.  DESCRIPTION OF PROCEDURE:  Under adequate general anesthesia, the patient was placed in a dorsal lithotomy position.  She was sterilely prepped and draped in the usual fashion.  The bladder put out small amount of urine.  Examination under anesthesia revealed an anteflexed uterus.  No adnexal masses could be appreciated.  A bivalve speculum was placed in vagina.  Single-tooth tenaculum was placed on anterior lip of the cervix.  The cervix was then serially dilated up to #23 [facility] dilator.  A MyoSure hysteroscope was inserted into the uterine cavity. Both tubal ostia could be seen.  Atrophic like endometrium was noted. No discrete mass was actually seen.  Using the Lite myosure resectoscope, the endometrium was resected throughout.  The instrument was then removed. The cavity was then curetted for scant amount of tissue.  All instruments were then removed from the vagina.  SPECIMEN:  Specimen labeled endometrial curetting was sent to Pathology.  ESTIMATED BLOOD LOSS:  Minimal.  COMPLICATIONS:  None.  The patient tolerated the procedure well, was transferred to recovery room in stable condition.     Servando Salina, M.D.   ______________________________ Servando Salina,  M.D.    [place]/MEDQ  D:  01/17/2017  T:  01/17/2017  Job:  [id number]

## 2017-04-02 MED FILL — DULoxetine HCL 60 MG CPEP: 60 | 90 days supply | Qty: 90 | Fill #1

## 2017-04-11 ENCOUNTER — Ambulatory Visit: Payer: Self-pay | Admitting: Internal Medicine

## 2017-04-27 MED FILL — BUPROPION HCL XL 150 MG TAB: 150 | 90 days supply | Qty: 90 | Fill #0

## 2017-04-27 MED FILL — METOPROLOL SUCC ER 25 MG TA: 25 | 90 days supply | Qty: 90 | Fill #0

## 2017-05-04 NOTE — Addendum Note (Signed)
Addendum  created 05/04/17 1029 by Lyn Hollingshead, MD   Sign clinical note

## 2017-06-06 ENCOUNTER — Encounter: Payer: Self-pay | Admitting: Internal Medicine

## 2017-07-10 MED FILL — DULoxetine HCL 60 MG CPEP: 60 | 90 days supply | Qty: 90 | Fill #0 | Status: TO

## 2017-08-17 ENCOUNTER — Ambulatory Visit: Payer: Self-pay | Admitting: Podiatry

## 2017-08-17 MED FILL — METOPROLOL SUCC ER 25 MG TA: 25 | 30 days supply | Qty: 30 | Fill #1

## 2017-08-31 ENCOUNTER — Ambulatory Visit: Payer: Self-pay | Admitting: Podiatry

## 2017-09-10 ENCOUNTER — Other Ambulatory Visit: Payer: Self-pay

## 2017-09-10 ENCOUNTER — Other Ambulatory Visit: Payer: Self-pay | Admitting: Internal Medicine

## 2017-09-10 MED ORDER — BUPROPION HCL ER (XL) 150 MG PO TB24: 150.0000 mg | ORAL_TABLET | Freq: Every morning | ORAL | 0 refills | Status: DC

## 2017-09-10 MED FILL — BUPROPION HCL XL 150 MG TAB: 150 | 30 days supply | Qty: 30 | Fill #0

## 2017-09-11 MED FILL — metroNIDAZOLE 500 MG TABS: 500 | 7 days supply | Qty: 14 | Fill #0

## 2017-09-27 ENCOUNTER — Encounter: Payer: Self-pay | Admitting: Physician Assistant

## 2017-09-27 ENCOUNTER — Ambulatory Visit (INDEPENDENT_AMBULATORY_CARE_PROVIDER_SITE_OTHER): Payer: 59 | Admitting: Physician Assistant

## 2017-09-27 ENCOUNTER — Encounter: Payer: Self-pay | Admitting: Internal Medicine

## 2017-09-27 VITALS — BP 128/86 | HR 84 | Temp 97.6°F | Resp 18 | Ht 66.5 in | Wt 232.0 lb

## 2017-09-27 DIAGNOSIS — Z0001 Encounter for general adult medical examination with abnormal findings: Secondary | ICD-10-CM | POA: Diagnosis not present

## 2017-09-27 DIAGNOSIS — E559 Vitamin D deficiency, unspecified: Secondary | ICD-10-CM

## 2017-09-27 DIAGNOSIS — D126 Benign neoplasm of colon, unspecified: Secondary | ICD-10-CM

## 2017-09-27 DIAGNOSIS — K219 Gastro-esophageal reflux disease without esophagitis: Secondary | ICD-10-CM

## 2017-09-27 DIAGNOSIS — R6889 Other general symptoms and signs: Secondary | ICD-10-CM

## 2017-09-27 DIAGNOSIS — R7303 Prediabetes: Secondary | ICD-10-CM | POA: Diagnosis not present

## 2017-09-27 DIAGNOSIS — F3341 Major depressive disorder, recurrent, in partial remission: Secondary | ICD-10-CM

## 2017-09-27 DIAGNOSIS — E782 Mixed hyperlipidemia: Secondary | ICD-10-CM | POA: Diagnosis not present

## 2017-09-27 DIAGNOSIS — Z130 Encounter for screening for diseases of the blood and blood-forming organs and certain disorders involving the immune mechanism: Secondary | ICD-10-CM

## 2017-09-27 DIAGNOSIS — B353 Tinea pedis: Secondary | ICD-10-CM

## 2017-09-27 DIAGNOSIS — K5909 Other constipation: Secondary | ICD-10-CM | POA: Diagnosis not present

## 2017-09-27 DIAGNOSIS — Z6836 Body mass index (BMI) 36.0-36.9, adult: Secondary | ICD-10-CM | POA: Diagnosis not present

## 2017-09-27 DIAGNOSIS — Z79899 Other long term (current) drug therapy: Secondary | ICD-10-CM

## 2017-09-27 DIAGNOSIS — K8020 Calculus of gallbladder without cholecystitis without obstruction: Secondary | ICD-10-CM

## 2017-09-27 DIAGNOSIS — I10 Essential (primary) hypertension: Secondary | ICD-10-CM

## 2017-09-27 MED ORDER — TERBINAFINE HCL 250 MG PO TABS: 250.0000 mg | ORAL_TABLET | Freq: Every day | ORAL | 0 refills | Status: DC

## 2017-09-27 MED ORDER — KETOCONAZOLE 2 % EX CREA: 1.0000 "application " | TOPICAL_CREAM | Freq: Two times a day (BID) | CUTANEOUS | 2 refills | Status: DC

## 2017-09-27 MED ORDER — PHENTERMINE HCL 37.5 MG PO TABS: 37.5000 mg | ORAL_TABLET | Freq: Every day | ORAL | 2 refills | Status: DC

## 2017-09-27 MED FILL — TERBINAFINE HCL 250 MG TABS: 250 | 30 days supply | Qty: 30 | Fill #0

## 2017-09-27 MED FILL — KETOCONAZOLE 2% CREAM: 2 | 7 days supply | Qty: 30 | Fill #0

## 2017-09-27 NOTE — Patient Instructions (Signed)
Stop the wellbutrin  Okay I will send in lamisil for you to take. You can only get it from [facility] or [employer] will not cover it.The lamisil is taken once a day for 3 months and then if can be taken for several months after for a month on it and month off it. It gets processed through you liver so we need to check your liver function at 6 weeks after taking the drug.  Phentermine  While taking the medication we may ask that you come into the office once a month or once every 2-3 months to monitor your weight, blood pressure, and heart rate. In addition we can help answer your questions about diet, exercise, and help you every step of the way with your weight loss journey. Sometime it is helpful if you bring in a food diary or use an app on your phone such as myfitnesspal to record your calorie intake, especially in the beginning.   You can start out on 1/3 to 1/2 a pill in the morning and if you are tolerating it well you can increase to one pill daily. I also have some patients that take 1/3 or 1/2 at lunch to help prevent night time eating.  This medication is cheapest CASH pay at [facility] is 14-17 dollars and you do NOT need a membership to get meds from there.    What is this medicine? PHENTERMINE (FEN ter meen) decreases your appetite. This medicine is intended to be used in addition to a healthy reduced calorie diet and exercise. The best results are achieved this way. This medicine is only indicated for short-term use. Eventually your weight loss may level out and the medication will no longer be needed.   How should I use this medicine? Take this medicine by mouth. Follow the directions on the prescription label. The tablets should stay in the bottle until immediately before you take your dose. Take your doses at regular intervals. Do not take your medicine more often than directed.  Overdosage: If you think you have taken too much of this  medicine contact a poison control center or emergency room at once. NOTE: This medicine is only for you. Do not share this medicine with others.  What if I miss a dose? If you miss a dose, take it as soon as you can. If it is almost time for your next dose, take only that dose. Do not take double or extra doses. Do not increase or in any way change your dose without consulting your doctor.  What should I watch for while using this medicine? Notify your physician immediately if you become short of breath while doing your normal activities. Do not take this medicine within 6 hours of bedtime. It can keep you from getting to sleep. Avoid drinks that contain caffeine and try to stick to a regular bedtime every night. Do not stand or sit up quickly, especially if you are an older patient. This reduces the risk of dizzy or fainting spells. Avoid alcoholic drinks.  What side effects may I notice from receiving this medicine? Side effects that you should report to your doctor or health care professional as soon as possible: -chest pain, palpitations -depression or severe changes in mood -increased blood pressure -irritability -nervousness or restlessness -severe dizziness -shortness of breath -problems urinating -unusual swelling of the legs -vomiting  Side effects that usually do not require medical attention (report to your doctor or health  care professional if they continue or are bothersome): -blurred vision or other eye problems -changes in sexual ability or desire -constipation or diarrhea -difficulty sleeping -dry mouth or unpleasant taste -headache -nausea This list may not describe all possible side effects. Call your doctor for medical advice about side effects. You may report side effects to FDA at 1-800-FDA-1088.   Who Qualifies for Obesity Medications? Although everyone is hopeful for a fast and easy way to lose weight, nothing has been shown to replace a prudent,  calorie-controlled diet along with behavior modification as a cornerstone for all obesity treatments.  The next tool that can be used to achieve weight-loss and health improvement is medication. Pharmacotherapy may be offered to individuals affected by obesity who have failed to achieve weight-loss through diet and exercise alone. Currently there are several drugs that are approved by the FDA for weight-loss: phentermine products (Adipex-P or Suprenza)  lorcaserin HCI (Belviq) phentermine- topiramate ER (Qsymia)  Bupropion; Naltrexone ER (Contrave)  Saxenda (liraglutide), once a day weight loss shot Let's take a closer look at each of these medications and learn how they work:  Phentermine (Adipex-P or Suprenza) How does it work? Phentermine is a medication available by prescription that works on chemicals in the brain to decrease your appetite. It also has a mild stimulant component that adds extra energy. Phentermine is a pill that is taken once a day in the morning time. Tolerance to this medication can develop, so it can only be used for several months at a time. Common side effects are dry mouth, sleeplessness, constipation. Weight-loss: The average weight-loss is 4-5 percent of your weight after one-year. In a 200 pound person, this means about 10 pounds of weight-loss. Patients who receive phentermine can usually expect to see greater weight-loss than those who receive non-pharmacologic care, on average about 13 pounds difference over 12 weeks as reported in one study. Concerns: Due to its stimulant effect, a person's blood pressure and heart rate may increase when on this medication; therefore, you must be monitored closely by a physician who is experienced in prescribing this medication. It cannot be used in patients with some heart conditions (such as poorly controlled blood pressure), glaucoma (increased pressure in your eye), stroke or overactive thyroid. There is some concern for  abuse, but this is minimal if the medication is appropriately used as directed by a healthcare professional.  Lorcaserin (Belviq) How does it work? Lorcaserin was approved in June 2012 by the FDA and became commercially available in June 2013. It works by helping you feel full while eating less, and it works on the chemicals in your brain to help decrease your appetite. Weight-loss: In individuals who took the medication for one-year, it has been shown to have an average of 7 percent weight-loss. In a 200 pound person, this would mean a 14 pound weight-loss. Blood sugar, cholesterol and blood pressure levels have also been shown to improve. Concerns: The most common side effects are headache, dizziness, fatigue, dry mouth, upper JSE:GBTDVVOH tract infection and nausea.  Response to therapy should be evaluated by week 12.  If a patient has not lost at least 5% of baseline body weigh  Phentermine-Topiramate ER (Qsymia) How does it work? This combination medication was approved by the FDA in July 2012. Topiramate is a medication used to treat seizures. It was found that a common side effect of this medication was weight-loss. Phentermine, as described in this brochure, helps to increase your energy and decrease your appetite. Weight-loss:  Among individuals who took the highest does of Qsymia (15 mg phentermine and 92 mg of topiramate ER) for one-year, they achieved an average of 14.4 percent weight-loss. In a 200 pound person, a 14.4 percent weight-loss would mean a loss of 29 pounds. Cholesterol levels have also been shown to improve. Concerns: The most common side effects were dry mouth, constipation and pins-and-needle feeling in extremities. Qsymia should NOT be taken during pregnancy since Topiramate ER, a component of Qsymia, has been associated with an increased risk of birth defects.  Bupropion; Naltrexone ER (Contrave) How does it work? Works in two areas of your brain, hunger center and  reward center to reduce hunger and cravings.  Weight loss In a 56 week trial patients lost more than 5% of their body weight.  Concerns Most common side effects are dry mouth, constipation or diarrhea, headache.  Please take it with a full glass of water and low fat meal.   LETS WORK ON MEAL PREP  Drink 80-100 oz a day of water, measure it out Eat 3 meals a day, have to do breakfast, eat protein- hard boiled eggs, protein bar like [place] protein bar, greek yogurt like oikos triple zero, chobani 100, or light n fit greek   Follow-up Visits: Patients are given the opportunity to revisit a topic or obtain more information on an area of interest during follow-up visits. The frequency of and interval between follow-up visits is determined on a patient-by-patient basis. Frequent visits (every 3 to 4 weeks) are encouraged until initial weight-loss goals (5 to 10 percent of body weight) are achieved. At that point, less frequent visits are typically scheduled as needed for individual patients. However, since obesity is considered a chronic life-long problem for many individuals, periodic continual follow up is recommended.   Research has shown that weight-loss as low as 5 percent of initial body weight can lead to favorable improvements in blood pressure, cholesterol, glucose levels and insulin sensitivity. The risk of developing heart disease is reduced the most in patients who have impaired glucose tolerance, type 2 diabetes or high blood pressure.     Simple math prevails.    1st - exercise does not produce significant weight loss - at best one converts fat into muscle , "bulks up", loses inches, but usually stays "weight neutral"     2nd - think of your body weightas a check book: If you eat more calories than you burn up - you save money or gain weight .... Or if you spend more money than you put in the check book, ie burn up more calories than you eat, then you lose weight     3rd -  if you walk or run 1 mile, you burn up 100 calories - you have to burn up 3,500 calories to lose 1 pound, ie you have to walk/run 35 miles to lose 1 measly pound. So if you want to lose 10 #, then you have to walk/run 350 miles, so.... clearly exercise is not the solution.     4. So if you consume 1,500 calories, then you have to burn up the equivalent of 15 miles to stay weight neutral - It also stands to reason that if you consume 1,500 cal/day and don't lose weight, then you must be burning up about 1,500 cals/day to stay weight neutral.     5. If you really want to lose weight, you must cut your calorie intake 300 calories /day and at that rate you  should lose about 1 # every 3 days.   6. Please purchase Dr Fara Olden Fuhrman's book(s) "The End of Dieting" & "Eat to Live" . It has some great concepts and recipes.      We want weight loss that will last so you should lose 1-2 pounds a week.  THAT IS IT! Please pick THREE things a month to change. Once it is a habit check off the item. Then pick another three items off the list to become habits.  If you are already doing a habit on the list GREAT!  Cross that item off! o Don't drink your calories. Ie, alcohol, soda, fruit juice, and sweet tea.  o Drink more water. Drink a glass when you feel hungry or before each meal.  o Eat breakfast - Complex carb and protein (likeDannon light and fit yogurt, oatmeal, fruit, eggs, [place] bacon). o Measure your cereal.  Eat no more than one cup a day. (ie [place]) o Eat an apple a day. o Add a vegetable a day. o Try a new vegetable a month. o Use Pam! Stop using oil or butter to cook. o Don't finish your plate or use smaller plates. o Share your dessert. o Eat sugar free Jello for dessert or frozen grapes. o Don't eat 2-3 hours before bed. o Switch to whole wheat bread, pasta, and brown rice. o Make healthier choices when you eat out. No fries! o Pick baked chicken, NOT fried. o Don't forget to SLOW DOWN when  you eat. It is not going anywhere.  o Take the stairs. o Park far away in the parking lot o [employer] (or weights) for 10 minutes while watching TV. o Walk at work for 10 minutes during break. o Walk outside 1 time a week with your friend, kids, dog, or significant other. o Start a walking group at [place] the mall as much as you can tolerate.  o Keep a food diary. o Weigh yourself daily. o Walk for 15 minutes 3 days per week. o Cook at home more often and eat out less.  If life happens and you go back to old habits, it is okay.  Just start over. You can do it!   If you experience chest pain, get short of breath, or tired during the exercise, please stop immediately and inform your doctor.

## 2017-09-27 NOTE — Progress Notes (Signed)
Complete Physical  Assessment and Plan:  Essential hypertension - continue medications, DASH diet, exercise and monitor at home. Call if greater than 130/80.  -     CBC with Differential/Platelet -     BASIC METABOLIC PANEL WITH GFR -     Hepatic function panel -     TSH -     Urinalysis, Routine w reflex microscopic -     Microalbumin / creatinine urine ratio  Gastroesophageal reflux disease without esophagitis Continue PPI/H2 blocker, diet discussed  Adenomatous polyp of colon, unspecified part of colon  Mixed hyperlipidemia -continue medications, check lipids, decrease fatty foods, increase activity.  -     Lipid panel  Prediabetes Discussed general issues about diabetes pathophysiology and management., Educational material distributed., Suggested low cholesterol diet., Encouraged aerobic exercise., Discussed foot care., Reminded to get yearly retinal exam. -     Hemoglobin A1c  Other constipation Increase fiber, add water  Vitamin D deficiency -     VITAMIN D 25 Hydroxy (Vit-D Deficiency, Fractures)  Depression, major, recurrent, in partial remission ([place]) - continue medications, stress management techniques discussed, increase water, good sleep hygiene discussed, increase exercise, and increase veggies.   BMI 36.0-36.9,adult -     phentermine (ADIPEX-P) 37.5 MG tablet; Take 1 tablet (37.5 mg total) by mouth daily before breakfast.  Calculus of gallbladder without cholecystitis without obstruction Monitor  Class 2 severe obesity due to excess calories with serious comorbidity and body mass index (BMI) of 36.0 to 36.9 in adult (HCC) -     phentermine (ADIPEX-P) 37.5 MG tablet; Take 1 tablet (37.5 mg total) by mouth daily before breakfast. - will try if does not toelrate will try belviq or injectable Meal plan Follow up 6 weeks  Tinea pedis of both feet -     ketoconazole (NIZORAL) 2 % cream; Apply 1 application topically 2 (two) times daily. -     terbinafine  (LAMISIL) 250 MG tablet; Take 1 tablet (250 mg total) by mouth daily. Take one daily for 3 months, need liver function lab at 6 weeks. - if this does not help will refer to podiatry - check 6 week liver function  Medication management -     Magnesium  Screening, anemia, deficiency, iron -     Iron,Total/Total Iron Binding Cap -     Vitamin B12  Discussed med's effects and SE's. Screening labs and tests as requested with regular follow-up as recommended. Over 40 minutes of exam, counseling, chart review, and complex, high level critical decision making was performed this visit.   HPI  46 y.o. AA female  presents for a complete physical and follow up for has PALPITATIONS; GERD (gastroesophageal reflux disease); Constipation; Vitamin D deficiency; Depression, major, recurrent, in partial remission ([place]); Adenomatous colon polyp; Cholelithiasis; Mixed hyperlipidemia; Prediabetes; Obesity; and Essential hypertension on her problem list..  Her blood pressure has been controlled at home, today their BP is BP: 128/86 She does workout, she is walking, x several weeks at lunch, going up and down stairs.  She denies chest pain, shortness of breath, dizziness.   She is on Cymbalta and wellbutrin, states the wellbutrin is not helping with smoking cessation or weight loss.   She has had bilateral foot fungus, worse on left, has tried OTC and RX meds.   She is not on cholesterol medication and denies myalgias. Her cholesterol is at goal. The cholesterol last visit was:   Lab Results  Component Value Date   CHOL 189 09/27/2016  HDL 52 09/27/2016   LDLCALC 119 09/27/2016   TRIG 90 09/27/2016   CHOLHDL 3.6 09/27/2016   She has been working on diet and exercise for prediabetes,   and denies paresthesia of the feet, polydipsia, polyuria and visual disturbances. Last A1C in the office was:  Lab Results  Component Value Date   HGBA1C 5.7 (H) 09/27/2016   Patient is on Vitamin D supplement.   Lab  Results  Component Value Date   VD25OH 28 (L) 09/27/2016     BMI is Body mass index is 36.88 kg/m., she is working on diet and exercise. Wt Readings from Last 3 Encounters:  09/27/17 232 lb (105.2 kg)  01/12/17 225 lb (102.1 kg)  10/31/16 222 lb (100.7 kg)   Breakfast- won't eat breakfast most of the time, just coffee, cream and sugar Lunch- goes out to eat or brings in food Dinner- gets home at 6 pm  Snacks twizzlers, popcorn.  Fluids: water, pop 1 -2 a week. A gallon a day  Current Medications:  Current Outpatient Prescriptions on File Prior to Visit  Medication Sig Dispense Refill  . ALPRAZolam (XANAX) 0.5 MG tablet Take 1 tablet (0.5 mg total) by mouth 3 (three) times daily as needed for sleep or anxiety. 60 tablet 0  . BIOTIN PO Take 1 capsule by mouth daily.    Marland Kitchen buPROPion (WELLBUTRIN XL) 150 MG 24 hr tablet Take 1 tablet (150 mg total) by mouth every morning. 30 tablet 0  . DULoxetine (CYMBALTA) 60 MG capsule Take 1 capsule (60 mg total) by mouth daily. 90 capsule 1  . ibuprofen (ADVIL,MOTRIN) 200 MG tablet Take 600 mg by mouth every 6 (six) hours as needed for headache or moderate pain.    . metoprolol succinate (TOPROL XL) 25 MG 24 hr tablet Take 1 tablet (25 mg total) by mouth daily. 90 tablet 3  . Multiple Vitamin (MULTIVITAMIN WITH MINERALS) TABS tablet Take 1 tablet by mouth daily.     No current facility-administered medications on file prior to visit.    Allergies:  No Known Allergies   Medical History:  She has PALPITATIONS; GERD (gastroesophageal reflux disease); Constipation; Vitamin D deficiency; Depression, major, recurrent, in partial remission ([place]); Adenomatous colon polyp; Cholelithiasis; Mixed hyperlipidemia; Prediabetes; Obesity; and Essential hypertension on her problem list.   Health Maintenance:   Immunization History  Administered Date(s) Administered  . Influenza, Seasonal, Injecte, Preservative Fre 09/15/2016  . Tdap 02/27/2013   Tetanus:  2014 Pneumovax: Prevnar 13:  Flu vaccine: 2018 at work Zostavax:  Patient's last menstrual period was 08/13/2013. Pap: at GYN 10/31/2016 Dr. Garwin Brothers MGM: 10/2016 DEXA: Colonoscopy: 2013 due 2023 Dr. Hilarie Fredrickson EGD: Sleep study 2015 AHI 5  Patient Care Team: Unk Pinto, MD as PCP - General (Internal Medicine) Minette Headland, DDS as Referring Physician ([place]) Rutherford Guys, MD as Consulting Physician (Ophthalmology) Pyrtle, Lajuan Lines, MD as Consulting Physician (Gastroenterology) Servando Salina, MD as Consulting Physician (Obstetrics and Gynecology) Kennith Center, RD as Dietitian (Family Medicine)  Surgical History:  She has a past surgical history that includes Tubal ligation; Breast lumpectomy (Left); Colposcopy (2012); and Dilatation & curettage/hysteroscopy with myosure (N/A, 01/17/2017). Family History:  Herfamily history includes Aneurysm in her mother; Cancer in her maternal aunt; Diabetes in her maternal aunt; Heart disease in her mother; Hypertension in her mother; Lupus in her mother; Stroke in her mother. Social History:  She reports that she has been smoking Cigarettes.  She has a 0.50 pack-year smoking history.  She has never used smokeless tobacco. She reports that she drinks alcohol. She reports that she does not use drugs.  Review of Systems: Review of Systems  Constitutional: Negative.   HENT: Negative.   Eyes: Negative.   Respiratory: Negative.   Cardiovascular: Negative.   Gastrointestinal: Negative.   Genitourinary: Negative.   Musculoskeletal: Negative.   Skin: Positive for itching and rash.    Physical Exam: Estimated body mass index is 36.88 kg/m as calculated from the following:   Height as of this encounter: 5' 6.5" (1.689 m).   Weight as of this encounter: 232 lb (105.2 kg). BP 128/86   Pulse 84   Temp 97.6 F (36.4 C)   Resp 18   Ht 5' 6.5" (1.689 m)   Wt 232 lb (105.2 kg)   LMP 08/13/2013 Comment: negative urine  pregnancy test 12-2013  SpO2 99%   BMI 36.88 kg/m  General Appearance: Well nourished, in no apparent distress.  Eyes: PERRLA, EOMs, conjunctiva no swelling or erythema, normal fundi and vessels.  Sinuses: No Frontal/maxillary tenderness  ENT/Mouth: Ext aud canals clear, normal light reflex with TMs without erythema, bulging. Good dentition. No erythema, swelling, or exudate on post pharynx. Tonsils not swollen or erythematous. Hearing normal.  Neck: Supple, thyroid normal. No bruits  Respiratory: Respiratory effort normal, BS equal bilaterally without rales, rhonchi, wheezing or stridor.  Cardio: RRR without murmurs, rubs or gallops. Brisk peripheral pulses without edema.  Chest: symmetric, with normal excursions and percussion.  Breasts: defer GYN Abdomen: Soft, nontender, no guarding, rebound, hernias, masses, or organomegaly.  Lymphatics: Non tender without lymphadenopathy.  Genitourinary: defer GYN Musculoskeletal: Full ROM all peripheral extremities,5/5 strength, and normal gait.  Skin: bilateral feet with tinea pedis Warm, dry without rashes, lesions, ecchymosis. Neuro: Cranial nerves intact, reflexes equal bilaterally. Normal muscle tone, no cerebellar symptoms. Sensation intact.  Psych: Awake and oriented X 3, normal affect, Insight and Judgment appropriate.   EKG: defer AORTA SCAN: defer   Vicie Mutters 3:29 PM [facility]

## 2017-09-27 NOTE — Progress Notes (Signed)
Phentermine has been called into pharmacy on 1st Nov 2018 by DD

## 2017-09-28 LAB — CBC WITH DIFFERENTIAL/PLATELET
Basophils Absolute: 21 cells/uL (ref 0–200)
Basophils Relative: 0.4 %
Eosinophils Absolute: 122 cells/uL (ref 15–500)
Eosinophils Relative: 2.3 %
HCT: 37.6 % (ref 35.0–45.0)
Hemoglobin: 12.2 g/dL (ref 11.7–15.5)
Lymphs Abs: 2486 cells/uL (ref 850–3900)
MCH: 28.6 pg (ref 27.0–33.0)
MCHC: 32.4 g/dL (ref 32.0–36.0)
MCV: 88.1 fL (ref 80.0–100.0)
MONOS PCT: 10 %
MPV: 9.8 fL (ref 7.5–12.5)
NEUTROS PCT: 40.4 %
Neutro Abs: 2141 cells/uL (ref 1500–7800)
PLATELETS: 264 10*3/uL (ref 140–400)
RBC: 4.27 10*6/uL (ref 3.80–5.10)
RDW: 12.4 % (ref 11.0–15.0)
TOTAL LYMPHOCYTE: 46.9 %
WBC: 5.3 10*3/uL (ref 3.8–10.8)
WBCMIX: 530 {cells}/uL (ref 200–950)

## 2017-09-28 LAB — BASIC METABOLIC PANEL WITH GFR
BUN: 17 mg/dL (ref 7–25)
CALCIUM: 9.8 mg/dL (ref 8.6–10.2)
CHLORIDE: 106 mmol/L (ref 98–110)
CO2: 31 mmol/L (ref 20–32)
Creat: 1.01 mg/dL (ref 0.50–1.10)
GFR, EST AFRICAN AMERICAN: 76 mL/min/{1.73_m2} (ref >=60)
GFR, Est Non African American: 66 mL/min/{1.73_m2} (ref >=60)
Glucose, Bld: 97 mg/dL (ref 65–99)
POTASSIUM: 4.7 mmol/L (ref 3.5–5.3)
Sodium: 143 mmol/L (ref 135–146)

## 2017-09-28 LAB — HEPATIC FUNCTION PANEL
AG RATIO: 1.6 (calc) (ref 1.0–2.5)
ALKALINE PHOSPHATASE (APISO): 83 U/L (ref 33–115)
ALT: 35 U/L — AB (ref 6–29)
AST: 25 U/L (ref 10–35)
Albumin: 4.1 g/dL (ref 3.6–5.1)
BILIRUBIN TOTAL: 0.3 mg/dL (ref 0.2–1.2)
Bilirubin, Direct: 0.1 mg/dL (ref 0.0–0.2)
GLOBULIN: 2.6 g/dL (ref 1.9–3.7)
Indirect Bilirubin: 0.2 mg/dL (calc) (ref 0.2–1.2)
TOTAL PROTEIN: 6.7 g/dL (ref 6.1–8.1)

## 2017-09-28 LAB — URINALYSIS, ROUTINE W REFLEX MICROSCOPIC
Bilirubin Urine: NEGATIVE
Glucose, UA: NEGATIVE
HGB URINE DIPSTICK: NEGATIVE
Ketones, ur: NEGATIVE
LEUKOCYTES UA: NEGATIVE
NITRITE: NEGATIVE
PROTEIN: NEGATIVE
Specific Gravity, Urine: 1.022 (ref 1.001–1.03)
pH: 7.5 (ref 5.0–8.0)

## 2017-09-28 LAB — HEMOGLOBIN A1C
EAG (MMOL/L): 7 (calc)
Hgb A1c MFr Bld: 6 % of total Hgb — ABNORMAL HIGH (ref <5.7)
Mean Plasma Glucose: 126 (calc)

## 2017-09-28 LAB — LIPID PANEL
CHOL/HDL RATIO: 3.7 (calc) (ref <5.0)
CHOLESTEROL: 183 mg/dL (ref <200)
HDL: 50 mg/dL — AB (ref >50)
LDL CHOLESTEROL (CALC): 106 mg/dL — AB
NON-HDL CHOLESTEROL (CALC): 133 mg/dL — AB (ref <130)
TRIGLYCERIDES: 152 mg/dL — AB (ref <150)

## 2017-09-28 LAB — TSH: TSH: 0.89 mIU/L

## 2017-09-28 LAB — MAGNESIUM: Magnesium: 2.2 mg/dL (ref 1.5–2.5)

## 2017-09-28 LAB — VITAMIN D 25 HYDROXY (VIT D DEFICIENCY, FRACTURES): VIT D 25 HYDROXY: 22 ng/mL — AB (ref 30–100)

## 2017-09-28 LAB — IRON, TOTAL/TOTAL IRON BINDING CAP
%SAT: 24 % (calc) (ref 11–50)
Iron: 71 ug/dL (ref 40–190)
TIBC: 292 mcg/dL (calc) (ref 250–450)

## 2017-09-28 LAB — MICROALBUMIN / CREATININE URINE RATIO
Creatinine, Urine: 175 mg/dL (ref 20–275)
MICROALB UR: 0.3 mg/dL
MICROALB/CREAT RATIO: 2 ug/mg{creat} (ref <30)

## 2017-09-28 LAB — VITAMIN B12: Vitamin B-12: 499 pg/mL (ref 200–1100)

## 2017-09-28 MED FILL — PHENTERMINE 37.5 MG TABLET: 37.5 | 30 days supply | Qty: 30 | Fill #0

## 2017-09-28 MED FILL — METOPROLOL SUCC ER 25 MG TA: 25 | 30 days supply | Qty: 30 | Fill #2

## 2017-10-03 ENCOUNTER — Encounter: Payer: Self-pay | Admitting: Physician Assistant

## 2017-10-03 ENCOUNTER — Other Ambulatory Visit: Payer: Self-pay | Admitting: Physician Assistant

## 2017-10-03 DIAGNOSIS — B353 Tinea pedis: Secondary | ICD-10-CM

## 2017-10-03 MED ORDER — LORCASERIN HCL ER 20 MG PO TB24
20.0000 mg | ORAL_TABLET | Freq: Every day | ORAL | 3 refills | Status: AC
Start: 2017-10-03 — End: 2018-10-02

## 2017-10-29 ENCOUNTER — Encounter: Payer: Self-pay | Admitting: Physician Assistant

## 2017-11-01 MED FILL — DULoxetine HCL 60 MG CPEP: 60 | 30 days supply | Qty: 30 | Fill #0

## 2017-11-06 ENCOUNTER — Ambulatory Visit: Payer: Self-pay | Admitting: Physician Assistant

## 2017-12-06 ENCOUNTER — Other Ambulatory Visit: Payer: Self-pay | Admitting: Internal Medicine

## 2017-12-06 ENCOUNTER — Encounter: Payer: Self-pay | Admitting: Physician Assistant

## 2017-12-06 MED ORDER — DULOXETINE HCL 60 MG PO CPEP: 60.0000 mg | ORAL_CAPSULE | Freq: Every day | ORAL | 1 refills | Status: DC

## 2017-12-06 MED FILL — METOPROLOL SUCC ER 25 MG TA: 25 | 30 days supply | Qty: 30 | Fill #3

## 2017-12-07 MED FILL — DULoxetine HCL 60 MG CPEP: 60 | 30 days supply | Qty: 30 | Fill #0

## 2017-12-20 ENCOUNTER — Other Ambulatory Visit: Payer: Self-pay | Admitting: Internal Medicine

## 2017-12-20 DIAGNOSIS — Z139 Encounter for screening, unspecified: Secondary | ICD-10-CM

## 2018-01-01 ENCOUNTER — Encounter: Payer: Self-pay | Admitting: Physician Assistant

## 2018-01-08 ENCOUNTER — Other Ambulatory Visit: Payer: Self-pay

## 2018-01-09 ENCOUNTER — Ambulatory Visit (INDEPENDENT_AMBULATORY_CARE_PROVIDER_SITE_OTHER): Payer: Self-pay | Admitting: Orthopaedic Surgery

## 2018-01-09 ENCOUNTER — Ambulatory Visit
Admission: RE | Admit: 2018-01-09 | Discharge: 2018-01-09 | Disposition: A | Discharge status: Home / Self Care | Payer: PRIVATE HEALTH INSURANCE | Source: Ambulatory Visit | Attending: Internal Medicine | Admitting: Internal Medicine

## 2018-01-09 DIAGNOSIS — Z139 Encounter for screening, unspecified: Secondary | ICD-10-CM

## 2018-01-22 MED FILL — DULoxetine HCL 60 MG CPEP: 60 | 30 days supply | Qty: 30 | Fill #1

## 2018-02-04 ENCOUNTER — Encounter: Payer: Self-pay | Admitting: Physician Assistant

## 2018-02-04 ENCOUNTER — Ambulatory Visit (INDEPENDENT_AMBULATORY_CARE_PROVIDER_SITE_OTHER): Payer: 59 | Admitting: Physician Assistant

## 2018-02-04 VITALS — BP 132/86 | HR 89 | Temp 97.6°F | Resp 16 | Ht 66.5 in | Wt 239.0 lb

## 2018-02-04 DIAGNOSIS — R350 Frequency of micturition: Secondary | ICD-10-CM

## 2018-02-04 DIAGNOSIS — N760 Acute vaginitis: Secondary | ICD-10-CM

## 2018-02-04 LAB — URINALYSIS, ROUTINE W REFLEX MICROSCOPIC
Bilirubin Urine: NEGATIVE
Glucose, UA: NEGATIVE
HGB URINE DIPSTICK: NEGATIVE
Ketones, ur: NEGATIVE
Leukocytes, UA: NEGATIVE
NITRITE: NEGATIVE
PROTEIN: NEGATIVE
Specific Gravity, Urine: 1.012 (ref 1.001–1.03)
pH: 7.5 (ref 5.0–8.0)

## 2018-02-04 MED ORDER — METRONIDAZOLE 500 MG PO TABS: 500.0000 mg | ORAL_TABLET | Freq: Two times a day (BID) | ORAL | 0 refills | Status: AC

## 2018-02-04 MED FILL — metroNIDAZOLE 500 MG TABS: 500 | 7 days supply | Qty: 14 | Fill #0

## 2018-02-04 NOTE — Progress Notes (Signed)
   Subjective:    Patient ID: Rebecca [name], female    DOB: [birth year], 47 y.o.   MRN: [id number]  HPI 47 y.o. AAF presents with 1 week of vaginal symptoms.  She has fishy odor, has history of BV in the past, no discharge, she has lower back pain, some urinary frequency/odor but no dysuria. She is sexually active, denies new partners.   Blood pressure 132/86, pulse 89, temperature 97.6 F (36.4 C), resp. rate 16, height 5' 6.5" (1.689 m), weight 239 lb (108.4 kg), last menstrual period 08/13/2013, SpO2 98 %.  Medications Current Outpatient Medications on File Prior to Visit  Medication Sig  . BIOTIN PO Take 1 capsule by mouth daily.  . DULoxetine (CYMBALTA) 60 MG capsule Take 1 capsule (60 mg total) by mouth daily.  Marland Kitchen ibuprofen (ADVIL,MOTRIN) 200 MG tablet Take 600 mg by mouth every 6 (six) hours as needed for headache or moderate pain.  Marland Kitchen ketoconazole (NIZORAL) 2 % cream Apply 1 application topically 2 (two) times daily.  . Lorcaserin HCl ER (BELVIQ XR) 20 MG TB24 Take 20 mg daily by mouth.  . Multiple Vitamin (MULTIVITAMIN WITH MINERALS) TABS tablet Take 1 tablet by mouth daily.  . metoprolol succinate (TOPROL XL) 25 MG 24 hr tablet Take 1 tablet (25 mg total) by mouth daily.   No current facility-administered medications on file prior to visit.     Problem list She has GERD (gastroesophageal reflux disease); Constipation; Vitamin D deficiency; Depression, major, recurrent, in partial remission ([place]); Adenomatous colon polyp; Cholelithiasis; Mixed hyperlipidemia; Prediabetes; Obesity; and Essential hypertension on their problem list.   Review of Systems See above    Objective:   Physical Exam  Abdominal: Soft. Bowel sounds are normal. There is no tenderness.  Genitourinary: Uterus normal. Vaginal discharge (thin white/yelllow discharge, no ulcers) found.  Musculoskeletal: She exhibits no tenderness.       Assessment & Plan:    Urinary frequency -     Urinalysis,  Routine w reflex microscopic -     Urine Culture  Acute vaginitis -     C. trachomatis/N. gonorrhoeae RNA -     WET PREP BY MOLECULAR PROBE  -     metroNIDAZOLE (FLAGYL) 500 MG tablet; Take 1 tablet (500 mg total) by mouth 2 (two) times daily for 7 days.

## 2018-02-05 LAB — URINE CULTURE
MICRO NUMBER: 90308066
SPECIMEN QUALITY:: ADEQUATE

## 2018-02-05 LAB — C. TRACHOMATIS/N. GONORRHOEAE RNA
C. trachomatis RNA, TMA: NOT DETECTED
N. gonorrhoeae RNA, TMA: NOT DETECTED

## 2018-02-05 LAB — WET PREP BY MOLECULAR PROBE
CANDIDA SPECIES: NOT DETECTED
GARDNERELLA VAGINALIS: NOT DETECTED
MICRO NUMBER:: 90309662
SOURCE:: 0
SPECIMEN QUALITY:: ADEQUATE
TRICHOMONAS VAG: NOT DETECTED

## 2018-02-18 ENCOUNTER — Other Ambulatory Visit: Payer: Self-pay | Admitting: Physician Assistant

## 2018-02-18 ENCOUNTER — Encounter: Payer: Self-pay | Admitting: Physician Assistant

## 2018-02-18 MED ORDER — FLUCONAZOLE 150 MG PO TABS: 150.0000 mg | ORAL_TABLET | Freq: Every day | ORAL | 3 refills | Status: DC

## 2018-02-19 MED FILL — FLUCONAZOLE 150 MG TABLET: 150 | 1 days supply | Qty: 1 | Fill #0

## 2018-03-01 MED FILL — DULoxetine HCL 60 MG CPEP: 60 | 30 days supply | Qty: 30 | Fill #2

## 2018-04-02 MED FILL — FLUCONAZOLE 150 MG TABS: 150 | 1 days supply | Qty: 1 | Fill #1

## 2018-04-02 MED FILL — DULoxetine HCL 60 MG CPEP: 60 | 30 days supply | Qty: 30 | Fill #3

## 2018-05-03 ENCOUNTER — Ambulatory Visit: Payer: Self-pay | Admitting: Podiatry

## 2018-05-17 ENCOUNTER — Encounter: Payer: Self-pay | Admitting: Podiatry

## 2018-05-17 ENCOUNTER — Ambulatory Visit (INDEPENDENT_AMBULATORY_CARE_PROVIDER_SITE_OTHER): Payer: 59 | Admitting: Podiatry

## 2018-05-17 DIAGNOSIS — Z79899 Other long term (current) drug therapy: Secondary | ICD-10-CM

## 2018-05-17 DIAGNOSIS — B353 Tinea pedis: Secondary | ICD-10-CM | POA: Insufficient documentation

## 2018-05-17 DIAGNOSIS — B351 Tinea unguium: Secondary | ICD-10-CM | POA: Diagnosis not present

## 2018-05-17 NOTE — Progress Notes (Signed)
Subjective:    Patient ID: Rebecca [name], female    DOB: 03/15/1971, 47 y.o.   MRN: [id number]  HPI 47 year old female presents the office today for concerns of athlete's foot as well as toenail fungus is been ongoing for several years.  She is tried numerous over-the-counter creams any significant improvement.  She also follow-up with her primary care physician she was given Lamisil but she did not want to take it given potential side effects.  She also was given a cream but she has not been using this.  She denies any open sores or any drainage.  She has no other concerns.   Review of Systems  All other systems reviewed and are negative.  Past Medical History:  Diagnosis Date  . Anemia   . Colon polyp 12/25/2011   Tubular adenomatous  . Depression    following death of mother  . Duodenitis   . DVT (deep vein thrombosis) in pregnancy ([place])   . Esophagitis   . Fatigue   . Gallstones   . GERD (gastroesophageal reflux disease)   . Hemorrhagic ovarian cyst 05/22/2012   04/2012   . Infection    UTI  . Neuromuscular disorder ([place])    hiatal hernia  . Obesity   . PUD (peptic ulcer disease)   . Sacroiliac pain   . Vaginal Pap smear, abnormal    colpo, results ok  . Vitamin D deficiency     Past Surgical History:  Procedure Laterality Date  . BREAST EXCISIONAL BIOPSY Left    benign  . BREAST LUMPECTOMY Left    benign  . COLPOSCOPY  2012   NEG  . DILATATION & CURETTAGE/HYSTEROSCOPY WITH MYOSURE N/A 01/17/2017   Procedure: [place];  Surgeon: Servando Salina, MD;  Location: [facility];  Service: Gynecology;  Laterality: N/A;  . TUBAL LIGATION       Current Outpatient Medications:  .  BIOTIN PO, Take 1 capsule by mouth daily., Disp: , Rfl:  .  DULoxetine (CYMBALTA) 60 MG capsule, Take 1 capsule (60 mg total) by mouth daily., Disp: 90 capsule, Rfl: 1 .  fluconazole (DIFLUCAN) 150 MG tablet, Take 1 tablet (150 mg total) by mouth  daily. (Patient not taking: Reported on 05/17/2018), Disp: 1 tablet, Rfl: 3 .  ibuprofen (ADVIL,MOTRIN) 200 MG tablet, Take 600 mg by mouth every 6 (six) hours as needed for headache or moderate pain., Disp: , Rfl:  .  ketoconazole (NIZORAL) 2 % cream, Apply 1 application topically 2 (two) times daily., Disp: 60 g, Rfl: 2 .  Lorcaserin HCl ER (BELVIQ XR) 20 MG TB24, Take 20 mg daily by mouth. (Patient not taking: Reported on 05/17/2018), Disp: 30 tablet, Rfl: 3 .  metoprolol succinate (TOPROL XL) 25 MG 24 hr tablet, Take 1 tablet (25 mg total) by mouth daily., Disp: 90 tablet, Rfl: 3 .  Multiple Vitamin (MULTIVITAMIN WITH MINERALS) TABS tablet, Take 1 tablet by mouth daily., Disp: , Rfl:   No Known Allergies       Objective:   Physical Exam  General: AAO x3, NAD  Dermatological: There is dry, peeling skin consistent with tinea pedis bilaterally is on the plantar aspect of foot as well as interdigitally.  The nails are hypertrophic, dystrophic with yellow-brown discoloration.  There is no pain to the nails there is no surrounding redness or drainage or any clinical signs of infection.  No open lesion identified today.  Vascular: Dorsalis Pedis artery and Posterior Tibial artery pedal  pulses are 2/4 bilateral with immedate capillary fill time. Pedal hair growth present. No varicosities and no lower extremity edema present bilateral. There is no pain with calf compression, swelling, warmth, erythema.   Neruologic: Grossly intact via light touch bilateral.  Protective threshold with Semmes Wienstein monofilament intact to all pedal sites bilateral.   Musculoskeletal: No gross boney pedal deformities bilateral. No pain, crepitus, or limitation noted with foot and ankle range of motion bilateral. Muscular strength 5/5 in all groups tested bilateral.  Gait: Unassisted, Nonantalgic.     Assessment & Plan:  47 year old female with onychomycosis, tinea pedis -Treatment options discussed including  all alternatives, risks, and complications -Etiology of symptoms were discussed -We discussed treatment options.  After long discussion in regard to oral, topical, laser therapy she would like to do the Lamisil.  We will recheck a CBC and LFT prior to starting the medications.  We discussed side effects of Lamisil as well as success rates.  I will see her back in 4 weeks after starting the medication.  She has no further questions or concerns.  She is not to start the medication until I call her with the results of the blood work and she verbally understood.  Trula Slade DPM

## 2018-05-17 NOTE — Patient Instructions (Signed)

## 2018-06-19 MED FILL — DULoxetine HCL 60 MG CPEP: 60 | 30 days supply | Qty: 30 | Fill #4

## 2018-06-19 MED FILL — FLUCONAZOLE 150 MG TABS: 150 | 1 days supply | Qty: 1 | Fill #2

## 2018-07-03 ENCOUNTER — Ambulatory Visit (INDEPENDENT_AMBULATORY_CARE_PROVIDER_SITE_OTHER): Payer: Self-pay

## 2018-07-03 ENCOUNTER — Encounter (INDEPENDENT_AMBULATORY_CARE_PROVIDER_SITE_OTHER): Payer: Self-pay | Admitting: Physician Assistant

## 2018-07-03 ENCOUNTER — Ambulatory Visit (INDEPENDENT_AMBULATORY_CARE_PROVIDER_SITE_OTHER): Payer: 59 | Admitting: Physician Assistant

## 2018-07-03 DIAGNOSIS — M25562 Pain in left knee: Secondary | ICD-10-CM

## 2018-07-03 DIAGNOSIS — G8929 Other chronic pain: Secondary | ICD-10-CM | POA: Insufficient documentation

## 2018-07-03 MED ORDER — BUPIVACAINE HCL 0.25 % IJ SOLN
2.0000 mL | INTRAMUSCULAR | Status: AC | PRN
  Administered 2018-07-03: 2 mL via INTRA_ARTICULAR

## 2018-07-03 MED ORDER — LIDOCAINE HCL 1 % IJ SOLN
2.0000 mL | INTRAMUSCULAR | Status: AC | PRN
  Administered 2018-07-03: 2 mL

## 2018-07-03 MED ORDER — METHYLPREDNISOLONE ACETATE 40 MG/ML IJ SUSP
40.0000 mg | INTRAMUSCULAR | Status: AC | PRN
  Administered 2018-07-03: 40 mg via INTRA_ARTICULAR

## 2018-07-03 NOTE — Progress Notes (Signed)
Office Visit Note   Patient: Rebecca [name]           Date of Birth: 01-31-1971           MRN: [id number] Visit Date: 07/03/2018              Requested by: Unk Pinto, MD [address] [place], [place] [address] PCP: Unk Pinto, MD   Assessment & Plan: Visit Diagnoses:  1. Chronic pain of left knee     Plan: Impression is left knee osteoarthritis.  Today, we injected the left knee with cortisone.  She will follow-up with [place] as needed.  Call with concerns or questions.  Follow-Up Instructions: Return if symptoms worsen or fail to improve.   Orders:  Orders Placed This Encounter  Procedures  . Large Joint Inj: L knee  . XR KNEE 3 VIEW LEFT   No orders of the defined types were placed in this encounter.     Procedures: Large Joint Inj: L knee on 07/03/2018 1:30 PM Indications: pain Details: 22 G needle, anterolateral approach Medications: 2 mL lidocaine 1 %; 2 mL bupivacaine 0.25 %; 40 mg methylPREDNISolone acetate 40 MG/ML      Clinical Data: No additional findings.   Subjective: Chief Complaint  Patient presents with  . Left Knee - Pain    HPI patient is a pleasant 47 year old female who presents to our clinic today with left knee pain.  This is been ongoing for quite some time without any known injury or change in activity.  Over the past week it has worsened.  She does work 2 jobs where she stands on her feet nearly all day long.  The pain she has to the entire left knee but does admit to pain to the anterior thigh and groin.  She describes this as a constant ache with occasional sharp shooting pains when she squats.  She is tried [employer] as well as over-the-counter anti-inflammatories with minimal relief of symptoms over the past week.  No numbness, tingling or burning.  No previous cortisone injection or surgical intervention.  Review of Systems as detailed in HPI.  All others reviewed and are negative.   Objective: Vital  Signs: LMP 08/13/2013 Comment: negative urine pregnancy test 12-2013  Physical Exam well-developed and well-nourished female in no acute distress.  Alert and oriented x3.  Ortho Exam examination of the left knee shows no effusion.  Range of motion 0 to 110 degrees.  Moderate medial and lateral joint line tenderness.  Collaterals and cruciate stable.  Negative logroll and negative straight leg raise.  She is neurovascularly intact distally.  Specialty Comments:  No specialty comments available.  Imaging: Xr Knee 3 View Left  Result Date: 07/03/2018 X-rays demonstrate moderate medial compartment osteoarthritis    PMFS History: Patient Active Problem List   Diagnosis Date Noted  . Chronic pain of left knee 07/03/2018  . Onychomycosis 05/17/2018  . Tinea pedis of both feet 05/17/2018  . Prediabetes 11/16/2014  . Obesity 11/16/2014  . Essential hypertension 11/16/2014  . Mixed hyperlipidemia 10/06/2013  . Cholelithiasis 05/22/2012  . Adenomatous colon polyp 01/15/2012  . Constipation 12/12/2011  . Vitamin D deficiency 12/12/2011  . Depression, major, recurrent, in partial remission ([place]) 12/12/2011  . GERD (gastroesophageal reflux disease) 01/24/2007   Past Medical History:  Diagnosis Date  . Anemia   . Colon polyp 12/25/2011   Tubular adenomatous  . Depression    following death of mother  . Duodenitis   .  DVT (deep vein thrombosis) in pregnancy ([place])   . Esophagitis   . Fatigue   . Gallstones   . GERD (gastroesophageal reflux disease)   . Hemorrhagic ovarian cyst 05/22/2012   04/2012   . Infection    UTI  . Neuromuscular disorder ([place])    hiatal hernia  . Obesity   . PUD (peptic ulcer disease)   . Sacroiliac pain   . Vaginal Pap smear, abnormal    colpo, results ok  . Vitamin D deficiency     Family History  Problem Relation Age of Onset  . Aneurysm Mother   . Heart disease Mother   . Hypertension Mother   . Stroke Mother   . Lupus Mother   . Diabetes  Maternal Aunt   . Cancer Maternal Aunt   . Colon cancer Neg Hx     Past Surgical History:  Procedure Laterality Date  . BREAST EXCISIONAL BIOPSY Left    benign  . BREAST LUMPECTOMY Left    benign  . COLPOSCOPY  2012   NEG  . DILATATION & CURETTAGE/HYSTEROSCOPY WITH MYOSURE N/A 01/17/2017   Procedure: [place];  Surgeon: Servando Salina, MD;  Location: [facility];  Service: Gynecology;  Laterality: N/A;  . TUBAL LIGATION     Social History   Occupational History  . Occupation: NSNT    Employer: [employer]  Tobacco Use  . Smoking status: Current Some Day Smoker    Packs/day: 0.10    Years: 5.00    Pack years: 0.50    Types: Cigarettes  . Smokeless tobacco: Never Used  Substance and Sexual Activity  . Alcohol use: Yes    Alcohol/week: 0.0 oz    Comment: occasional glass of wine  . Drug use: No  . Sexual activity: Not on file

## 2018-08-05 MED FILL — DULoxetine HCL 60 MG CPEP: 60 | 30 days supply | Qty: 30 | Fill #5

## 2018-08-08 MED FILL — metroNIDAZOLE 500 MG TABS: 500 | 10 days supply | Qty: 20 | Fill #0

## 2018-09-30 ENCOUNTER — Other Ambulatory Visit: Payer: Self-pay | Admitting: Physician Assistant

## 2018-10-08 ENCOUNTER — Encounter: Payer: Self-pay | Admitting: Physician Assistant

## 2018-10-15 MED FILL — DULoxetine HCL 60 MG CPEP: 60 | 90 days supply | Qty: 90 | Fill #0

## 2018-12-31 MED FILL — DULoxetine HCL 60 MG CPEP: 60 | 90 days supply | Qty: 90 | Fill #1 | Status: TO

## 2019-02-11 ENCOUNTER — Ambulatory Visit (HOSPITAL_COMMUNITY)
Admission: EM | Admit: 2019-02-11 | Discharge: 2019-02-11 | Disposition: A | Discharge status: Home / Self Care | Payer: Self-pay | Attending: Family Medicine | Admitting: Family Medicine

## 2019-02-11 ENCOUNTER — Other Ambulatory Visit: Payer: Self-pay

## 2019-02-11 ENCOUNTER — Encounter (HOSPITAL_COMMUNITY): Payer: Self-pay | Admitting: Emergency Medicine

## 2019-02-11 ENCOUNTER — Telehealth: Payer: PRIVATE HEALTH INSURANCE | Admitting: Family

## 2019-02-11 DIAGNOSIS — M545 Low back pain, unspecified: Secondary | ICD-10-CM

## 2019-02-11 DIAGNOSIS — R197 Diarrhea, unspecified: Secondary | ICD-10-CM

## 2019-02-11 DIAGNOSIS — T148XXA Other injury of unspecified body region, initial encounter: Secondary | ICD-10-CM

## 2019-02-11 LAB — POCT URINALYSIS DIP (DEVICE)
Bilirubin Urine: NEGATIVE
Glucose, UA: NEGATIVE mg/dL
HGB URINE DIPSTICK: NEGATIVE
Ketones, ur: NEGATIVE mg/dL
Leukocytes,Ua: NEGATIVE
NITRITE: NEGATIVE
Protein, ur: NEGATIVE mg/dL
Specific Gravity, Urine: 1.025 (ref 1.005–1.030)
Urobilinogen, UA: 0.2 mg/dL (ref 0.0–1.0)
pH: 6.5 (ref 5.0–8.0)

## 2019-02-11 MED ORDER — KETOROLAC TROMETHAMINE 60 MG/2ML IM SOLN
INTRAMUSCULAR | Status: AC
  Filled 2019-02-11: qty 2

## 2019-02-11 MED ORDER — TIZANIDINE HCL 4 MG PO TABS: 4.0000 mg | ORAL_TABLET | Freq: Four times a day (QID) | ORAL | 0 refills | Status: DC | PRN

## 2019-02-11 MED ORDER — HYDROCODONE-ACETAMINOPHEN 5-325 MG PO TABS: 2.0000 | ORAL_TABLET | ORAL | 0 refills | Status: DC | PRN

## 2019-02-11 MED ORDER — KETOROLAC TROMETHAMINE 60 MG/2ML IM SOLN
60.0000 mg | Freq: Once | INTRAMUSCULAR | Status: AC
  Administered 2019-02-11: 60 mg via INTRAMUSCULAR

## 2019-02-11 NOTE — Progress Notes (Signed)
Based on what you shared with me, I feel your condition warrants further evaluation and I recommend that you be seen for a face to face office visit.   NOTE: If you entered your credit card information for this eVisit, you will not be charged. You may see a "hold" on your card for the $35 but that hold will drop off and you will not have a charge processed.  If you are having a true medical emergency please call 911.  If you need an urgent face to face visit, [facility] has four urgent care centers for your convenience.  If you need care fast and have a high deductible or no insurance consider:   [email or web address] to reserve your spot online an avoid wait times  [facility] [address] [place], [place] [address] 8 am to 8 pm Monday-Friday 10 am to 4 pm Saturday-Sunday *Across the street from [employer]  [place], [address] 8 am to 5 pm Monday-Friday * In the [facility] on the [facility]   The following sites will take your insurance:  . [facility] [place] a Provider at this Location  [address] [place], [place] [address] . 10 am to 8 pm Monday-Friday . 12 pm to 8 pm Saturday-Sunday   . [facility] at [place] a Provider at this Location  [place], [place], [place] [address] . 8 am to 8 pm Monday-Friday . 9 am to 6 pm Saturday . 11 am to 6 pm Sunday   . [facility] at [place] Get Driving Directions  [address] [place], [place] [address] . 8 am to 8 pm Monday-Friday . 8 am to 4 pm Saturday-Sunday   Your e-visit answers were reviewed by a board certified advanced clinical practitioner to complete your personal care plan.  Thank you for using e-Visits.

## 2019-02-11 NOTE — Discharge Instructions (Addendum)
Continue to stay well hydrated, alternating gatorade and clear liquids. Drink enough to keep urine looking clear.  You may eat a bland diet if you have an appetite. You can take immodium or pepto bismol to help with diarrhea. Take vicodin every 4 hours as needed for your back pain. Take the muscle relaxer tizanidine as needed. See your PCP or return here if symptoms are not improving within the next 48-72 hours.

## 2019-02-11 NOTE — ED Notes (Signed)
Patient ambulatory to bathroom with steady gait at this time to provide sample.

## 2019-02-11 NOTE — ED Triage Notes (Signed)
Pt here for nausea and diarrhea with body aches

## 2019-02-11 NOTE — ED Provider Notes (Signed)
[facility]    CSN: [id number] Arrival date & time: 02/11/19  1057     History   Chief Complaint Chief Complaint  Patient presents with  . Nausea  . Generalized Body Aches    HPI Rebecca [name] is a 48 y.o. female.   HPI  The patient presents with diarrhea and Left flank pain that have been ongoing since last Tuesday. She reports flank pain is sharp in nature. She has increased her water intake thinking she may have had a urinary infection but reports that the pain has not decreased. She denies dysuria, hematuria or foul odor to her urine. She denies any radiation of her flank pain. She also endorses diarrhea that she reports has been occurring about 3 times per day since last Tuesday. She endorses some nausea but denies abdominal pain or vomiting. She has had no bloody or dark, tarry looking stools. She denies fevers but endorses some chills. She has been taking ibuprofen for her flank pain which she reports helps ease the pain until it wears off. She did an E-visit this morning and was told that she needed to be seen in person for further evaluation.  No history of kidney stone or infections No known back condition or strain/injury to back  Past Medical History:  Diagnosis Date  . Anemia   . Colon polyp 12/25/2011   Tubular adenomatous  . Depression    following death of mother  . Duodenitis   . 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 Esophagitis   . Fatigue   . Gallstones   . GERD (gastroesophageal reflux disease)   . Hemorrhagic ovarian cyst 05/22/2012   04/2012   . Infection    UTI  . Neuromuscular disorder ([place])    hiatal hernia  . Obesity   . PUD (peptic ulcer disease)   . Sacroiliac pain   . Vaginal Pap smear, abnormal    colpo, results ok  . Vitamin D deficiency     Patient Active Problem List   Diagnosis Date Noted  . Chronic pain of left knee 07/03/2018  . Onychomycosis 05/17/2018  . Tinea pedis of both feet 05/17/2018  . Prediabetes  11/16/2014  . Obesity 11/16/2014  . Essential hypertension 11/16/2014  . Mixed hyperlipidemia 10/06/2013  . Cholelithiasis 05/22/2012  . Adenomatous colon polyp 01/15/2012  . Constipation 12/12/2011  . Vitamin D deficiency 12/12/2011  . Depression, major, recurrent, in partial remission ([place]) 12/12/2011  . GERD (gastroesophageal reflux disease) 01/24/2007    Past Surgical History:  Procedure Laterality Date  . BREAST EXCISIONAL BIOPSY Left    benign  . BREAST LUMPECTOMY Left    benign  . COLPOSCOPY  2012   NEG  . DILATATION & CURETTAGE/HYSTEROSCOPY WITH MYOSURE N/A 01/17/2017   Procedure: [place];  Surgeon: Servando Salina, MD;  Location: [facility];  Service: Gynecology;  Laterality: N/A;  . TUBAL LIGATION      OB History    Gravida  3   Para  2   Term  2   Preterm      AB  1   Living  2     SAB  1   TAB      Ectopic      Multiple      Live Births               Home Medications    Prior to Admission medications   Medication Sig Start Date  End Date Taking? Authorizing Provider  BIOTIN PO Take 1 capsule by mouth daily.    [provider]  DULoxetine (CYMBALTA) 60 MG capsule TAKE 1 CAPSULE BY MOUTH DAILY. 09/30/18   Unk Pinto, MD  HYDROcodone-acetaminophen (NORCO/VICODIN) 5-325 MG tablet Take 2 tablets by mouth every 4 (four) hours as needed. 02/11/19   Raylene Everts, MD  ibuprofen (ADVIL,MOTRIN) 200 MG tablet Take 600 mg by mouth every 6 (six) hours as needed for headache or moderate pain.    [provider]  ketoconazole (NIZORAL) 2 % cream Apply 1 application topically 2 (two) times daily. 09/27/17   Vicie Mutters, PA-C  metoprolol succinate (TOPROL XL) 25 MG 24 hr tablet Take 1 tablet (25 mg total) by mouth daily. 12/28/16 12/28/17  Rolene Course, PA-C  Multiple Vitamin (MULTIVITAMIN WITH MINERALS) TABS tablet Take 1 tablet by mouth daily.    [provider]  tiZANidine  (ZANAFLEX) 4 MG tablet Take 1-2 tablets (4-8 mg total) by mouth every 6 (six) hours as needed for muscle spasms. 02/11/19   Raylene Everts, MD    Family History Family History  Problem Relation Age of Onset  . Aneurysm Mother   . Heart disease Mother   . Hypertension Mother   . Stroke Mother   . Lupus Mother   . Diabetes Maternal Aunt   . Cancer Maternal Aunt   . Colon cancer Neg Hx     Social History Social History   Tobacco Use  . Smoking status: Current Some Day Smoker    Packs/day: 0.10    Years: 5.00    Pack years: 0.50    Types: Cigarettes  . Smokeless tobacco: Never Used  Substance Use Topics  . Alcohol use: Yes    Alcohol/week: 0.0 standard drinks    Comment: occasional glass of wine  . Drug use: No     Allergies   Patient has no known allergies.   Review of Systems Review of Systems  Constitutional: Positive for appetite change and chills. Negative for fever.  HENT: Negative for ear pain and sore throat.   Eyes: Negative for pain and visual disturbance.  Respiratory: Negative for cough and shortness of breath.   Cardiovascular: Negative for chest pain and palpitations.  Gastrointestinal: Positive for diarrhea and nausea. Negative for abdominal pain, blood in stool and vomiting.  Genitourinary: Positive for flank pain. Negative for difficulty urinating, dysuria, frequency, hematuria and urgency.  Musculoskeletal: Negative for arthralgias and back pain.  Skin: Negative for color change and rash.  Neurological: Negative for seizures and syncope.  Psychiatric/Behavioral: Negative.   All other systems reviewed and are negative.    Physical Exam Triage Vital Signs ED Triage Vitals [02/11/19 1211]  Enc Vitals Group     BP (!) 152/82     Pulse Rate 85     Resp 18     Temp 98 F (36.7 C)     Temp Source Temporal     SpO2 100 %     Weight      Height      Head Circumference      Peak Flow      Pain Score 7     Pain Loc      Pain Edu?       Excl. in [place]?    No data found.  Updated Vital Signs BP (!) 152/82 (BP Location: Right Arm)   Pulse 85   Temp 98 F (36.7 C) (Temporal)   Resp 18  LMP 08/13/2013 Comment: negative urine pregnancy test 12-2013  SpO2 100%    Physical Exam Constitutional:      General: She is not in acute distress.    Appearance: She is well-developed.  HENT:     Head: Normocephalic and atraumatic.  Eyes:     Conjunctiva/sclera: Conjunctivae normal.     Pupils: Pupils are equal, round, and reactive to light.  Neck:     Musculoskeletal: Normal range of motion.  Cardiovascular:     Rate and Rhythm: Normal rate.     Pulses: Normal pulses.     Heart sounds: Normal heart sounds.  Pulmonary:     Effort: Pulmonary effort is normal. No respiratory distress.     Breath sounds: No rhonchi or rales.  Abdominal:     General: There is no distension.     Palpations: Abdomen is soft.     Tenderness: There is no abdominal tenderness. There is left CVA tenderness.     Comments: Patient appears uncomfortable.  Leaning towards L side.  Tender to palpation L upper lumbar/lower rib area.  Musculoskeletal: Normal range of motion.  Skin:    General: Skin is warm and dry.  Neurological:     General: No focal deficit present.     Mental Status: She is alert and oriented to person, place, and time.  Psychiatric:        Mood and Affect: Mood normal.      UC Treatments / Results  Labs (all labs ordered are listed, but only abnormal results are displayed) Labs Reviewed  GASTROINTESTINAL PANEL BY PCR, STOOL (REPLACES STOOL CULTURE)  POCT URINALYSIS DIP (DEVICE)    EKG None  Radiology No results found.  Procedures Procedures (including critical care time)  Medications Ordered in UC Medications  ketorolac (TORADOL) injection 60 mg (60 mg Intramuscular Given 02/11/19 1324)    Initial Impression / Assessment and Plan / UC Course  I have reviewed the triage vital signs and the nursing notes.   Pertinent labs & imaging results that were available during my care of the patient were reviewed by me and considered in my medical decision making (see chart for details).  Clinical Course as of Feb 10 1338  Tue Feb 11, 2019  1247 POCT Urinalysis, Dipstick [YN]    Clinical Course User Index [YN] Raylene Everts, MD   Patient has back pain of about a week duration.  It is in the left flank area.  Worse with movement.  No nausea or vomiting or urinary symptoms with a negative urinalysis.  I do not think this is related to her kidneys as much as a musculoligamentous back pain.  Uncertain coincidental diarrhea.  She states she is had loose bowels, cramping, decreased appetite, nausea for a week.  No travel.  No recent antibiotics.  No one else at home is sick.  We can do a GI pathogen panel to find out the cause of the diarrhea.  Patient feels like it may be "food poisoning"  Final Clinical Impressions(s) / UC Diagnoses   Final diagnoses:  Diarrhea, unspecified type  Muscle strain     Discharge Instructions     Continue to stay well hydrated, alternating gatorade and clear liquids. Drink enough to keep urine looking clear.  You may eat a bland diet if you have an appetite. You can take immodium or pepto bismol to help with diarrhea. Take vicodin every 4 hours as needed for your back pain. Take the muscle relaxer tizanidine as  needed. See your PCP or return here if symptoms are not improving within the next 48-72 hours.     ED Prescriptions    Medication Sig Dispense Auth. Provider   tiZANidine (ZANAFLEX) 4 MG tablet Take 1-2 tablets (4-8 mg total) by mouth every 6 (six) hours as needed for muscle spasms. 21 tablet Raylene Everts, MD   HYDROcodone-acetaminophen (NORCO/VICODIN) 5-325 MG tablet Take 2 tablets by mouth every 4 (four) hours as needed. 10 tablet Raylene Everts, MD     Controlled Substance Prescriptions [place] Controlled Substance Registry consulted?yes   Raylene Everts, MD 02/11/19 1340

## 2019-03-26 ENCOUNTER — Other Ambulatory Visit: Payer: Self-pay | Admitting: Specialist

## 2019-03-26 DIAGNOSIS — Z1231 Encounter for screening mammogram for malignant neoplasm of breast: Secondary | ICD-10-CM

## 2019-04-25 ENCOUNTER — Emergency Department (HOSPITAL_COMMUNITY): Payer: 59

## 2019-04-25 ENCOUNTER — Emergency Department (HOSPITAL_COMMUNITY)
Admission: EM | Admit: 2019-04-25 | Discharge: 2019-04-25 | Disposition: A | Discharge status: Home / Self Care | Payer: 59 | Attending: Emergency Medicine | Admitting: Emergency Medicine

## 2019-04-25 ENCOUNTER — Encounter (HOSPITAL_COMMUNITY): Payer: Self-pay | Admitting: Emergency Medicine

## 2019-04-25 DIAGNOSIS — M25512 Pain in left shoulder: Secondary | ICD-10-CM | POA: Insufficient documentation

## 2019-04-25 DIAGNOSIS — R0689 Other abnormalities of breathing: Secondary | ICD-10-CM | POA: Diagnosis not present

## 2019-04-25 DIAGNOSIS — M25511 Pain in right shoulder: Secondary | ICD-10-CM | POA: Insufficient documentation

## 2019-04-25 DIAGNOSIS — R110 Nausea: Secondary | ICD-10-CM | POA: Insufficient documentation

## 2019-04-25 DIAGNOSIS — I10 Essential (primary) hypertension: Secondary | ICD-10-CM | POA: Diagnosis not present

## 2019-04-25 DIAGNOSIS — Z1159 Encounter for screening for other viral diseases: Secondary | ICD-10-CM | POA: Insufficient documentation

## 2019-04-25 DIAGNOSIS — R197 Diarrhea, unspecified: Secondary | ICD-10-CM | POA: Insufficient documentation

## 2019-04-25 DIAGNOSIS — Z79899 Other long term (current) drug therapy: Secondary | ICD-10-CM | POA: Diagnosis not present

## 2019-04-25 DIAGNOSIS — R52 Pain, unspecified: Secondary | ICD-10-CM | POA: Diagnosis not present

## 2019-04-25 DIAGNOSIS — F17210 Nicotine dependence, cigarettes, uncomplicated: Secondary | ICD-10-CM | POA: Insufficient documentation

## 2019-04-25 DIAGNOSIS — Z9114 Patient's other noncompliance with medication regimen: Secondary | ICD-10-CM | POA: Insufficient documentation

## 2019-04-25 DIAGNOSIS — R072 Precordial pain: Secondary | ICD-10-CM | POA: Diagnosis not present

## 2019-04-25 DIAGNOSIS — R064 Hyperventilation: Secondary | ICD-10-CM | POA: Diagnosis not present

## 2019-04-25 DIAGNOSIS — M5489 Other dorsalgia: Secondary | ICD-10-CM | POA: Diagnosis not present

## 2019-04-25 DIAGNOSIS — R079 Chest pain, unspecified: Secondary | ICD-10-CM | POA: Diagnosis not present

## 2019-04-25 LAB — COMPREHENSIVE METABOLIC PANEL
ALT: 27 U/L (ref 0–44)
AST: 27 U/L (ref 15–41)
Albumin: 3.8 g/dL (ref 3.5–5.0)
Alkaline Phosphatase: 74 U/L (ref 38–126)
Anion gap: 10 (ref 5–15)
BUN: 11 mg/dL (ref 6–20)
CO2: 21 mmol/L — ABNORMAL LOW (ref 22–32)
Calcium: 9.4 mg/dL (ref 8.9–10.3)
Chloride: 110 mmol/L (ref 98–111)
Creatinine, Ser: 0.79 mg/dL (ref 0.44–1.00)
GFR calc Af Amer: >60 mL/min (ref >60)
GFR calc non Af Amer: >60 mL/min (ref >60)
Glucose, Bld: 105 mg/dL — ABNORMAL HIGH (ref 70–99)
Potassium: 4.5 mmol/L (ref 3.5–5.1)
Sodium: 141 mmol/L (ref 135–145)
Total Bilirubin: 0.9 mg/dL (ref 0.3–1.2)
Total Protein: 6.4 g/dL — ABNORMAL LOW (ref 6.5–8.1)

## 2019-04-25 LAB — CBC WITH DIFFERENTIAL/PLATELET
Abs Immature Granulocytes: 0.01 10*3/uL (ref 0.00–0.07)
Basophils Absolute: 0 10*3/uL (ref 0.0–0.1)
Basophils Relative: 1 %
Eosinophils Absolute: 0.1 10*3/uL (ref 0.0–0.5)
Eosinophils Relative: 3 %
HCT: 39.4 % (ref 36.0–46.0)
Hemoglobin: 12.9 g/dL (ref 12.0–15.0)
Immature Granulocytes: 0 %
Lymphocytes Relative: 52 %
Lymphs Abs: 2.2 10*3/uL (ref 0.7–4.0)
MCH: 29.1 pg (ref 26.0–34.0)
MCHC: 32.7 g/dL (ref 30.0–36.0)
MCV: 88.9 fL (ref 80.0–100.0)
Monocytes Absolute: 0.4 10*3/uL (ref 0.1–1.0)
Monocytes Relative: 9 %
Neutro Abs: 1.5 10*3/uL — ABNORMAL LOW (ref 1.7–7.7)
Neutrophils Relative %: 35 %
Platelets: 270 10*3/uL (ref 150–400)
RBC: 4.43 MIL/uL (ref 3.87–5.11)
RDW: 12.5 % (ref 11.5–15.5)
WBC: 4.3 10*3/uL (ref 4.0–10.5)
nRBC: 0 % (ref 0.0–0.2)

## 2019-04-25 LAB — LIPASE, BLOOD: Lipase: 37 U/L (ref 11–51)

## 2019-04-25 LAB — D-DIMER, QUANTITATIVE: D-Dimer, Quant: 2.05 ug/mL-FEU — ABNORMAL HIGH (ref 0.00–0.50)

## 2019-04-25 LAB — TROPONIN I
Troponin I: <0.03 ng/mL (ref <0.03)
Troponin I: <0.03 ng/mL (ref <0.03)

## 2019-04-25 LAB — SARS CORONAVIRUS 2 BY RT PCR (HOSPITAL ORDER, PERFORMED IN ~~LOC~~ HOSPITAL LAB): SARS Coronavirus 2: NEGATIVE

## 2019-04-25 MED ORDER — NITROGLYCERIN 0.4 MG SL SUBL
0.4000 mg | SUBLINGUAL_TABLET | SUBLINGUAL | Status: DC | PRN
  Administered 2019-04-25 (×2): 0.4 mg via SUBLINGUAL
  Filled 2019-04-25: qty 1

## 2019-04-25 MED ORDER — IOHEXOL 350 MG/ML SOLN
100.0000 mL | Freq: Once | INTRAVENOUS | Status: AC | PRN
  Administered 2019-04-25: 100 mL via INTRAVENOUS

## 2019-04-25 NOTE — ED Triage Notes (Addendum)
Had back and shoulder pain last night and roday had cp pressure on the way to work , was tearful and hyperventlating upon ems arrival to scene  Had some  Nausea yesterday and today states had mouth work done yesterday states took an [place] yesterday after and broke out in hives states took self off her HTN med did not tell dr

## 2019-04-25 NOTE — ED Notes (Signed)
Patient transported to CT 

## 2019-04-25 NOTE — ED Provider Notes (Signed)
Emergency Department Provider Note   I have reviewed the triage vital signs and the nursing notes.   HISTORY  Chief Complaint Chest Pain and Back Pain   HPI Rebecca [name] is a 48 y.o. female with PMH of anemia, DVT in pregnancy, Esophagitis, HTN, and HLD presents to the emergency department for evaluation of shoulder and chest discomfort starting yesterday.  Patient had dental work done at 7 AM yesterday.  She noticed some bilateral shoulder discomfort during that appointment.  She noticed the pain throughout the day and later in the afternoon/evening timeframe yesterday she developed central chest pressure.  Symptoms became severe overnight and has had constant pain for most of the night and early morning.  Patient was unable to relieve her symptoms at home.  She did have some nausea but no vomiting.  She had one small episode of diarrhea.  She denies any abdominal pain.  No shortness of breath.  No pleuritic discomfort.  Patient also notes taking amoxicillin yesterday after her dental procedure.  She had outbreak of rash and itching which did resolve.  She has not taken additional antibiotic this morning.  Denies any fevers.    Past Medical History:  Diagnosis Date  . Anemia   . Colon polyp 12/25/2011   Tubular adenomatous  . Depression    following death of mother  . Duodenitis   . 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 Esophagitis   . Fatigue   . Gallstones   . GERD (gastroesophageal reflux disease)   . Hemorrhagic ovarian cyst 05/22/2012   04/2012   . Infection    UTI  . Neuromuscular disorder ([place])    hiatal hernia  . Obesity   . PUD (peptic ulcer disease)   . Sacroiliac pain   . Vaginal Pap smear, abnormal    colpo, results ok  . Vitamin D deficiency     Patient Active Problem List   Diagnosis Date Noted  . Chronic pain of left knee 07/03/2018  . Onychomycosis 05/17/2018  . Tinea pedis of both feet 05/17/2018  . Prediabetes 11/16/2014  . Obesity  11/16/2014  . Essential hypertension 11/16/2014  . Mixed hyperlipidemia 10/06/2013  . Cholelithiasis 05/22/2012  . Adenomatous colon polyp 01/15/2012  . Constipation 12/12/2011  . Vitamin D deficiency 12/12/2011  . Depression, major, recurrent, in partial remission ([place]) 12/12/2011  . GERD (gastroesophageal reflux disease) 01/24/2007    Past Surgical History:  Procedure Laterality Date  . BREAST EXCISIONAL BIOPSY Left    benign  . BREAST LUMPECTOMY Left    benign  . COLPOSCOPY  2012   NEG  . DILATATION & CURETTAGE/HYSTEROSCOPY WITH MYOSURE N/A 01/17/2017   Procedure: [place];  Surgeon: Servando Salina, MD;  Location: [facility];  Service: Gynecology;  Laterality: N/A;  . TUBAL LIGATION      Allergies Penicillins  Family History  Problem Relation Age of Onset  . Aneurysm Mother   . Heart disease Mother   . Hypertension Mother   . Stroke Mother   . Lupus Mother   . Diabetes Maternal Aunt   . Cancer Maternal Aunt   . Colon cancer Neg Hx     Social History Social History   Tobacco Use  . Smoking status: Current Some Day Smoker    Packs/day: 0.10    Years: 5.00    Pack years: 0.50    Types: Cigarettes  . Smokeless tobacco: Never Used  Substance Use Topics  . Alcohol  use: Yes    Alcohol/week: 0.0 standard drinks    Comment: occasional glass of wine  . Drug use: No    Review of Systems  Constitutional: No fever/chills Eyes: No visual changes. ENT: No sore throat. Cardiovascular: Positive chest pain/pressure. Respiratory: Denies shortness of breath. Gastrointestinal: No abdominal pain. Positive nausea, no vomiting. Positive diarrhea.  No constipation. Genitourinary: Negative for dysuria. Musculoskeletal: Negative for back pain. Skin: Rash yesterday after abx (resolved).  Neurological: Negative for headaches, focal weakness or numbness.  10-point ROS otherwise negative.  ____________________________________________    PHYSICAL EXAM:  VITAL SIGNS: Vitals:   04/25/19 1530 04/25/19 1800  BP: 135/83 121/72  Pulse: 81 74  Resp: 20 13  SpO2: 100% 99%    Constitutional: Alert and oriented. Well appearing and in no acute distress. Eyes: Conjunctivae are normal.  Head: Atraumatic. Nose: No congestion/rhinnorhea. Mouth/Throat: Mucous membranes are moist.   Neck: No stridor. Cardiovascular: Normal rate, regular rhythm. Good peripheral circulation. Grossly normal heart sounds. No murmurs.  Respiratory: Normal respiratory effort.  No retractions. Lungs CTAB. Gastrointestinal: Soft and nontender. No distention.  Musculoskeletal:  No gross deformities of extremities. Neurologic:  Normal speech and language.  Skin:  Skin is warm, dry and intact. No rash noted.  ____________________________________________   LABS (all labs ordered are listed, but only abnormal results are displayed)  Labs Reviewed  COMPREHENSIVE METABOLIC PANEL - Abnormal; Notable for the following components:      Result Value   CO2 21 (*)    Glucose, Bld 105 (*)    Total Protein 6.4 (*)    All other components within normal limits  CBC WITH DIFFERENTIAL/PLATELET - Abnormal; Notable for the following components:   Neutro Abs 1.5 (*)    All other components within normal limits  D-DIMER, QUANTITATIVE (NOT AT [facility]) - Abnormal; Notable for the following components:   D-Dimer, Quant 2.05 (*)    All other components within normal limits  SARS CORONAVIRUS 2 (HOSPITAL ORDER, [place] [facility])  LIPASE, BLOOD  TROPONIN I  TROPONIN I   ____________________________________________  EKG   EKG Interpretation  Date/Time:  Friday Apr 25 2019 11:21:51 EDT Ventricular Rate:  93 PR Interval:    QRS Duration: 85 QT Interval:  354 QTC Calculation: 441 R Axis:   51 Text Interpretation:  Sinus rhythm No STEMI  Confirmed by Nanda Quinton [phone]) on 04/25/2019 11:27:52 AM        ____________________________________________  RADIOLOGY  Ct Angio Chest Pe W And/or Wo Contrast  Result Date: 04/25/2019 CLINICAL DATA:  Shortness of breath.  Mid chest tightness EXAM: CT ANGIOGRAPHY CHEST WITH CONTRAST TECHNIQUE: Multidetector CT imaging of the chest was performed using the standard protocol during bolus administration of intravenous contrast. Multiplanar CT image reconstructions and MIPs were obtained to evaluate the vascular anatomy. CONTRAST:  128mL OMNIPAQUE IOHEXOL 350 MG/ML SOLN COMPARISON:  Multiple exams, including 04/25/2019 chest radiograph and chest CT from 01/13/2012 FINDINGS: Cardiovascular: No filling defect is identified in the pulmonary arterial tree to suggest pulmonary embolus. Mild cardiomegaly. Mediastinum/Nodes: Small type 1 hiatal hernia. Lungs/Pleura: Linear subsegmental atelectasis or scarring in the lingula. Upper Abdomen: Unremarkable Musculoskeletal: Mild midthoracic spondylosis. Review of the MIP images confirms the above findings. IMPRESSION: 1. No filling defect is identified in the pulmonary arterial tree to suggest pulmonary embolus. 2. Small type 1 hiatal hernia. Electronically Signed   By: Van Clines M.D.   On: 04/25/2019 16:07   Dg Chest Portable 1 View  Result Date:  04/25/2019 CLINICAL DATA:  Chest pain EXAM: PORTABLE CHEST 1 VIEW COMPARISON:  01/13/2012 FINDINGS: The heart size and mediastinal contours are within normal limits. Both lungs are clear. The visualized skeletal structures are unremarkable. IMPRESSION: No active disease. Electronically Signed   By: Franchot Gallo M.D.   On: 04/25/2019 12:54    ____________________________________________   PROCEDURES  Procedure(s) performed:   Procedures  None  ____________________________________________   INITIAL IMPRESSION / ASSESSMENT AND PLAN / ED COURSE  Pertinent labs & imaging results that were available during my care of the patient were reviewed by me and considered in my  medical decision making (see chart for details).   Patient presents to the emergency department with chest pain/pressure which is been constant over the last 14 to 16 hours.  She did have some associated nausea and one episode of diarrhea.  She had dental work done yesterday but very low suspicion for endocarditis.  She did take antibiotics, although did have reaction, and is not experiencing any fevers or chills.  No hypotension.  No murmur on exam.  Patient does have history of DVT but was thought to be provoked by pregnancy.  Chest pain symptoms not typical of PE but will obtain d-dimer as the patient is 48 and unable to apply PERC rule due to age. ASA given by EMS. Will try Nitro here. HEART score 4.   01:10 PM  Updated patient. Troponin and CMP pending. D dimer is elevated here. Has history of DVT in pregnancy. Discussed with patient that we will be obtaining a CTA of the chest to evaluate for PE. CP resolved with Nitro.   01:55 PM  Called to speak with lab about result delay. Tubes found and slightly hemolyzed. ED not notified of hemolyzed sample. They will run the samples.   Labs reviewed including troponin x 2. Pain resolved. Plan for Cardiology follow up. CTA negative for PE. Discussed strict ED return precautions.  ____________________________________________  FINAL CLINICAL IMPRESSION(S) / ED DIAGNOSES  Final diagnoses:  Precordial chest pain    MEDICATIONS GIVEN DURING THIS VISIT:  Medications  iohexol (OMNIPAQUE) 350 MG/ML injection 100 mL (100 mLs Intravenous Contrast Given 04/25/19 1558)    Note:  This document was prepared using Dragon voice recognition software and may include unintentional dictation errors.  Nanda Quinton, MD Emergency Medicine    Long, Wonda Old[facility], MD 04/26/19 [phone]

## 2019-04-25 NOTE — Discharge Instructions (Signed)

## 2019-05-09 ENCOUNTER — Telehealth: Payer: Self-pay

## 2019-05-09 NOTE — Telephone Encounter (Signed)
Called pt to set up evisit. Unable to leave voicemail.  

## 2019-05-09 NOTE — Telephone Encounter (Signed)
Virtual Visit Pre-Appointment Phone Call TELEPHONE CALL NOTE  Rebecca [name] has been deemed a candidate for a follow-up tele-health visit to limit community exposure during the Covid-19 pandemic. I spoke with the patient via phone to ensure availability of phone/video source, confirm preferred email & phone number, and discuss instructions and expectations.  I reminded Rebecca [name] to be prepared with any vital sign and/or heart rhythm information that could potentially be obtained via home monitoring, at the time of her visit. I reminded Rebecca [name] to expect a phone call prior to her visit.  Patient agrees to consent below.  Rebecca Gustin, RN 05/09/2019 3:18 PM   FULL LENGTH CONSENT FOR TELE-HEALTH VISIT   I hereby voluntarily request, consent and authorize [facility] and its employed or contracted physicians, physician assistants, nurse practitioners or other licensed health care professionals (the Practitioner), to provide me with telemedicine health care services (the "Services") as deemed necessary by the treating Practitioner. I acknowledge and consent to receive the Services by the Practitioner via telemedicine. I understand that the telemedicine visit will involve communicating with the Practitioner through live audiovisual communication technology and the disclosure of certain medical information by electronic transmission. I acknowledge that I have been given the opportunity to request an in-person assessment or other available alternative prior to the telemedicine visit and am voluntarily participating in the telemedicine visit.  I understand that I have the right to withhold or withdraw my consent to the use of telemedicine in the course of my care at any time, without affecting my right to future care or treatment, and that the Practitioner or I may terminate the telemedicine visit at any time. I understand that I have the right to inspect all information  obtained and/or recorded in the course of the telemedicine visit and may receive copies of available information for a reasonable fee.  I understand that some of the potential risks of receiving the Services via telemedicine include:  Marland Kitchen Delay or interruption in medical evaluation due to technological equipment failure or disruption; . Information transmitted may not be sufficient (e.g. poor resolution of images) to allow for appropriate medical decision making by the Practitioner; and/or  . In rare instances, security protocols could fail, causing a breach of personal health information.  Furthermore, I acknowledge that it is my responsibility to provide information about my medical history, conditions and care that is complete and accurate to the best of my ability. I acknowledge that Practitioner's advice, recommendations, and/or decision may be based on factors not within their control, such as incomplete or inaccurate data provided by me or distortions of diagnostic images or specimens that may result from electronic transmissions. I understand that the practice of medicine is not an exact science and that Practitioner makes no warranties or guarantees regarding treatment outcomes. I acknowledge that I will receive a copy of this consent concurrently upon execution via email to the email address I last provided but may also request a printed copy by calling the office of [facility].    I understand that my insurance will be billed for this visit.   I have read or had this consent read to me. . I understand the contents of this consent, which adequately explains the benefits and risks of the Services being provided via telemedicine.  . I have been provided ample opportunity to ask questions regarding this consent and the Services and have had my questions answered to my satisfaction. . I give  my informed consent for the services to be provided through the use of telemedicine in my medical care   By participating in this telemedicine visit I agree to the above.

## 2019-05-12 ENCOUNTER — Telehealth (INDEPENDENT_AMBULATORY_CARE_PROVIDER_SITE_OTHER): Payer: 59 | Admitting: Interventional Cardiology

## 2019-05-12 ENCOUNTER — Other Ambulatory Visit: Payer: Self-pay

## 2019-05-12 ENCOUNTER — Encounter: Payer: Self-pay | Admitting: Interventional Cardiology

## 2019-05-12 VITALS — Ht 66.5 in | Wt 240.0 lb

## 2019-05-12 DIAGNOSIS — F17200 Nicotine dependence, unspecified, uncomplicated: Secondary | ICD-10-CM | POA: Diagnosis not present

## 2019-05-12 DIAGNOSIS — R072 Precordial pain: Secondary | ICD-10-CM | POA: Diagnosis not present

## 2019-05-12 NOTE — Progress Notes (Signed)
Virtual Visit via Video Note   This visit type was conducted due to national recommendations for restrictions regarding the COVID-19 Pandemic (e.g. social distancing) in an effort to limit this patient's exposure and mitigate transmission in our community.  Due to her co-morbid illnesses, this patient is at least at moderate risk for complications without adequate follow up.  This format is felt to be most appropriate for this patient at this time.  All issues noted in this document were discussed and addressed.  A limited physical exam was performed with this format.  Please refer to the patient's chart for her consent to telehealth for [facility].   Date:  05/12/2019   ID:  Rebecca [name], DOB 12-Sep-1971, MRN [id number]  Patient Location: Home Provider Location: Home  PCP:  Patient, No Pcp Per  Cardiologist:  No primary care provider on file. [place] Electrophysiologist:  None   Evaluation Performed:  Consultation - Rebecca [name] was referred by [facility] and Dr. Garwin Brothers for the evaluation of chest pressure.  Chief Complaint:  Chest pain  History of Present Illness:    Rebecca [name] is a 48 y.o. female with an episode chest pressure in 03/2019.  She has had anxiety atacks in the past.  She went to the ER with this episode.  History was as follows: "with PMH of anemia, DVT in pregnancy, Esophagitis, HTN, and HLD presents to the emergency department for evaluation of shoulder and chest discomfort starting yesterday.  Patient had dental work done at 7 AM yesterday.  She noticed some bilateral shoulder discomfort during that appointment.  She noticed the pain throughout the day and later in the afternoon/evening timeframe yesterday she developed central chest pressure.  Symptoms became severe overnight and has had constant pain for most of the night and early morning.  Patient was unable to relieve her symptoms at home.  She did have some nausea but no vomiting.  She had one small  episode of diarrhea.  She denies any abdominal pain.  No shortness of breath.  No pleuritic discomfort.  Patient also notes taking amoxicillin yesterday after her dental procedure.  She had outbreak of rash and itching which did resolve.  She has not taken additional antibiotic this morning.  Denies any fevers. "  She was in the midst of dental work.   Since then, she has not noted any triggers of chest pressure.    She is not walking regularly.   Since the episode in the ER: Denies : Chest pain. Dizziness. Leg edema. Nitroglycerin use. Orthopnea. Palpitations. Paroxysmal nocturnal dyspnea. Shortness of breath. Syncope.   The patient does not have symptoms concerning for COVID-19 infection (fever, chills, cough, or new shortness of breath).    Past Medical History:  Diagnosis Date  . Anemia   . Colon polyp 12/25/2011   Tubular adenomatous  . Depression    following death of mother  . Duodenitis   . 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 Esophagitis   . Fatigue   . Gallstones   . GERD (gastroesophageal reflux disease)   . Hemorrhagic ovarian cyst 05/22/2012   04/2012   . Infection    UTI  . Neuromuscular disorder ([place])    hiatal hernia  . Obesity   . PUD (peptic ulcer disease)   . Sacroiliac pain   . Vaginal Pap smear, abnormal    colpo, results ok  . Vitamin D deficiency    Past Surgical History:  Procedure  Laterality Date  . BREAST EXCISIONAL BIOPSY Left    benign  . BREAST LUMPECTOMY Left    benign  . COLPOSCOPY  2012   NEG  . DILATATION & CURETTAGE/HYSTEROSCOPY WITH MYOSURE N/A 01/17/2017   Procedure: [place];  Surgeon: Servando Salina, MD;  Location: [facility];  Service: Gynecology;  Laterality: N/A;  . TUBAL LIGATION       Current Meds  Medication Sig  . DULoxetine (CYMBALTA) 60 MG capsule TAKE 1 CAPSULE BY MOUTH DAILY.  . Multiple Vitamin (MULTIVITAMIN WITH MINERALS) TABS tablet Take 1 tablet by mouth daily.      Allergies:   Penicillins   Social History   Tobacco Use  . Smoking status: Current Some Day Smoker    Packs/day: 0.10    Years: 5.00    Pack years: 0.50    Types: Cigarettes  . Smokeless tobacco: Never Used  Substance Use Topics  . Alcohol use: Yes    Alcohol/week: 0.0 standard drinks    Comment: occasional glass of wine  . Drug use: No     Family Hx: The patient's family history includes Aneurysm in her mother; Cancer in her maternal aunt; Diabetes in her maternal aunt; Heart disease in her mother; Hypertension in her mother; Lupus in her mother; Stroke in her mother. There is no history of Colon cancer.  ROS:   Please see the history of present illness.    Prior anxiety All other systems reviewed and are negative.   Prior CV studies:   The following studies were reviewed today:  CT scan: In 5/29: No PE, no coronary calcium  Labs/Other Tests and Data Reviewed:    EKG:  An ECG dated 04/25/19 was personally reviewed today and demonstrated:  Normal ECG  Recent Labs: 04/25/2019: ALT 27; BUN 11; Creatinine, Ser 0.79; Hemoglobin 12.9; Platelets 270; Potassium 4.5; Sodium 141   Recent Lipid Panel Lab Results  Component Value Date/Time   CHOL 183 09/27/2017 04:19 PM   TRIG 152 (H) 09/27/2017 04:19 PM   HDL 50 (L) 09/27/2017 04:19 PM   CHOLHDL 3.7 09/27/2017 04:19 PM   LDLCALC 106 (H) 09/27/2017 04:19 PM    Wt Readings from Last 3 Encounters:  05/12/19 240 lb (108.9 kg)  02/04/18 239 lb (108.4 kg)  09/27/17 232 lb (105.2 kg)     Objective:    Vital Signs:  Ht 5' 6.5" (1.689 m)   Wt 240 lb (108.9 kg)   LMP 08/13/2013 Comment: negative urine pregnancy test 12-2013  BMI 38.16 kg/m    VITAL SIGNS:  reviewed GEN:  no acute distress RESPIRATORY:  normal respiratory effort, symmetric expansion PSYCH:  normal affect exam limited by video format  ASSESSMENT & PLAN:    1. Chest pain:   RF for CAD including smoking. We spoke about improving lifestyle.   Given RF  for CAD, will plan for coronary CT.  2. Tobacco abuse: She needs to stop smoking.  She has smoked for several years.  3. 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  COVID-19 Education: The signs and symptoms of COVID-19 were discussed with the patient and how to seek care for testing (follow up with PCP or arrange E-visit).  The importance of social distancing was discussed today.  Time:   Today, I have spent 30 minutes with the patient with telehealth technology discussing the above problems.     Medication Adjustments/Labs and Tests Ordered: Current medicines are reviewed at length  with the patient today.  Concerns regarding medicines are outlined above.   Tests Ordered: No orders of the defined types were placed in this encounter.   Medication Changes: No orders of the defined types were placed in this encounter.   Follow Up:  Virtual Visit or In Person prn based on result  Signed, Larae Grooms, MD  05/12/2019 10:50 AM    [place]

## 2019-05-12 NOTE — Patient Instructions (Addendum)
Medication Instructions:  Your physician recommends that you continue on your current medications as directed. Please refer to the Current Medication list given to you today.  If you need a refill on your cardiac medications before your next appointment, please call your pharmacy.   Lab work: None Ordered  If you have labs (blood work) drawn today and your tests are completely normal, you will receive your results only by: Marland Kitchen MyChart Message (if you have MyChart) OR . A paper copy in the mail If you have any lab test that is abnormal or we need to change your treatment, we will call you to review the results.  Testing/Procedures: Your physician has requested that you have cardiac CT. Cardiac computed tomography (CT) is a painless test that uses an x-ray machine to take clear, detailed pictures of your heart. For further information please visit [email or web address]. Please follow instruction sheet as given.   Follow-Up: . Based on test results  Any Other Special Instructions Will Be Listed Below (If Applicable).  CALL BACK TO REPORT YOUR HEART RATE AND WE WILL DETERMINE THE APPROPRIATE METOPROLOL DOSE TO GIVE YOU TO TAKE BEFORE YOUR CARDIAC CT  CARDIAC CT INSTRUCTIONS  Please arrive at the [facility] main entrance of [facility] on ___ at ___ (30-45 minutes prior to test start time)  [facility] [place], [place] [address] [phone]  Proceed to the [facility] Radiology Department (First Floor).  Please follow these instructions carefully (unless otherwise directed):   On the Night Before the Test: . Be sure to Drink plenty of water. . Do not consume any caffeinated/decaffeinated beverages or chocolate 12 hours prior to your test. . Do not take any antihistamines 12 hours prior to your test.  On the Day of the Test: . Drink plenty of water. Do not drink any water within one hour of the test. . Do not eat any food 4 hours prior to the  test. . You may take your regular medications prior to the test.  . Take metoprolol (Lopressor) two hours prior to test.       After the Test: . Drink plenty of water. . After receiving IV contrast, you may experience a mild flushed feeling. This is normal. . On occasion, you may experience a mild rash up to 24 hours after the test. This is not dangerous. If this occurs, you can take Benadryl 25 mg and increase your fluid intake. . If you experience trouble breathing, this can be serious. If it is severe call 911 IMMEDIATELY. If it is mild, please call our office.

## 2019-05-13 ENCOUNTER — Telehealth: Payer: Self-pay | Admitting: Interventional Cardiology

## 2019-05-13 NOTE — Telephone Encounter (Signed)
Follow Up:; ° ° °Returning your call. °

## 2019-05-13 NOTE — Telephone Encounter (Signed)
Follow Up:      Pt said she was supposed to.he

## 2019-05-13 NOTE — Telephone Encounter (Signed)
New Message:    Pt said she was supposed to let the nurse know her heart rate today, it was 92.

## 2019-05-15 MED ORDER — METOPROLOL TARTRATE 100 MG PO TABS: ORAL_TABLET | ORAL | 0 refills | Status: DC

## 2019-05-15 NOTE — Telephone Encounter (Signed)
Returned call to patient. Patient was calling to report HR that way we could determine appropriate dose for metoprolol prior to her Cardiac CT. Patient's HR 92 bpm. Made patient aware that she will need to take metoprolol 100 mg x1 two hours prior to Cardiac CT. Patient verbalized understanding and thanked me for the call.

## 2019-05-16 LAB — HM PAP SMEAR

## 2019-05-21 ENCOUNTER — Other Ambulatory Visit: Payer: Self-pay

## 2019-05-21 ENCOUNTER — Ambulatory Visit
Admission: RE | Admit: 2019-05-21 | Discharge: 2019-05-21 | Disposition: A | Discharge status: Home / Self Care | Payer: 59 | Source: Ambulatory Visit | Attending: Specialist | Admitting: Specialist

## 2019-05-21 DIAGNOSIS — Z1231 Encounter for screening mammogram for malignant neoplasm of breast: Secondary | ICD-10-CM

## 2019-05-22 ENCOUNTER — Encounter: Payer: Self-pay | Admitting: Interventional Cardiology

## 2019-05-26 ENCOUNTER — Other Ambulatory Visit: Payer: Self-pay

## 2019-05-26 ENCOUNTER — Telehealth (INDEPENDENT_AMBULATORY_CARE_PROVIDER_SITE_OTHER): Payer: 59 | Admitting: Nurse Practitioner

## 2019-05-26 ENCOUNTER — Encounter: Payer: Self-pay | Admitting: Nurse Practitioner

## 2019-05-26 VITALS — Ht 66.0 in | Wt 228.0 lb

## 2019-05-26 DIAGNOSIS — B351 Tinea unguium: Secondary | ICD-10-CM | POA: Diagnosis not present

## 2019-05-26 DIAGNOSIS — B353 Tinea pedis: Secondary | ICD-10-CM

## 2019-05-26 MED ORDER — MICONAZOLE NITRATE 2 % EX POWD: Freq: Every day | CUTANEOUS | 0 refills | Status: DC

## 2019-05-26 MED ORDER — TERBINAFINE HCL 250 MG PO TABS: 250.0000 mg | ORAL_TABLET | Freq: Every day | ORAL | 0 refills | Status: DC

## 2019-05-26 NOTE — Progress Notes (Signed)
Virtual Visit via Video Note  I connected with Rebecca [name] on 05/26/19 at  8:00 AM EDT by a video enabled telemedicine application and verified that I am speaking with the correct person using two identifiers.  Location: Patient: Home Provider: Office   I discussed the limitations of evaluation and management by telemedicine and the availability of in person appointments. The patient expressed understanding and agreed to proceed.  CC: rash on toenails, palms and between toes.  History of Present Illness: Rash This is a chronic problem. The current episode started more than 1 year ago. The problem has been waxing and waning since onset. The affected locations include the left foot, right foot, left toes, left hand and right hand. The rash is characterized by scaling, itchiness and peeling. She was exposed to nothing. Associated symptoms include nail changes. Pertinent negatives include no fever or joint pain. Past treatments include anti-itch cream (antifungal powder). The treatment provided no relief.  never used lamisil previous prescribed by podiatry. Admits to doing vinegar soak once every 2weeks.   reviewed labs done 03/2019: normal hepatic panel and cbc.  Observations/Objective: Physical Exam  Constitutional: No distress.  Pulmonary/Chest: Effort normal.  Skin: Rash noted. Rash is macular.     Scaly patches on palms, dorsal surface of feet and between toes. No erythema.    Assessment and [name]: Rebecca [name] was seen today for establish care.  Diagnoses and all orders for this visit:  Tinea pedis of both feet -     miconazole (MICOTIN) 2 % powder; Apply topically daily. -     terbinafine (LAMISIL) 250 MG tablet; Take 1 tablet (250 mg total) by mouth daily.  Onychomycosis -     miconazole (MICOTIN) 2 % powder; Apply topically daily. -     terbinafine (LAMISIL) 250 MG tablet; Take 1 tablet (250 mg total) by mouth daily.    Follow Up Instructions: Use vinegar and water  soak, and Zeasorb powder daily. Start lamisil tab after cardiac procedure. Labs need to be repeated in 4weeks. Avoid OTC medications like acetaminophen and use of ETOH.   I discussed the assessment and treatment [name] with the patient. The patient was provided an opportunity to ask questions and all were answered. The patient agreed with the [name] and demonstrated an understanding of the instructions.   The patient was advised to call back or seek an in-person evaluation if the symptoms worsen or if the condition fails to improve as anticipated.   Wilfred Lacy, NP

## 2019-05-26 NOTE — Patient Instructions (Signed)
Use vinegar and water soak, and Zeasorb powder daily. Start lamisil tab after cardiac procedure. Labs need to be repeated in 4weeks. Avoid OTC medications like acetaminophen and use of ETOH.  Terbinafine tablets What is this medicine? TERBINAFINE (TER bin a feen) is an antifungal medicine. It is used to treat certain kinds of fungal or yeast infections. This medicine may be used for other purposes; ask your health care provider or pharmacist if you have questions. COMMON BRAND NAME(S): Lamisil, Terbinex What should I tell my health care provider before I take this medicine? They need to know if you have any of these conditions:  drink alcoholic beverages  kidney disease  liver disease  an unusual or allergic reaction to terbinafine, other medicines, foods, dyes, or preservatives  pregnant or trying to get pregnant  breast-feeding How should I use this medicine? Take this medicine by mouth with a full glass of water. Follow the directions on the prescription label. You can take this medicine with food or on an empty stomach. Take your medicine at regular intervals. Do not take your medicine more often than directed. Do not skip doses or stop your medicine early even if you feel better. Do not stop taking except on your doctor's advice. Talk to your pediatrician regarding the use of this medicine in children. Special care may be needed. Overdosage: If you think you have taken too much of this medicine contact a poison control center or emergency room at once. NOTE: This medicine is only for you. Do not share this medicine with others. What if I miss a dose? If you miss a dose, take it as soon as you can. If it is almost time for your next dose, take only that dose. Do not take double or extra doses. What may interact with this medicine? Do not take this medicine with any of the following medications:  thioridazine This medicine may also interact with the following  medications:  beta-blockers  caffeine  cimetidine  cyclosporine  medicines for depression, anxiety, or psychotic disturbances  medicines for fungal infections like fluconazole and ketoconazole  medicines for irregular heartbeat like amiodarone, flecainide and propafenone  rifampin  warfarin This list may not describe all possible interactions. Give your health care provider a list of all the medicines, herbs, non-prescription drugs, or dietary supplements you use. Also tell them if you smoke, drink alcohol, or use illegal drugs. Some items may interact with your medicine. What should I watch for while using this medicine? Visit your doctor or health care provider regularly. Tell your doctor right away if you have nausea or vomiting, loss of appetite, stomach pain on your right upper side, yellow skin, dark urine, light stools, or are over tired. Some fungal infections need many weeks or months of treatment to cure. If you are taking this medicine for a long time, you will need to have important blood work done. This medicine may cause serious skin reactions. They can happen weeks to months after starting the medicine. Contact your health care provider right away if you notice fevers or flu-like symptoms with a rash. The rash may be red or purple and then turn into blisters or peeling of the skin. Or, you might notice a red rash with swelling of the face, lips or lymph nodes in your neck or under your arms. What side effects may I notice from receiving this medicine? Side effects that you should report to your doctor or health care professional as soon as possible:  allergic reactions like skin rash or hives, swelling of the face, lips, or tongue  changes in vision  dark urine  fever or infection  general ill feeling or flu-like symptoms  light-colored stools  loss of appetite, nausea  rash, fever, and swollen lymph nodes  redness, blistering, peeling or loosening of the  skin, including inside the mouth  right upper belly pain  unusually weak or tired  yellowing of the eyes or skin Side effects that usually do not require medical attention (report to your doctor or health care professional if they continue or are bothersome):  changes in taste  diarrhea  hair loss  muscle or joint pain  stomach gas  stomach upset This list may not describe all possible side effects. Call your doctor for medical advice about side effects. You may report side effects to FDA at 1-800-FDA-1088. Where should I keep my medicine? Keep out of the reach of children. Store at room temperature below 25 degrees C (77 degrees F). Protect from light. Throw away any unused medicine after the expiration date. NOTE: This sheet is a summary. It may not cover all possible information. If you have questions about this medicine, talk to your doctor, pharmacist, or health care provider.  2020 Elsevier/Gold Standard (2019-02-21 15:37:07)

## 2019-06-09 ENCOUNTER — Telehealth (HOSPITAL_COMMUNITY): Payer: Self-pay | Admitting: Emergency Medicine

## 2019-06-09 NOTE — Telephone Encounter (Signed)
Reaching out to patient to offer assistance regarding upcoming cardiac imaging study; pt verbalizes understanding of appt date/time, parking situation and where to check in, pre-test NPO status and medications ordered, and verified current allergies; name and call back number provided for further questions should they arise Mehek Grega RN Navigator Cardiac Imaging [facility] [phone] office [phone] cell  Pt denies covid symptoms, verbalized understanding of visitor policy. 

## 2019-06-10 ENCOUNTER — Other Ambulatory Visit: Payer: Self-pay

## 2019-06-10 ENCOUNTER — Ambulatory Visit (HOSPITAL_COMMUNITY): Payer: 59

## 2019-06-10 ENCOUNTER — Ambulatory Visit (HOSPITAL_COMMUNITY)
Admission: RE | Admit: 2019-06-10 | Discharge: 2019-06-10 | Disposition: A | Discharge status: Home / Self Care | Payer: 59 | Source: Ambulatory Visit | Attending: Interventional Cardiology | Admitting: Interventional Cardiology

## 2019-06-10 ENCOUNTER — Encounter (HOSPITAL_COMMUNITY): Payer: Self-pay

## 2019-06-10 DIAGNOSIS — R072 Precordial pain: Secondary | ICD-10-CM | POA: Diagnosis present

## 2019-06-10 MED ORDER — IOHEXOL 350 MG/ML SOLN
80.0000 mL | Freq: Once | INTRAVENOUS | Status: AC | PRN
  Administered 2019-06-10: 80 mL via INTRAVENOUS

## 2019-06-10 MED ORDER — NITROGLYCERIN 0.4 MG SL SUBL
0.8000 mg | SUBLINGUAL_TABLET | Freq: Once | SUBLINGUAL | Status: AC
  Administered 2019-06-10: 0.8 mg via SUBLINGUAL
  Filled 2019-06-10: qty 25

## 2019-06-10 MED ORDER — NITROGLYCERIN 0.4 MG SL SUBL
SUBLINGUAL_TABLET | SUBLINGUAL | Status: AC
  Filled 2019-06-10: qty 2

## 2019-06-13 ENCOUNTER — Telehealth: Payer: Self-pay | Admitting: Interventional Cardiology

## 2019-06-13 NOTE — Telephone Encounter (Signed)
I reviewed this yesterday and sent to [place].  I think she is off.    No CAD.  Non cardiac chest pain.

## 2019-06-13 NOTE — Telephone Encounter (Signed)
Pt was informed of cardiac ct results,

## 2019-06-13 NOTE — Telephone Encounter (Signed)
Pt was informed that the DR/Nurse have not reviewed results yet and will hear back next week. Pt was agreeable to plan.

## 2019-06-13 NOTE — Telephone Encounter (Signed)
New message   Patient states that she is returning call for CT results. Please call.

## 2019-07-25 ENCOUNTER — Telehealth: Payer: Self-pay | Admitting: Nurse Practitioner

## 2019-07-25 NOTE — Telephone Encounter (Signed)
Pt has an appt with the lab on 07/29/2019 but order is not the system. According to the note on 04/2019, [place] wants to repeat lab but unable to tell which lab need to be order. Please advise.

## 2019-07-28 NOTE — Telephone Encounter (Signed)
LVM for the pt to call back, need to ask question below and place order for hepatic panel for future (order is not future--lab cant see it).

## 2019-07-28 NOTE — Telephone Encounter (Signed)
Spoke with the pt, she is not taking Lamisil any more--cancel lab appt 07/29/2019. Pt schedule for CPE on 08/06/2019.

## 2019-07-28 NOTE — Telephone Encounter (Signed)
She needed to go to lab for repeat hepatic panel back in July. If she no no longer taking lamisil. There is not need for repeat lab results.

## 2019-07-29 ENCOUNTER — Other Ambulatory Visit: Payer: 59

## 2019-08-06 ENCOUNTER — Other Ambulatory Visit: Payer: Self-pay

## 2019-08-06 ENCOUNTER — Encounter: Payer: Self-pay | Admitting: Nurse Practitioner

## 2019-08-06 ENCOUNTER — Ambulatory Visit (INDEPENDENT_AMBULATORY_CARE_PROVIDER_SITE_OTHER): Payer: 59 | Admitting: Nurse Practitioner

## 2019-08-06 VITALS — BP 120/88 | HR 80 | Temp 97.5°F | Ht 66.0 in | Wt 227.6 lb

## 2019-08-06 DIAGNOSIS — I10 Essential (primary) hypertension: Secondary | ICD-10-CM

## 2019-08-06 DIAGNOSIS — Z0001 Encounter for general adult medical examination with abnormal findings: Secondary | ICD-10-CM

## 2019-08-06 DIAGNOSIS — E782 Mixed hyperlipidemia: Secondary | ICD-10-CM

## 2019-08-06 DIAGNOSIS — R0683 Snoring: Secondary | ICD-10-CM

## 2019-08-06 DIAGNOSIS — Z1211 Encounter for screening for malignant neoplasm of colon: Secondary | ICD-10-CM | POA: Diagnosis not present

## 2019-08-06 DIAGNOSIS — R7303 Prediabetes: Secondary | ICD-10-CM

## 2019-08-06 DIAGNOSIS — G4481 Hypnic headache: Secondary | ICD-10-CM

## 2019-08-06 DIAGNOSIS — F3341 Major depressive disorder, recurrent, in partial remission: Secondary | ICD-10-CM | POA: Diagnosis not present

## 2019-08-06 DIAGNOSIS — E559 Vitamin D deficiency, unspecified: Secondary | ICD-10-CM

## 2019-08-06 DIAGNOSIS — K635 Polyp of colon: Secondary | ICD-10-CM

## 2019-08-06 LAB — COMPREHENSIVE METABOLIC PANEL
ALT: 24 U/L (ref 0–35)
AST: 18 U/L (ref 0–37)
Albumin: 3.9 g/dL (ref 3.5–5.2)
Alkaline Phosphatase: 68 U/L (ref 39–117)
BUN: 21 mg/dL (ref 6–23)
CO2: 30 mEq/L (ref 19–32)
Calcium: 9.1 mg/dL (ref 8.4–10.5)
Chloride: 106 mEq/L (ref 96–112)
Creatinine, Ser: 0.74 mg/dL (ref 0.40–1.20)
GFR: 100.36 mL/min (ref >60.00)
Glucose, Bld: 103 mg/dL — ABNORMAL HIGH (ref 70–99)
Potassium: 4.2 mEq/L (ref 3.5–5.1)
Sodium: 140 mEq/L (ref 135–145)
Total Bilirubin: 0.5 mg/dL (ref 0.2–1.2)
Total Protein: 6.6 g/dL (ref 6.0–8.3)

## 2019-08-06 LAB — LIPID PANEL
Cholesterol: 164 mg/dL (ref 0–200)
HDL: 45.3 mg/dL (ref >39.00)
LDL Cholesterol: 103 mg/dL — ABNORMAL HIGH (ref 0–99)
NonHDL: 118.91
Total CHOL/HDL Ratio: 4
Triglycerides: 80 mg/dL (ref 0.0–149.0)
VLDL: 16 mg/dL (ref 0.0–40.0)

## 2019-08-06 LAB — HEMOGLOBIN A1C: Hgb A1c MFr Bld: 6.2 % (ref 4.6–6.5)

## 2019-08-06 LAB — TSH: TSH: 1 u[IU]/mL (ref 0.35–4.50)

## 2019-08-06 NOTE — Progress Notes (Signed)
Subjective:    Patient ID: Rebecca [name], female    DOB: January 22, 1971, 48 y.o.   MRN: [id number]  Patient presents today for complete physical and eval of chronic problems.  Headache  This is a new problem. The current episode started more than 1 month ago (80months). The problem occurs daily. The problem has been unchanged. The pain is located in the bilateral region. The pain does not radiate. The pain quality is not similar to prior headaches. The quality of the pain is described as aching. The pain is moderate. Pertinent negatives include no abdominal pain, anorexia, blurred vision, coughing, dizziness, drainage, fever, insomnia, loss of balance, muscle aches, nausea, neck pain, numbness, phonophobia, photophobia, rhinorrhea, scalp tenderness, sinus pressure, sore throat, tingling, tinnitus, visual change, vomiting, weakness or weight loss. Nothing aggravates the symptoms. She has tried acetaminophen and NSAIDs for the symptoms. The treatment provided moderate relief. Her past medical history is significant for hypertension and obesity. There is no history of recent head traumas or sinus disease.  reports she wakes up at about 3am everyday with headache, daytime somnolence, and fatigue despite 8hrs of sleep.Husband reports loud snoring. Sleep study done 2015: unremarkable. Daily tobacco use.  HTN: She discontinue metoprolol over 63months ago, does not check BP at home. BP Readings from Last 3 Encounters:  08/06/19 120/88  06/10/19 109/71  04/25/19 121/72   Sexual History (orientation,birth control, marital status, STD):married, sexually active, postmenopause, PAP and mammogram completed by GYN  Depression/Suicide: stable mood per patient even though response on PHQ/GAD questionnaire indicates other wise. Current use of cymbalta for several years. She is not interested in medication change of referral to therapist. Depression screen [facility] 2/9 08/06/2019 08/06/2019  Decreased Interest 3 0   Down, Depressed, Hopeless 0 0  PHQ - 2 Score 3 0  Altered sleeping 3 -  Tired, decreased energy 3 -  Change in appetite 1 -  Feeling bad or failure about yourself  0 -  Trouble concentrating 3 -  Moving slowly or fidgety/restless 0 -  Suicidal thoughts 0 -  PHQ-9 Score 13 -   GAD 7 : Generalized Anxiety Score 08/06/2019  Nervous, Anxious, on Edge 0  Control/stop worrying 3  Worry too much - different things 3  Trouble relaxing 3  Restless 0  Easily annoyed or irritable 3  Afraid - awful might happen 3  Total GAD 7 Score 15   Vision:up to date  Dental:up to date  Immunizations: (TDAP, Hep C screen, Pneumovax, Influenza, zoster)  Health Maintenance  Topic Date Due  . Colon Cancer Screening  12/24/2016  . Flu Shot  06/28/2019  . Pap Smear  11/01/2019  . Mammogram  05/20/2021  . Tetanus Vaccine  02/28/2023  . HIV Screening  Discontinued   Diet:regular  Weight:  Wt Readings from Last 3 Encounters:  08/06/19 227 lb 9.6 oz (103.2 kg)  05/26/19 228 lb (103.4 kg)  05/12/19 240 lb (108.9 kg)    Exercise:none  Fall Risk: Fall Risk  08/06/2019 05/26/2019  Falls in the past year? 0 0   Advanced Directive: Advanced Directives 01/12/2017  Does Patient Have a Medical Advance Directive? No  Would patient like information on creating a medical advance directive? Yes (MAU/Ambulatory/Procedural Areas - Information given)    Medications and allergies reviewed with patient and updated if appropriate.  Patient Active Problem List   Diagnosis Date Noted  . 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 Loud snoring 08/07/2019  . Chronic pain of left knee 07/03/2018  .  Onychomycosis 05/17/2018  . Tinea pedis of both feet 05/17/2018  . Prediabetes 11/16/2014  . Obesity 11/16/2014  . Essential hypertension 11/16/2014  . Mixed hyperlipidemia 10/06/2013  . Cholelithiasis 05/22/2012  . Benign colonic polyp 01/15/2012  . Constipation 12/12/2011  . Vitamin D deficiency 12/12/2011  . Depression,  major, recurrent, in partial remission ([place]) 12/12/2011  . GERD (gastroesophageal reflux disease) 01/24/2007    Current Outpatient Medications on File Prior to Visit  Medication Sig Dispense Refill  . miconazole (MICOTIN) 2 % powder Apply topically daily. 70 g 0  . Multiple Vitamin (MULTIVITAMIN WITH MINERALS) TABS tablet Take 1 tablet by mouth daily.    Marland Kitchen VITAMIN D PO Take 5,000 Units by mouth.     No current facility-administered medications on file prior to visit.     Past Medical History:  Diagnosis Date  . Anemia   . Colon polyp 12/25/2011   Tubular adenomatous  . Depression    following death of mother  . Duodenitis   . 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 Esophagitis   . Fatigue   . Gallstones   . GERD (gastroesophageal reflux disease)   . Hemorrhagic ovarian cyst 05/22/2012   04/2012   . Infection    UTI  . Neuromuscular disorder ([place])    hiatal hernia  . Obesity   . PUD (peptic ulcer disease)   . Sacroiliac pain   . Vaginal Pap smear, abnormal    colpo, results ok  . Vitamin D deficiency     Past Surgical History:  Procedure Laterality Date  . BREAST EXCISIONAL BIOPSY Left    benign  . COLPOSCOPY  2012   NEG  . DILATATION & CURETTAGE/HYSTEROSCOPY WITH MYOSURE N/A 01/17/2017   Procedure: [place];  Surgeon: Servando Salina, MD;  Location: [facility];  Service: Gynecology;  Laterality: N/A;  . TUBAL LIGATION      Social History   Socioeconomic History  . Marital status: Married    Spouse name: Not on file  . Number of children: 2  . Years of education: Not on file  . Highest education level: Not on file  Occupational History  . Occupation: NSNT    Employer: [employer]  . Financial resource strain: Not on file  . Food insecurity    Worry: Not on file    Inability: Not on file  . Transportation needs    Medical: Not on file    Non-medical: Not on file  Tobacco Use  . Smoking  status: Current Some Day Smoker    Packs/day: 0.10    Years: 5.00    Pack years: 0.50    Types: Cigarettes  . Smokeless tobacco: Never Used  Substance and Sexual Activity  . Alcohol use: Yes    Alcohol/week: 0.0 standard drinks    Comment: occasional glass of wine  . Drug use: No  . Sexual activity: Yes    Birth control/protection: Post-menopausal  Lifestyle  . Physical activity    Days per week: Not on file    Minutes per session: Not on file  . Stress: Not on file  Relationships  . Social Herbalist on phone: Not on file    Gets together: Not on file    Attends religious service: Not on file    Active member of club or organization: Not on file    Attends meetings of clubs or organizations: Not on file  Relationship status: Not on file  Other Topics Concern  . Not on file  Social History Narrative  . Not on file    Family History  Problem Relation Age of Onset  . Aneurysm Mother   . Heart disease Mother   . Hypertension Mother   . Stroke Mother   . Lupus Mother   . Diabetes Maternal Aunt   . Cancer Maternal Aunt   . Colon cancer Neg Hx         Review of Systems  Constitutional: Negative for fever, malaise/fatigue and weight loss.  HENT: Negative for congestion, rhinorrhea, sinus pressure, sore throat and tinnitus.   Eyes: Negative for blurred vision and photophobia.       Negative for visual changes  Respiratory: Negative for cough and shortness of breath.   Cardiovascular: Negative for chest pain, palpitations and leg swelling.  Gastrointestinal: Negative for abdominal pain, anorexia, blood in stool, constipation, diarrhea, heartburn, nausea and vomiting.  Genitourinary: Negative for dysuria, frequency and urgency.  Musculoskeletal: Negative for falls, joint pain, myalgias and neck pain.  Skin: Negative for rash.  Neurological: Positive for headaches. Negative for dizziness, tingling, sensory change, weakness, numbness and loss of balance.   Endo/Heme/Allergies: Does not bruise/bleed easily.  Psychiatric/Behavioral: Negative for depression, substance abuse and suicidal ideas. The patient is not nervous/anxious and does not have insomnia.    Objective:   Vitals:   08/06/19 0812  BP: 120/88  Pulse: 80  Temp: (!) 97.5 F (36.4 C)  SpO2: 98%   Body mass index is 36.74 kg/m.  Physical Examination:  Physical Exam Vitals signs reviewed.  Constitutional:      General: She is not in acute distress.    Appearance: She is well-developed.  HENT:     Head: Normocephalic.     Right Ear: Tympanic membrane, ear canal and external ear normal.     Left Ear: Tympanic membrane, ear canal and external ear normal.  Eyes:     Extraocular Movements: Extraocular movements intact.     Conjunctiva/sclera: Conjunctivae normal.     Pupils: Pupils are equal, round, and reactive to light.  Neck:     Musculoskeletal: Normal range of motion and neck supple.  Cardiovascular:     Rate and Rhythm: Normal rate and regular rhythm.     Pulses: Normal pulses.     Heart sounds: Normal heart sounds.  Pulmonary:     Effort: Pulmonary effort is normal. No respiratory distress.     Breath sounds: Normal breath sounds.  Chest:     Chest wall: No tenderness.  Abdominal:     General: Bowel sounds are normal.     Palpations: Abdomen is soft.  Genitourinary:    Comments: Deferred breast and pelvic exam to GYN Musculoskeletal: Normal range of motion.  Lymphadenopathy:     Cervical: No cervical adenopathy.  Skin:    General: Skin is warm and dry.  Neurological:     Mental Status: She is alert and oriented to person, place, and time.     Cranial Nerves: No cranial nerve deficit.     Gait: Gait normal.     Deep Tendon Reflexes: Reflexes are normal and symmetric.  Psychiatric:        Attention and Perception: Attention normal.        Mood and Affect: Mood normal. Affect is blunt and flat.        Speech: Speech normal.        Behavior: Behavior  normal. Behavior  is cooperative.        Thought Content: Thought content normal.        Cognition and Memory: Cognition normal.    ASSESSMENT and [name]:  Sharmayne was seen today for annual exam.  Diagnoses and all orders for this visit:  Encounter for preventative adult health care exam with abnormal findings  Essential hypertension -     TSH -     Comprehensive metabolic panel  Depression, major, recurrent, in partial remission (HCC) -     TSH -     DULoxetine (CYMBALTA) 60 MG capsule; Take 1 capsule (60 mg total) by mouth daily.  Mixed hyperlipidemia -     Comprehensive metabolic panel -     Lipid panel  Prediabetes -     Hemoglobin A1c  Vitamin D deficiency -     Vitamin D 1,25 dihydroxy  Benign colonic polyp -     Ambulatory referral to Gastroenterology  Colon cancer screening -     Ambulatory referral to Gastroenterology  Hypnic headache -     Ambulatory referral to Neurology -     CT Head Wo Contrast; Future  Loud snoring -     Ambulatory referral to Neurology   Hypnic headache Onset 52months ago, reports she wakes up at about 3am everyday with headache, location: top of head and frontal, associate with daytime somnolence, and fatigue despite 8hrs of sleep.Husband reports loud snoring. Sleep study done 2015: unremarkable. Daily tobacco use.  [name]: Repeat sleep study.  Get CT head Consider use of imitrex and/or melatonin if normal head CT.   see avs for detailed instructions    Problem List Items Addressed This Visit      Cardiovascular and Mediastinum   Essential hypertension   Relevant Orders   TSH (Completed)   Comprehensive metabolic panel (Completed)     Digestive   Benign colonic polyp   Relevant Orders   Ambulatory referral to Gastroenterology     Other   Depression, major, recurrent, in partial remission ([place])   Relevant Medications   DULoxetine (CYMBALTA) 60 MG capsule   Other Relevant Orders   TSH (Completed)   Hypnic headache     Onset 73months ago, reports she wakes up at about 3am everyday with headache, location: top of head and frontal, associate with daytime somnolence, and fatigue despite 8hrs of sleep.Husband reports loud snoring. Sleep study done 2015: unremarkable. Daily tobacco use.  [name]: Repeat sleep study.  Get CT head Consider use of imitrex and/or melatonin if normal head CT.       Relevant Medications   DULoxetine (CYMBALTA) 60 MG capsule   Other Relevant Orders   Ambulatory referral to Neurology   CT Head Wo Contrast   Loud snoring   Relevant Orders   Ambulatory referral to Neurology   Mixed hyperlipidemia   Relevant Orders   Comprehensive metabolic panel (Completed)   Lipid panel (Completed)   Prediabetes   Relevant Orders   Hemoglobin A1c (Completed)   Vitamin D deficiency   Relevant Orders   Vitamin D 1,25 dihydroxy    Other Visit Diagnoses    Encounter for preventative adult health care exam with abnormal findings    -  Primary   Colon cancer screening       Relevant Orders   Ambulatory referral to Gastroenterology      Follow up: Return in about 6 months (around 02/03/2020) for HTN and, hyperlipidemia.  Wilfred Lacy, NP

## 2019-08-06 NOTE — Patient Instructions (Addendum)
Schedule nurse visit for influenza vaccine at your convenience.  You will be contacted to schedule appt with GI: need repeat colonoscopy. You will be contacted to schedule appt with neurology for repeat sleep study. You will be contacted to schedule appt for head CT  We will obtain last PAP and mammogram report from GYN.  Normal TSH, renal and liver function Mild elevation in glucose with hgbA1c of 6.2. this indicates prediabetes. Lipid panel indicates mild elevation in LDL. Pending vitamin D. It is imperative to follow DASH diet, decrease food portions, and start daily exercise (moderate intensity like walking 30-33mins a day). F/up in 43months  I encourage DASH diet and regular exercise (walking 30-65mins daily) to promote weight loss. This will help with snoring and improve sleep quality. Monitor BP at home 2-3x/week. Call office if BP >140/80.   Health Maintenance, Female Adopting a healthy lifestyle and getting preventive care are important in promoting health and wellness. Ask your health care provider about:  The right schedule for you to have regular tests and exams.  Things you can do on your own to prevent diseases and keep yourself healthy. What should I know about diet, weight, and exercise? Eat a healthy diet   Eat a diet that includes plenty of vegetables, fruits, low-fat dairy products, and lean protein.  Do not eat a lot of foods that are high in solid fats, added sugars, or sodium. Maintain a healthy weight Body mass index (BMI) is used to identify weight problems. It estimates body fat based on height and weight. Your health care provider can help determine your BMI and help you achieve or maintain a healthy weight. Get regular exercise Get regular exercise. This is one of the most important things you can do for your health. Most adults should:  Exercise for at least 150 minutes each week. The exercise should increase your heart rate and make you sweat  (moderate-intensity exercise).  Do strengthening exercises at least twice a week. This is in addition to the moderate-intensity exercise.  Spend less time sitting. Even light physical activity can be beneficial. Watch cholesterol and blood lipids Have your blood tested for lipids and cholesterol at 51 years of age, then have this test every 5 years. Have your cholesterol levels checked more often if:  Your lipid or cholesterol levels are high.  You are older than 50 years of age.  You are at high risk for heart disease. What should I know about cancer screening? Depending on your health history and family history, you may need to have cancer screening at various ages. This may include screening for:  Breast cancer.  Cervical cancer.  Colorectal cancer.  Skin cancer.  Lung cancer. What should I know about heart disease, diabetes, and high blood pressure? Blood pressure and heart disease  High blood pressure causes heart disease and increases the risk of stroke. This is more likely to develop in people who have high blood pressure readings, are of African descent, or are overweight.  Have your blood pressure checked: ? Every 3-5 years if you are 11-92 years of age. ? Every year if you are 36 years old or older. Diabetes Have regular diabetes screenings. This checks your fasting blood sugar level. Have the screening done:  Once every three years after age 43 if you are at a normal weight and have a low risk for diabetes.  More often and at a younger age if you are overweight or have a high risk for diabetes. What  should I know about preventing infection? Hepatitis B If you have a higher risk for hepatitis B, you should be screened for this virus. Talk with your health care provider to find out if you are at risk for hepatitis B infection. Hepatitis C Testing is recommended for:  Everyone born from [birth year] through 1967.  Anyone with known risk factors for hepatitis  C. Sexually transmitted infections (STIs)  Get screened for STIs, including gonorrhea and chlamydia, if: ? You are sexually active and are younger than 50 years of age. ? You are older than 50 years of age and your health care provider tells you that you are at risk for this type of infection. ? Your sexual activity has changed since you were last screened, and you are at increased risk for chlamydia or gonorrhea. Ask your health care provider if you are at risk.  Ask your health care provider about whether you are at high risk for HIV. Your health care provider may recommend a prescription medicine to help prevent HIV infection. If you choose to take medicine to prevent HIV, you should first get tested for HIV. You should then be tested every 3 months for as long as you are taking the medicine. Pregnancy  If you are about to stop having your period (premenopausal) and you may become pregnant, seek counseling before you get pregnant.  Take 400 to 800 micrograms (mcg) of folic acid every day if you become pregnant.  Ask for birth control (contraception) if you want to prevent pregnancy. Osteoporosis and menopause Osteoporosis is a disease in which the bones lose minerals and strength with aging. This can result in bone fractures. If you are 85 years old or older, or if you are at risk for osteoporosis and fractures, ask your health care provider if you should:  Be screened for bone loss.  Take a calcium or vitamin D supplement to lower your risk of fractures.  Be given hormone replacement therapy (HRT) to treat symptoms of menopause. Follow these instructions at home: Lifestyle  Do not use any products that contain nicotine or tobacco, such as cigarettes, e-cigarettes, and chewing tobacco. If you need help quitting, ask your health care provider.  Do not use street drugs.  Do not share needles.  Ask your health care provider for help if you need support or information about quitting  drugs. Alcohol use  Do not drink alcohol if: ? Your health care provider tells you not to drink. ? You are pregnant, may be pregnant, or are planning to become pregnant.  If you drink alcohol: ? Limit how much you use to 0-1 drink a day. ? Limit intake if you are breastfeeding.  Be aware of how much alcohol is in your drink. In the [place] one drink equals one 12 oz bottle of beer (355 mL), one 5 oz glass of wine (148 mL), or one 1 oz glass of hard liquor (44 mL). General instructions  Schedule regular health, dental, and eye exams.  Stay current with your vaccines.  Tell your health care provider if: ? You often feel depressed. ? You have ever been abused or do not feel safe at home. Summary  Adopting a healthy lifestyle and getting preventive care are important in promoting health and wellness.  Follow your health care provider's instructions about healthy diet, exercising, and getting tested or screened for diseases.  Follow your health care provider's instructions on monitoring your cholesterol and blood pressure. This information is not intended to replace  advice given to you by your health care provider. Make sure you discuss any questions you have with your health care provider. Document Released: 05/29/2011 Document Revised: 11/06/2018 Document Reviewed: 11/06/2018 Elsevier Patient Education  [place] Headache Without Cause A headache is pain or discomfort that is felt around the head or neck area. There are many causes and types of headaches. In some cases, the cause may not be found. Follow these instructions at home: Watch your condition for any changes. Let your doctor know about them. Take these steps to help with your condition: Managing pain      Take over-the-counter and prescription medicines only as told by your doctor.  Lie down in a dark, quiet room when you have a headache.  If told, put ice on your head and neck area: ? Put ice  in a plastic bag. ? Place a towel between your skin and the bag. ? Leave the ice on for 20 minutes, 2-3 times per day.  If told, put heat on the affected area. Use the heat source that your doctor recommends, such as a moist heat pack or a heating pad. ? Place a towel between your skin and the heat source. ? Leave the heat on for 20-30 minutes. ? Remove the heat if your skin turns bright red. This is very important if you are unable to feel pain, heat, or cold. You may have a greater risk of getting burned.  Keep lights dim if bright lights bother you or make your headaches worse. Eating and drinking  Eat meals on a regular schedule.  If you drink alcohol: ? Limit how much you use to:  0-1 drink a day for women.  0-2 drinks a day for men. ? Be aware of how much alcohol is in your drink. In the [place] one drink equals one 12 oz bottle of beer (355 mL), one 5 oz glass of wine (148 mL), or one 1 oz glass of hard liquor (44 mL).  Stop drinking caffeine, or reduce how much caffeine you drink. General instructions   Keep a journal to find out if certain things bring on headaches. For example, write down: ? What you eat and drink. ? How much sleep you get. ? Any change to your diet or medicines.  Get a massage or try other ways to relax.  Limit stress.  Sit up straight. Do not tighten (tense) your muscles.  Do not use any products that contain nicotine or tobacco. This includes cigarettes, e-cigarettes, and chewing tobacco. If you need help quitting, ask your doctor.  Exercise regularly as told by your doctor.  Get enough sleep. This often means 7-9 hours of sleep each night.  Keep all follow-up visits as told by your doctor. This is important. Contact a doctor if:  Your symptoms are not helped by medicine.  You have a headache that feels different than the other headaches.  You feel sick to your stomach (nauseous) or you throw up (vomit).  You have a fever. Get help  right away if:  Your headache gets very bad quickly.  Your headache gets worse after a lot of physical activity.  You keep throwing up.  You have a stiff neck.  You have trouble seeing.  You have trouble speaking.  You have pain in the eye or ear.  Your muscles are weak or you lose muscle control.  You lose your balance or have trouble walking.  You feel like you will  pass out (faint) or you pass out.  You are mixed up (confused).  You have a seizure. Summary  A headache is pain or discomfort that is felt around the head or neck area.  There are many causes and types of headaches. In some cases, the cause may not be found.  Keep a journal to help find out what causes your headaches. Watch your condition for any changes. Let your doctor know about them.  Contact a doctor if you have a headache that is different from usual, or if your headache is not helped by medicine.  Get help right away if your headache gets very bad, you throw up, you have trouble seeing, you lose your balance, or you have a seizure. This information is not intended to replace advice given to you by your health care provider. Make sure you discuss any questions you have with your health care provider. Document Released: 08/22/2008 Document Revised: 06/03/2018 Document Reviewed: 06/03/2018 Elsevier Patient Education  2020 [employer].

## 2019-08-07 DIAGNOSIS — R0683 Snoring: Secondary | ICD-10-CM | POA: Insufficient documentation

## 2019-08-07 DIAGNOSIS — G4481 Hypnic headache: Secondary | ICD-10-CM | POA: Insufficient documentation

## 2019-08-07 DIAGNOSIS — G4733 Obstructive sleep apnea (adult) (pediatric): Secondary | ICD-10-CM | POA: Insufficient documentation

## 2019-08-07 MED ORDER — DULOXETINE HCL 60 MG PO CPEP: 60.0000 mg | ORAL_CAPSULE | Freq: Every day | ORAL | 3 refills | Status: DC

## 2019-08-07 NOTE — Assessment & Plan Note (Signed)
>>  ASSESSMENT AND PLAN FOR HYPNIC HEADACHE WRITTEN ON 08/07/2019 12:23 PM BY Azrielle Springsteen LUM, NP  Onset 2months ago, reports she wakes up at about 3am everyday with headache, location: top of head and frontal, associate with daytime somnolence, and fatigue despite 8hrs of sleep.Husband reports loud snoring. Sleep study done 2015: unremarkable. Daily tobacco use.  Plan: Repeat sleep study.  Get CT head Consider use of imitrex  and/or melatonin if normal head CT.

## 2019-08-07 NOTE — Assessment & Plan Note (Signed)
Onset 43months ago, reports she wakes up at about 3am everyday with headache, location: top of head and frontal, associate with daytime somnolence, and fatigue despite 8hrs of sleep.Husband reports loud snoring. Sleep study done 2015: unremarkable. Daily tobacco use.  Plan: Repeat sleep study.  Get CT head Consider use of imitrex and/or melatonin if normal head CT.

## 2019-08-09 ENCOUNTER — Telehealth: Payer: 59 | Admitting: Family

## 2019-08-09 DIAGNOSIS — R300 Dysuria: Secondary | ICD-10-CM | POA: Diagnosis not present

## 2019-08-09 LAB — VITAMIN D 1,25 DIHYDROXY
Vitamin D 1, 25 (OH)2 Total: 53 pg/mL (ref 18–72)
Vitamin D2 1, 25 (OH)2: <8 pg/mL
Vitamin D3 1, 25 (OH)2: 53 pg/mL

## 2019-08-09 MED ORDER — NITROFURANTOIN MONOHYD MACRO 100 MG PO CAPS: 100.0000 mg | ORAL_CAPSULE | Freq: Two times a day (BID) | ORAL | 0 refills | Status: DC

## 2019-08-09 NOTE — Progress Notes (Signed)

## 2019-08-17 ENCOUNTER — Telehealth: Payer: 59 | Admitting: Physician Assistant

## 2019-08-17 DIAGNOSIS — N898 Other specified noninflammatory disorders of vagina: Secondary | ICD-10-CM

## 2019-08-17 NOTE — Progress Notes (Signed)
Based on what you shared with me, I feel your condition warrants further evaluation and I recommend that you be seen for a face to face office visit.  NOTE: If you entered your credit card information for this eVisit, you will not be charged. You may see a "hold" on your card for the $35 but that hold will drop off and you will not have a charge processed.  If you are having a true medical emergency please call 911.     For an urgent face to face visit, [facility] has four urgent care centers for your convenience:   . [facility]    [phone]                  Get Driving Directions  [phone] [place], [place] [address] . 10 am to 8 pm Monday-Friday . 12 pm to 8 pm Saturday-Sunday   . [facility] [place]                  Get Driving Directions  [phone] [place], [place], [place] [address] . 8 am to 8 pm Monday-Friday . 9 am to 6 pm Saturday . 11 am to 6 pm Sunday   . [facility] [place]                  Get Driving Directions   [address] [place], [place] [address] . 8 am to 8 pm Monday-Friday . 8 am to 4 pm Saturday-Sunday    . [facility] [place]                    Get Driving Directions  [id number]  [address] [place], [place] [address]  . Monday-Friday, 12 PM to 6 PM    Your e-visit answers were reviewed by a board certified advanced clinical practitioner to complete your personal care plan.  Thank you for using e-Visits.  Greater than 5 minutes, yet less than 10 minutes of time have been spent researching, coordinating, and implementing care for this patient today

## 2019-08-19 ENCOUNTER — Encounter (HOSPITAL_COMMUNITY): Payer: Self-pay

## 2019-08-19 ENCOUNTER — Ambulatory Visit (HOSPITAL_COMMUNITY)
Admission: EM | Admit: 2019-08-19 | Discharge: 2019-08-19 | Disposition: A | Discharge status: Home / Self Care | Payer: 59 | Attending: Urgent Care | Admitting: Urgent Care

## 2019-08-19 DIAGNOSIS — M542 Cervicalgia: Secondary | ICD-10-CM | POA: Diagnosis present

## 2019-08-19 DIAGNOSIS — M2560 Stiffness of unspecified joint, not elsewhere classified: Secondary | ICD-10-CM | POA: Insufficient documentation

## 2019-08-19 DIAGNOSIS — M545 Low back pain, unspecified: Secondary | ICD-10-CM

## 2019-08-19 DIAGNOSIS — M47814 Spondylosis without myelopathy or radiculopathy, thoracic region: Secondary | ICD-10-CM | POA: Diagnosis not present

## 2019-08-19 DIAGNOSIS — M2569 Stiffness of other specified joint, not elsewhere classified: Secondary | ICD-10-CM

## 2019-08-19 LAB — POCT URINALYSIS DIP (DEVICE)
Bilirubin Urine: NEGATIVE
Glucose, UA: NEGATIVE mg/dL
Hgb urine dipstick: NEGATIVE
Ketones, ur: NEGATIVE mg/dL
Leukocytes,Ua: NEGATIVE
Nitrite: NEGATIVE
Protein, ur: NEGATIVE mg/dL
Specific Gravity, Urine: 1.02 (ref 1.005–1.030)
Urobilinogen, UA: 1 mg/dL (ref 0.0–1.0)
pH: 7.5 (ref 5.0–8.0)

## 2019-08-19 MED ORDER — ONDANSETRON 8 MG PO TBDP: 8.0000 mg | ORAL_TABLET | Freq: Three times a day (TID) | ORAL | 0 refills | Status: DC | PRN

## 2019-08-19 MED ORDER — METHYLPREDNISOLONE ACETATE 80 MG/ML IJ SUSP
INTRAMUSCULAR | Status: AC
  Filled 2019-08-19: qty 1

## 2019-08-19 MED ORDER — METHYLPREDNISOLONE ACETATE 80 MG/ML IJ SUSP
80.0000 mg | Freq: Once | INTRAMUSCULAR | Status: AC
  Administered 2019-08-19: 80 mg via INTRAMUSCULAR

## 2019-08-19 MED ORDER — MELOXICAM 7.5 MG PO TABS: 7.5000 mg | ORAL_TABLET | Freq: Every day | ORAL | 0 refills | Status: DC

## 2019-08-19 MED ORDER — CYCLOBENZAPRINE HCL 5 MG PO TABS: 5.0000 mg | ORAL_TABLET | Freq: Three times a day (TID) | ORAL | 0 refills | Status: DC | PRN

## 2019-08-19 NOTE — ED Triage Notes (Signed)
Patient reports having dysuria, back pain and legs pain for 2 weeks. She also report she is having blood in her urine. She started taking metronidazole 500 mg she had some at home and she is also taking nitrofurantoin 100 mg prescribed on e-visit with [facility], she states this medications are not helping her

## 2019-08-19 NOTE — ED Provider Notes (Addendum)
MRN: [id number] DOB: [birth year]  Subjective:   Rebecca [name] is a 48 y.o. female presenting for 2-week history of progressively worsening now severe constant sharp aching pain along her entire back.  Patient had labs through her PCP on 08/06/2019, all were unremarkable, negative for diabetes.  States that her pain was not as severe then.  In May and July 2020, she has also had CT of her chest, coronary CT, chest x-ray which were all negative with only mild midthoracic spondylosis.  Patient did have an ED visit about a week ago, was prescribed Macrobid and Flagyl for her symptoms to cover for urinary tract infection given patient report of hematuria.  Her back pain has progressed since then.  PMH mentions neuromuscular disorder but I am unable to find this in her chart.  She takes Cymbalta for depression.  Denies any history of back trauma, car accidents, falls, surgeries of her back.  Denies weakness, incontinence, inability to urinate or defecate, dysuria, urinary frequency, radicular symptoms.  Patient does not have to do a lot of heavy lifting, walking.  Her primary job involves long periods of sedentary work.   No current facility-administered medications for this encounter.   Current Outpatient Medications:  .  DULoxetine (CYMBALTA) 60 MG capsule, Take 1 capsule (60 mg total) by mouth daily., Disp: 90 capsule, Rfl: 3 .  metroNIDAZOLE (FLAGYL) 500 MG tablet, Take 500 mg by mouth 2 (two) times daily., Disp: , Rfl:  .  miconazole (MICOTIN) 2 % powder, Apply topically daily., Disp: 70 g, Rfl: 0 .  Multiple Vitamin (MULTIVITAMIN WITH MINERALS) TABS tablet, Take 1 tablet by mouth daily., Disp: , Rfl:  .  nitrofurantoin, macrocrystal-monohydrate, (MACROBID) 100 MG capsule, Take 1 capsule (100 mg total) by mouth 2 (two) times daily., Disp: 10 capsule, Rfl: 0 .  VITAMIN D PO, Take 5,000 Units by mouth., Disp: , Rfl:     Allergies  Allergen Reactions  . Penicillins Hives    Past Medical History:   Diagnosis Date  . Anemia   . Colon polyp 12/25/2011   Tubular adenomatous  . Depression    following death of mother  . Duodenitis   . 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 Esophagitis   . Fatigue   . Gallstones   . GERD (gastroesophageal reflux disease)   . Hemorrhagic ovarian cyst 05/22/2012   04/2012   . Infection    UTI  . Neuromuscular disorder ([place])    hiatal hernia  . Obesity   . PUD (peptic ulcer disease)   . Sacroiliac pain   . Vaginal Pap smear, abnormal    colpo, results ok  . Vitamin D deficiency      Past Surgical History:  Procedure Laterality Date  . BREAST EXCISIONAL BIOPSY Left    benign  . COLPOSCOPY  2012   NEG  . DILATATION & CURETTAGE/HYSTEROSCOPY WITH MYOSURE N/A 01/17/2017   Procedure: [place];  Surgeon: Servando Salina, MD;  Location: [facility];  Service: Gynecology;  Laterality: N/A;  . TUBAL LIGATION      ROS   Objective:   Vitals: BP (!) 152/93 (BP Location: Right Arm)   Pulse 89   Temp 98.8 F (37.1 C) (Temporal)   Resp 16   LMP 08/13/2013 Comment: negative urine pregnancy test 12-2013  SpO2 99%   Physical Exam Constitutional:      General: She is in acute distress (from her pain).     Appearance: Normal appearance.  She is well-developed. She is obese. She is not ill-appearing, toxic-appearing or diaphoretic.  HENT:     Head: Normocephalic and atraumatic.     Nose: Nose normal.     Mouth/Throat:     Mouth: Mucous membranes are moist.     Pharynx: Oropharynx is clear.  Eyes:     General: No scleral icterus.    Extraocular Movements: Extraocular movements intact.     Pupils: Pupils are equal, round, and reactive to light.  Cardiovascular:     Rate and Rhythm: Normal rate.  Pulmonary:     Effort: Pulmonary effort is normal.  Musculoskeletal:     Comments: Patient had tenderness along entire back, mostly over paraspinal muscles, not elicited on exam but by report was worst over  her thoracic region.  Range of motion for her spine is mostly intact with some limits in full extension and flexion of the lumbar region and cervical region.  Strength is 5/5 throughout.  Negative straight leg raise, negative Lhermitte's sign.  Skin:    General: Skin is warm and dry.  Neurological:     General: No focal deficit present.     Mental Status: She is alert and oriented to person, place, and time.     Coordination: Coordination abnormal (Difficulty sitting up straight, leaning backwards on exam table).     Deep Tendon Reflexes: Reflexes normal.  Psychiatric:        Mood and Affect: Affect is tearful.        Behavior: Behavior normal.     Results for orders placed or performed during the hospital encounter of 08/19/19 (from the past 24 hour(s))  POCT urinalysis dip (device)     Status: None   Collection Time: 08/19/19 10:22 AM  Result Value Ref Range   Glucose, UA NEGATIVE NEGATIVE mg/dL   Bilirubin Urine NEGATIVE NEGATIVE   Ketones, ur NEGATIVE NEGATIVE mg/dL   Specific Gravity, Urine 1.020 1.005 - 1.030   Hgb urine dipstick NEGATIVE NEGATIVE   pH 7.5 5.0 - 8.0   Protein, ur NEGATIVE NEGATIVE mg/dL   Urobilinogen, UA 1.0 0.0 - 1.0 mg/dL   Nitrite NEGATIVE NEGATIVE   Leukocytes,Ua NEGATIVE NEGATIVE    Assessment and Plan :   1. Acute bilateral low back pain without sciatica   2. Thoracic spondylosis without myelopathy   3. Cervical pain (neck)   4. Back stiffness     Reviewed all of her recent labs, imaging with patient.  We will try to address her severe back pain for musculoskeletal source with IM Depo-Medrol, meloxicam at home and Flexeril.  Currently she is not unstable, not a good candidate for emergency evaluation.  However, emphasized need to seek consultation with spine specialist, information provided to the patient. Counseled patient on potential for adverse effects with medications prescribed/recommended today, strict ER and return-to-clinic precautions  discussed, patient verbalized understanding.    Jaynee Eagles, PA-C 08/19/19 1110

## 2019-08-20 ENCOUNTER — Encounter: Payer: Self-pay | Admitting: Nurse Practitioner

## 2019-08-20 LAB — URINE CULTURE

## 2019-08-20 NOTE — Progress Notes (Signed)
Abstracted result and sent to scan  

## 2019-08-21 ENCOUNTER — Ambulatory Visit
Admission: RE | Admit: 2019-08-21 | Discharge: 2019-08-21 | Disposition: A | Discharge status: Home / Self Care | Payer: 59 | Source: Ambulatory Visit | Attending: Nurse Practitioner | Admitting: Nurse Practitioner

## 2019-08-21 DIAGNOSIS — G4481 Hypnic headache: Secondary | ICD-10-CM

## 2019-10-06 ENCOUNTER — Encounter: Payer: Self-pay | Admitting: Nurse Practitioner

## 2019-12-09 ENCOUNTER — Encounter: Payer: Self-pay | Admitting: Internal Medicine

## 2019-12-11 ENCOUNTER — Other Ambulatory Visit (HOSPITAL_COMMUNITY): Payer: Self-pay

## 2020-01-01 ENCOUNTER — Other Ambulatory Visit: Payer: Self-pay

## 2020-01-01 ENCOUNTER — Ambulatory Visit: Payer: No Typology Code available for payment source | Admitting: *Deleted

## 2020-01-01 VITALS — Temp 96.6°F | Ht 66.0 in | Wt 238.6 lb

## 2020-01-01 DIAGNOSIS — Z86010 Personal history of colonic polyps: Secondary | ICD-10-CM

## 2020-01-01 DIAGNOSIS — Z01818 Encounter for other preprocedural examination: Secondary | ICD-10-CM

## 2020-01-01 MED ORDER — SUPREP BOWEL PREP KIT 17.5-3.13-1.6 GM/177ML PO SOLN: 1.0000 | Freq: Once | ORAL | 0 refills | Status: AC

## 2020-01-01 MED FILL — SUPREP BOWEL PREP KIT: 17.5-3.13-1 | 1 days supply | Qty: 354 | Fill #0

## 2020-01-01 NOTE — Progress Notes (Signed)

## 2020-01-12 ENCOUNTER — Ambulatory Visit (INDEPENDENT_AMBULATORY_CARE_PROVIDER_SITE_OTHER): Payer: 59

## 2020-01-12 ENCOUNTER — Other Ambulatory Visit: Payer: Self-pay | Admitting: Internal Medicine

## 2020-01-12 DIAGNOSIS — Z1159 Encounter for screening for other viral diseases: Secondary | ICD-10-CM

## 2020-01-13 LAB — SARS CORONAVIRUS 2 (TAT 6-24 HRS): SARS Coronavirus 2: NEGATIVE

## 2020-01-15 ENCOUNTER — Encounter: Payer: 59 | Admitting: Internal Medicine

## 2020-03-02 ENCOUNTER — Encounter: Payer: 59 | Admitting: Internal Medicine

## 2020-04-14 ENCOUNTER — Emergency Department (HOSPITAL_COMMUNITY): Payer: Self-pay

## 2020-04-14 ENCOUNTER — Emergency Department (HOSPITAL_COMMUNITY)
Admission: EM | Admit: 2020-04-14 | Discharge: 2020-04-14 | Disposition: A | Discharge status: Home / Self Care | Payer: Self-pay | Attending: Emergency Medicine | Admitting: Emergency Medicine

## 2020-04-14 ENCOUNTER — Encounter (HOSPITAL_COMMUNITY): Payer: Self-pay | Admitting: Emergency Medicine

## 2020-04-14 ENCOUNTER — Emergency Department (HOSPITAL_BASED_OUTPATIENT_CLINIC_OR_DEPARTMENT_OTHER): Payer: Self-pay

## 2020-04-14 ENCOUNTER — Other Ambulatory Visit: Payer: Self-pay

## 2020-04-14 DIAGNOSIS — R0981 Nasal congestion: Secondary | ICD-10-CM | POA: Insufficient documentation

## 2020-04-14 DIAGNOSIS — M7989 Other specified soft tissue disorders: Secondary | ICD-10-CM

## 2020-04-14 DIAGNOSIS — M79604 Pain in right leg: Secondary | ICD-10-CM | POA: Insufficient documentation

## 2020-04-14 DIAGNOSIS — Z79899 Other long term (current) drug therapy: Secondary | ICD-10-CM | POA: Insufficient documentation

## 2020-04-14 DIAGNOSIS — R519 Headache, unspecified: Secondary | ICD-10-CM | POA: Insufficient documentation

## 2020-04-14 DIAGNOSIS — R52 Pain, unspecified: Secondary | ICD-10-CM

## 2020-04-14 DIAGNOSIS — I10 Essential (primary) hypertension: Secondary | ICD-10-CM | POA: Insufficient documentation

## 2020-04-14 DIAGNOSIS — Z20822 Contact with and (suspected) exposure to covid-19: Secondary | ICD-10-CM | POA: Insufficient documentation

## 2020-04-14 LAB — BASIC METABOLIC PANEL
Anion gap: 7 (ref 5–15)
BUN: 15 mg/dL (ref 6–20)
CO2: 28 mmol/L (ref 22–32)
Calcium: 9.1 mg/dL (ref 8.9–10.3)
Chloride: 106 mmol/L (ref 98–111)
Creatinine, Ser: 0.73 mg/dL (ref 0.44–1.00)
GFR calc Af Amer: >60 mL/min (ref >60)
GFR calc non Af Amer: >60 mL/min (ref >60)
Glucose, Bld: 112 mg/dL — ABNORMAL HIGH (ref 70–99)
Potassium: 4.5 mmol/L (ref 3.5–5.1)
Sodium: 141 mmol/L (ref 135–145)

## 2020-04-14 LAB — CBC WITH DIFFERENTIAL/PLATELET
Abs Immature Granulocytes: 0.01 10*3/uL (ref 0.00–0.07)
Basophils Absolute: 0 10*3/uL (ref 0.0–0.1)
Basophils Relative: 1 %
Eosinophils Absolute: 0.1 10*3/uL (ref 0.0–0.5)
Eosinophils Relative: 3 %
HCT: 41.4 % (ref 36.0–46.0)
Hemoglobin: 13.2 g/dL (ref 12.0–15.0)
Immature Granulocytes: 0 %
Lymphocytes Relative: 52 %
Lymphs Abs: 1.8 10*3/uL (ref 0.7–4.0)
MCH: 29.2 pg (ref 26.0–34.0)
MCHC: 31.9 g/dL (ref 30.0–36.0)
MCV: 91.6 fL (ref 80.0–100.0)
Monocytes Absolute: 0.3 10*3/uL (ref 0.1–1.0)
Monocytes Relative: 9 %
Neutro Abs: 1.3 10*3/uL — ABNORMAL LOW (ref 1.7–7.7)
Neutrophils Relative %: 35 %
Platelets: 260 10*3/uL (ref 150–400)
RBC: 4.52 MIL/uL (ref 3.87–5.11)
RDW: 12.7 % (ref 11.5–15.5)
WBC: 3.5 10*3/uL — ABNORMAL LOW (ref 4.0–10.5)
nRBC: 0 % (ref 0.0–0.2)

## 2020-04-14 LAB — TROPONIN I (HIGH SENSITIVITY): Troponin I (High Sensitivity): <2 ng/L (ref <18)

## 2020-04-14 LAB — SARS CORONAVIRUS 2 BY RT PCR (HOSPITAL ORDER, PERFORMED IN ~~LOC~~ HOSPITAL LAB): SARS Coronavirus 2: NEGATIVE

## 2020-04-14 MED ORDER — DEXAMETHASONE SODIUM PHOSPHATE 10 MG/ML IJ SOLN
10.0000 mg | Freq: Once | INTRAMUSCULAR | Status: AC
  Administered 2020-04-14: 10 mg via INTRAVENOUS
  Filled 2020-04-14: qty 1

## 2020-04-14 MED ORDER — METHOCARBAMOL 500 MG PO TABS
500.0000 mg | ORAL_TABLET | Freq: Two times a day (BID) | ORAL | 0 refills | Status: DC
Start: 2020-04-14 — End: 2020-10-19

## 2020-04-14 MED ORDER — SODIUM CHLORIDE 0.9 % IV BOLUS
1000.0000 mL | Freq: Once | INTRAVENOUS | Status: AC
  Administered 2020-04-14: 1000 mL via INTRAVENOUS

## 2020-04-14 MED ORDER — METOCLOPRAMIDE HCL 5 MG/ML IJ SOLN
10.0000 mg | Freq: Once | INTRAMUSCULAR | Status: AC
  Administered 2020-04-14: 10 mg via INTRAVENOUS
  Filled 2020-04-14: qty 2

## 2020-04-14 NOTE — ED Notes (Signed)
Pt ambulatory to bathroom without assistance.

## 2020-04-14 NOTE — ED Triage Notes (Signed)
Pt c/o bilat leg pains since Friday. Denies fall or injuries. Also c/o posterior headache for a couple days. Reports that this morning her heart felt like racing so she took an old xanax she had. Pt tearful and crying sue to her mother dying her 59 years ago from an aneurysm.

## 2020-04-14 NOTE — Discharge Instructions (Addendum)
Headache Your white blood cell count was a little lower than normal, but this can have a variety of reasons.  It should be rechecked by your primary care provider.  Your lab results otherwise showed no acute abnormalities.  For future headaches please try the following regimen: Antiinflammatory medications: Take 600 mg of ibuprofen every 6 hours or 440 mg (over the counter dose) to 500 mg (prescription dose) of naproxen every 12 hours.  Use this regimen until headache subsides for up to 3 days .  After this time, these medications may be used as needed for pain. Take these medications with food to avoid upset stomach. Choose only one of these medications, do not take them together. Acetaminophen: Should you continue to have additional pain while taking the ibuprofen or naproxen, you may add in acetaminophen (generic for Tylenol) as needed. Your daily total maximum amount of acetaminophen from all sources should be limited to 4000mg /day for persons without liver problems, or 2000mg /day for those with liver problems.  Hydration: Have a goal of about a half liter of water every couple hours to stay well hydrated.   Sleep: Please be sure to get plenty of sleep with a goal of 8 hours per night. Having a regular bed time and bedtime routine can help with this.  Screens: Reduce the amount of time you are in front of screens.  Take about a 5-10-minute break every hour or every couple hours to give your eyes rest.  Do not use screens in dark rooms.  Glasses with a blue light filter may also help reduce eye fatigue.  Stress: Take steps to reduce stress as much as possible.  Methocarbamol: Methocarbamol (generic for Robaxin) is a muscle relaxer and can help relieve stiff muscles or muscle spasms.  Do not drive or perform other dangerous activities while taking this medication as it can cause drowsiness as well as changes in reaction time and judgement.  Follow up: Follow-up with your primary care provider on  this issue.  May also need to follow-up with the neurologist for increased frequency of headaches.  Test Results for COVID-19 pending  You have a test pending for COVID-19.  Results typically return within about 48 hours.  Be sure to check MyChart for updated results.  We recommend isolating yourself until results are received.  Patients who have symptoms consistent with COVID-19 should self isolated for: At least 3 days (72 hours) have passed since recovery, defined as resolution of fever without the use of fever reducing medications and improvement in respiratory symptoms (e.g., cough, shortness of breath), and At least 7 days have passed since symptoms first appeared.  If you have no symptoms, but your test returns positive, recommend isolating for at least 10 days.    Should the chest discomfort continue to recur, follow-up with cardiology on the matter.

## 2020-04-14 NOTE — ED Provider Notes (Signed)
[facility] DEPT Provider Note   CSN: [id number] Arrival date & time: 04/14/20  1102     History Chief Complaint  Patient presents with  . Leg Pain  . Headache    SILAS MICHIELS is a 49 y.o. female.  HPI      SHELLYE KLOEPFER is a 49 y.o. female, with a history of anemia, DVT, GERD, presenting to the ED primarily complaining of right calf pain beginning and worsening since Friday, May 14.  Pain is aching, 7/10, nonradiating. She states she had a DVT about 30 years ago and this is her concern with this pain.  She also notes aching in other areas of her bilateral lower extremities.  She complains of several days of headache.  Headache is intermittent, feels like a tightness in the bilateral back of the head and neck, 3/10.  This is also associated with tightness in her neck and shoulders.  This headache arose after her concern with the right leg pain, however, she states she has had similar headaches in the past typically associated with her job. A couple days ago, she also began experiencing nasal congestion and rhinorrhea.  She then developed intermittent, mild, pressure type pain in the bilateral frontal region of the head.  Occasional nausea.  She also complains about constant, mild, central chest aching.  Occasional palpitations, which she states she has had before and for which she has been evaluated by cardiologist. She states she does not have a diagnosed history of hypertension, however, her blood pressure has been higher than normal the last few days.  She suspects this may be due to stress.  Denies fever/chills, cough, shortness of breath, abdominal pain, dizziness, syncope, numbness, weakness, vision changes, vomiting, diarrhea, leg swelling/color change, or any other complaints.  Past Medical History:  Diagnosis Date  . Anemia   . Colon polyp 12/25/2011   Tubular adenomatous  . Depression    following death of mother  . Duodenitis   .  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 Esophagitis   . Fatigue   . Gallstones   . GERD (gastroesophageal reflux disease)   . Hemorrhagic ovarian cyst 05/22/2012   04/2012   . Infection    UTI  . Neuromuscular disorder ([place])    hiatal hernia  . Obesity   . PUD (peptic ulcer disease)   . Sacroiliac pain   . Vaginal Pap smear, abnormal    colpo, results ok  . Vitamin D deficiency     Patient Active Problem List   Diagnosis Date Noted  . 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 Loud snoring 08/07/2019  . Chronic pain of left knee 07/03/2018  . Onychomycosis 05/17/2018  . Tinea pedis of both feet 05/17/2018  . Prediabetes 11/16/2014  . Obesity 11/16/2014  . Essential hypertension 11/16/2014  . Mixed hyperlipidemia 10/06/2013  . Cholelithiasis 05/22/2012  . Benign colonic polyp 01/15/2012  . Constipation 12/12/2011  . Vitamin D deficiency 12/12/2011  . 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 GERD (gastroesophageal reflux disease) 01/24/2007    Past Surgical History:  Procedure Laterality Date  . BREAST EXCISIONAL BIOPSY Left    benign  . COLPOSCOPY  2012   NEG  . DILATATION & CURETTAGE/HYSTEROSCOPY WITH MYOSURE N/A 01/17/2017   Procedure: [place];  Surgeon: Servando Salina, MD;  Location: [facility];  Service: Gynecology;  Laterality: N/A;  . TUBAL LIGATION       OB History  Gravida  3   Para  2   Term  2   Preterm      AB  1   Living  2     SAB  1   TAB      Ectopic      Multiple      Live Births              Family History  Problem Relation Age of Onset  . Aneurysm Mother   . Heart disease Mother   . Hypertension Mother   . Stroke Mother   . Lupus Mother   . Diabetes Maternal Aunt   . Cancer Maternal Aunt   . Colon cancer Neg Hx   . Esophageal cancer Neg Hx   . Stomach cancer Neg Hx   . Rectal cancer Neg Hx     Social History   Tobacco Use  . Smoking status:  Current Some Day Smoker    Packs/day: 0.10    Years: 5.00    Pack years: 0.50    Types: Cigarettes  . Smokeless tobacco: Never Used  Substance Use Topics  . Alcohol use: Not Currently    Alcohol/week: 0.0 standard drinks    Comment: occasional glass of wine  . Drug use: No    Home Medications Prior to Admission medications   Medication Sig Start Date End Date Taking? Authorizing Provider  DULoxetine (CYMBALTA) 60 MG capsule Take 1 capsule (60 mg total) by mouth daily. 08/07/19  Yes Nche, Charlene Brooke, NP  meloxicam (MOBIC) 7.5 MG tablet Take 1 tablet (7.5 mg total) by mouth daily. Patient taking differently: Take 7.5 mg by mouth daily as needed for pain.  08/19/19  Yes Jaynee Eagles, PA-C  Multiple Vitamin (MULTIVITAMIN WITH MINERALS) TABS tablet Take 1 tablet by mouth daily.   Yes [provider]  VITAMIN E PO Take 1 capsule by mouth daily.    Yes [provider]  methocarbamol (ROBAXIN) 500 MG tablet Take 1 tablet (500 mg total) by mouth 2 (two) times daily. 04/14/20   Aubreana Cornacchia C, PA-C  miconazole (MICOTIN) 2 % powder Apply topically daily. Patient not taking: Reported on 04/14/2020 05/26/19   Nche, Charlene Brooke, NP  ondansetron (ZOFRAN-ODT) 8 MG disintegrating tablet Take 1 tablet (8 mg total) by mouth every 8 (eight) hours as needed for nausea or vomiting. Patient not taking: Reported on 04/14/2020 08/19/19   Jaynee Eagles, PA-C    Allergies    Penicillins  Review of Systems   Review of Systems  Constitutional: Negative for chills, diaphoresis and fever.  HENT: Positive for congestion, rhinorrhea and sinus pressure.   Eyes: Negative for visual disturbance.  Respiratory: Negative for cough and shortness of breath.   Cardiovascular: Positive for chest pain. Negative for leg swelling.  Gastrointestinal: Positive for nausea. Negative for abdominal pain, blood in stool, diarrhea and vomiting.  Musculoskeletal: Negative for back pain and neck stiffness.  Neurological:  Positive for headaches. Negative for dizziness, syncope, weakness and numbness.  Psychiatric/Behavioral: Negative for confusion.  All other systems reviewed and are negative.   Physical Exam Updated Vital Signs BP (!) 141/110   Pulse 100   Temp 98.6 F (37 C) (Oral)   Resp 19   LMP 08/13/2013 Comment: negative urine pregnancy test 12-2013  SpO2 100%   Physical Exam Vitals and nursing note reviewed.  Constitutional:      General: She is not in acute distress.    Appearance: She is well-developed. She is  not diaphoretic.  HENT:     Head: Normocephalic and atraumatic.     Nose: Mucosal edema, congestion and rhinorrhea present.     Right Sinus: No maxillary sinus tenderness or frontal sinus tenderness.     Left Sinus: No maxillary sinus tenderness or frontal sinus tenderness.     Mouth/Throat:     Mouth: Mucous membranes are moist.     Pharynx: Oropharynx is clear.  Eyes:     Conjunctiva/sclera: Conjunctivae normal.  Cardiovascular:     Rate and Rhythm: Normal rate and regular rhythm.     Pulses: Normal pulses.          Radial pulses are 2+ on the right side and 2+ on the left side.       Dorsalis pedis pulses are 2+ on the right side and 2+ on the left side.       Posterior tibial pulses are 2+ on the right side and 2+ on the left side.     Heart sounds: Normal heart sounds.     Comments: Tactile temperature in the extremities appropriate and equal bilaterally. Pulmonary:     Effort: Pulmonary effort is normal. No respiratory distress.     Breath sounds: Normal breath sounds.  Abdominal:     Palpations: Abdomen is soft.     Tenderness: There is no abdominal tenderness. There is no guarding.  Musculoskeletal:     Cervical back: Neck supple.     Right lower leg: No edema.     Left lower leg: No edema.     Comments: Some tenderness to the right calf, mostly on the lateral side.  No noted swelling, color change, or temperature abnormality. No tenderness or pain throughout the  rest of the right lower extremity or in the left lower extremity.  When the cervical spinal musculature and bilateral trapezii are palpated, patient states, "These areas feel tight, like I need a massage."  Lymphadenopathy:     Cervical: No cervical adenopathy.  Skin:    General: Skin is warm and dry.  Neurological:     Mental Status: She is alert and oriented to person, place, and time.     Comments: No noted acute cognitive deficit. Sensation grossly intact to light touch in the extremities.   Grip strengths equal bilaterally.   Strength 5/5 in all extremities.  No gait disturbance.  Coordination intact.  Cranial nerves III-XII grossly intact.  Handles oral secretions without noted difficulty.  No noted phonation or speech deficit. No facial droop.   Psychiatric:        Mood and Affect: Mood and affect normal.        Speech: Speech normal.        Behavior: Behavior normal.     ED Results / Procedures / Treatments   Labs (all labs ordered are listed, but only abnormal results are displayed) Labs Reviewed  BASIC METABOLIC PANEL - Abnormal; Notable for the following components:      Result Value   Glucose, Bld 112 (*)    All other components within normal limits  CBC WITH DIFFERENTIAL/PLATELET - Abnormal; Notable for the following components:   WBC 3.5 (*)    Neutro Abs 1.3 (*)    All other components within normal limits  SARS CORONAVIRUS 2 BY RT PCR (HOSPITAL ORDER, [facility])  TROPONIN I (HIGH SENSITIVITY)    WBC  Date Value Ref Range Status  04/14/2020 3.5 (L) 4.0 - 10.5 K/uL Final  04/25/2019 4.3 4.0 - 10.5 K/uL Final  09/27/2017 5.3 3.8 - 10.8 Thousand/uL Final  01/12/2017 5.1 4.0 - 10.5 K/uL Final    EKG EKG Interpretation  Date/Time:  Wednesday Apr 14 2020 12:59:35 EDT Ventricular Rate:  80 PR Interval:    QRS Duration: 92 QT Interval:  365 QTC Calculation: 421 R Axis:   41 Text Interpretation: Sinus rhythm Confirmed by  Milton Ferguson [phone]) on 04/14/2020 3:39:57 PM   Radiology DG Chest 2 View  Result Date: 04/14/2020 CLINICAL DATA:  Chest pain. EXAM: CHEST - 2 VIEW COMPARISON:  CT angiogram chest 02/23/2019, chest radiograph 02/23/2019. FINDINGS: Heart size within normal limits. There is no appreciable airspace consolidation. No evidence of pleural effusion or pneumothorax. No acute bony abnormality identified. IMPRESSION: No evidence of acute cardiopulmonary abnormality. Electronically Signed   By: Kellie Simmering DO   On: 04/14/2020 13:50   VAS [place] LOWER EXTREMITY VENOUS (DVT) (ONLY [facility] & [facility] 7a-7p)  Result Date: 04/14/2020  Lower Venous DVTStudy Indications: Pain, and Swelling.  Comparison Study: No prior study Performing Technologist: Maudry Mayhew MHA, RDMS, RVT, RDCS  Examination Guidelines: A complete evaluation includes B-mode imaging, spectral Doppler, color Doppler, and power Doppler as needed of all accessible portions of each vessel. Bilateral testing is considered an integral part of a complete examination. Limited examinations for reoccurring indications may be performed as noted. The reflux portion of the exam is performed with the patient in reverse Trendelenburg.  +---------+---------------+---------+-----------+----------+--------------+ RIGHT    CompressibilityPhasicitySpontaneityPropertiesThrombus Aging +---------+---------------+---------+-----------+----------+--------------+ CFV      Full           Yes      Yes                                 +---------+---------------+---------+-----------+----------+--------------+ SFJ      Full                                                        +---------+---------------+---------+-----------+----------+--------------+ FV Prox  Full                                                        +---------+---------------+---------+-----------+----------+--------------+ FV Mid   Full                                                         +---------+---------------+---------+-----------+----------+--------------+ FV DistalFull                                                        +---------+---------------+---------+-----------+----------+--------------+ PFV      Full                                                        +---------+---------------+---------+-----------+----------+--------------+  POP      Full           Yes      Yes                                 +---------+---------------+---------+-----------+----------+--------------+ PTV      Full                                                        +---------+---------------+---------+-----------+----------+--------------+ PERO     Full                                                        +---------+---------------+---------+-----------+----------+--------------+   +----+---------------+---------+-----------+----------+--------------+ LEFTCompressibilityPhasicitySpontaneityPropertiesThrombus Aging +----+---------------+---------+-----------+----------+--------------+ CFV Full           Yes      Yes                                 +----+---------------+---------+-----------+----------+--------------+     Summary: RIGHT: - There is no evidence of deep vein thrombosis in the lower extremity.  - No cystic structure found in the popliteal fossa.  LEFT: - No evidence of common femoral vein obstruction.  *See table(s) above for measurements and observations. Electronically signed by Harold Barban MD on 04/14/2020 at 4:52:53 PM.    Final     Procedures Procedures (including critical care time)  Medications Ordered in ED Medications  sodium chloride 0.9 % bolus 1,000 mL (0 mLs Intravenous Stopped 04/14/20 1647)  dexamethasone (DECADRON) injection 10 mg (10 mg Intravenous Given 04/14/20 1250)  metoCLOPramide (REGLAN) injection 10 mg (10 mg Intravenous Given 04/14/20 1250)    ED Course  I have reviewed the triage vital signs and the  nursing notes.  Pertinent labs & imaging results that were available during my care of the patient were reviewed by me and considered in my medical decision making (see chart for details).    MDM Rules/Calculators/A&P                      Patient presents with a variety of complaints, but primarily focusing on her right leg pain. Patient is nontoxic appearing, afebrile, not tachycardic, not tachypneic, not hypotensive, maintains excellent SPO2 on room air, and is in no apparent distress.  No focal neurologic deficits noted. I have reviewed the patient's chart to obtain more information.  I reviewed and interpreted the patient's labs and radiological studies. She does have a mild leukopenia.  Covid infection is a consideration here.    Due to the patient's duration of her chest soreness, a single troponin was thought to be adequate.  No acute abnormalities on EKG.  No evidence of ischemia.  No acute abnormality on chest x-ray.  Dissection thought less likely due to the lack of risk factors and patient's presentation and exam were not suggestive.  She has an appointment to see her PCP for these symptoms on Monday, May 24.  She has had 1 dose of the Pfizer Covid vaccine about 2 weeks ago.  The patient  was given instructions for home care as well as return precautions. Patient voices understanding of these instructions, accepts the plan, and is comfortable with discharge.    We discussed the patient's headache symptoms at length.  We also discussed her mother's ruptured aneurysm.  Patient states this thought crossed her mind when she started having her recent headaches. I discussed options for evaluation for possible aneurysm, including imaging studies here in the ED versus working through her PCP.  Patient states, "I just want to focus on my leg pain for now." Based on the intermittent symptoms, lack of risk factors, description of symptoms, other explanations for the patient's headaches in the  reported locations, my suspicion for intracranial hemorrhage or other intracranial emergency is low at this time.    NICO DADA was evaluated in Emergency Department on 04/14/2020 for the symptoms described in the history of present illness. She was evaluated in the context of the global COVID-19 pandemic, which necessitated consideration that the patient might be at risk for infection with the SARS-CoV-2 virus that causes COVID-19. Institutional protocols and algorithms that pertain to the evaluation of patients at risk for COVID-19 are in a state of rapid change based on information released by regulatory bodies including the CDC and federal and state organizations. These policies and algorithms were followed during the patient's care in the ED.   Vitals:   04/14/20 1430 04/14/20 1500 04/14/20 1530 04/14/20 1600  BP: 131/75 117/72 (!) 142/87 140/74  Pulse: 84 79 79 78  Resp: 17 17 15 18   Temp:      TempSrc:      SpO2: 100% 97% 97% 98%     Final Clinical Impression(s) / ED Diagnoses Final diagnoses:  Right leg pain  Nasal congestion  Recurrent headache    Rx / DC Orders ED Discharge Orders         Ordered    methocarbamol (ROBAXIN) 500 MG tablet  2 times daily     04/14/20 1633           Lorayne Bender, PA-C 04/14/20 1708    Milton Ferguson, MD 04/14/20 1728

## 2020-04-14 NOTE — Progress Notes (Signed)
Right lower extremity venous duplex completed. Refer to "CV Proc" under chart review to view preliminary results.  04/14/2020 3:18 PM Kelby Aline., MHA, RVT, RDCS, RDMS

## 2020-04-19 ENCOUNTER — Ambulatory Visit: Payer: Self-pay | Admitting: Nurse Practitioner

## 2020-04-19 ENCOUNTER — Other Ambulatory Visit: Payer: Self-pay

## 2020-04-27 ENCOUNTER — Ambulatory Visit: Payer: Self-pay | Admitting: Nurse Practitioner

## 2020-04-27 DIAGNOSIS — Z0289 Encounter for other administrative examinations: Secondary | ICD-10-CM

## 2020-05-07 ENCOUNTER — Encounter: Payer: Self-pay | Admitting: Nurse Practitioner

## 2020-05-26 ENCOUNTER — Other Ambulatory Visit: Payer: Self-pay

## 2020-05-27 ENCOUNTER — Telehealth: Payer: Self-pay | Admitting: Nurse Practitioner

## 2020-05-27 ENCOUNTER — Ambulatory Visit (INDEPENDENT_AMBULATORY_CARE_PROVIDER_SITE_OTHER): Payer: PRIVATE HEALTH INSURANCE | Admitting: Nurse Practitioner

## 2020-05-27 ENCOUNTER — Encounter: Payer: Self-pay | Admitting: Nurse Practitioner

## 2020-05-27 VITALS — BP 118/82 | HR 90 | Temp 97.1°F | Ht 66.0 in | Wt 220.4 lb

## 2020-05-27 DIAGNOSIS — E559 Vitamin D deficiency, unspecified: Secondary | ICD-10-CM | POA: Diagnosis not present

## 2020-05-27 DIAGNOSIS — R7303 Prediabetes: Secondary | ICD-10-CM | POA: Diagnosis not present

## 2020-05-27 DIAGNOSIS — F3341 Major depressive disorder, recurrent, in partial remission: Secondary | ICD-10-CM | POA: Diagnosis not present

## 2020-05-27 DIAGNOSIS — G4481 Hypnic headache: Secondary | ICD-10-CM | POA: Diagnosis not present

## 2020-05-27 DIAGNOSIS — F5101 Primary insomnia: Secondary | ICD-10-CM

## 2020-05-27 LAB — TSH: TSH: 0.86 u[IU]/mL (ref 0.35–4.50)

## 2020-05-27 LAB — HEMOGLOBIN A1C: Hgb A1c MFr Bld: 6.1 % (ref 4.6–6.5)

## 2020-05-27 MED ORDER — DULOXETINE HCL 60 MG PO CPEP: 60.0000 mg | ORAL_CAPSULE | Freq: Every day | ORAL | 3 refills | Status: DC

## 2020-05-27 MED ORDER — TRAZODONE HCL 50 MG PO TABS: 25.0000 mg | ORAL_TABLET | Freq: Every day | ORAL | 1 refills | Status: DC

## 2020-05-27 MED ORDER — TOPIRAMATE 25 MG PO TABS: ORAL_TABLET | ORAL | 2 refills | Status: DC

## 2020-05-27 MED ORDER — SUMATRIPTAN SUCCINATE 25 MG PO TABS: 25.0000 mg | ORAL_TABLET | ORAL | 0 refills | Status: DC | PRN

## 2020-05-27 NOTE — Assessment & Plan Note (Signed)
>>  ASSESSMENT AND PLAN FOR HYPNIC HEADACHE WRITTEN ON 05/27/2020  1:09 PM BY Hallie Ertl LUM, NP  Normal Ct head 07/2019 Entered referral to neurology Start topamax  daily and imitrex  prn. F/up in 52month

## 2020-05-27 NOTE — Telephone Encounter (Signed)
Informed her that I entered another referral to neurology. She will need repeat sleep study. A head CT was completed 07/2019 which was normal. There is not need to repeat. Do not pick up trazodone. changed to topamax to help with headache and sleep. Need to f/up in 88month

## 2020-05-27 NOTE — Patient Instructions (Signed)
Go to lab for blood draw.

## 2020-05-27 NOTE — Progress Notes (Signed)
Subjective:  Patient ID: Rebecca [name], female    DOB: 1970-12-11  Age: 49 y.o. MRN: [id number]  CC: Migraine (pt states shes had a headache for last 3 days//concerned about diabetes)  Headache  This is a chronic problem. The current episode started more than 1 year ago. The problem occurs intermittently. The problem has been waxing and waning. The pain is located in the right unilateral region. The pain does not radiate. The pain quality is similar to prior headaches. The quality of the pain is described as aching and throbbing. Associated symptoms include eye pain, eye watering and insomnia. Pertinent negatives include no abdominal pain, anorexia, blurred vision, dizziness, drainage, ear pain, eye redness, muscle aches, nausea, rhinorrhea, seizures, sinus pressure, tingling, tinnitus, visual change, vomiting or weakness. The symptoms are aggravated by emotional stress. She has tried NSAIDs for the symptoms. The treatment provided moderate relief. Her past medical history is significant for cluster headaches, hypertension and obesity. There is no history of migraine headaches, migraines in the family, pseudotumor cerebri, recent head traumas or sinus disease.   BP at goal. BP Readings from Last 3 Encounters:  05/27/20 118/82  04/14/20 140/74  08/19/19 (!) 152/93   Headache and Insomnia: Persistent insomnia, snoring and daytime fatigue, no improvement with benadryl, melatonin, and Ambien. Snoring with sleep, last sleep study 2015 Normal head CT 07/2019. Did not schedule appt with neurology as recommended last year  Anxiety and depression: No change in mood, current use of cymbalta. Depression screen [facility] Fall 2/9 05/27/2020 08/06/2019 08/06/2019  Decreased Interest 3 3 0  Down, Depressed, Hopeless 1 0 0  PHQ - 2 Score 4 3 0  Altered sleeping 3 3 -  Tired, decreased energy 3 3 -  Change in appetite 1 1 -  Feeling bad or failure about yourself  0 0 -  Trouble concentrating 3 3 -  Moving slowly  or fidgety/restless 1 0 -  Suicidal thoughts 0 0 -  PHQ-9 Score 15 13 -   GAD 7 : Generalized Anxiety Score 05/27/2020 08/06/2019  Nervous, Anxious, on Edge 3 0  Control/stop worrying 3 3  Worry too much - different things 3 3  Trouble relaxing 2 3  Restless 0 0  Easily annoyed or irritable 3 3  Afraid - awful might happen 1 3  Total GAD 7 Score 15 15   Reviewed past Medical, Social and Family history today.  Outpatient Medications Prior to Visit  Medication Sig Dispense Refill  . meloxicam (MOBIC) 7.5 MG tablet Take 1 tablet (7.5 mg total) by mouth daily. (Patient taking differently: Take 7.5 mg by mouth daily as needed for pain. ) 30 tablet 0  . methocarbamol (ROBAXIN) 500 MG tablet Take 1 tablet (500 mg total) by mouth 2 (two) times daily. 20 tablet 0  . miconazole (MICOTIN) 2 % powder Apply topically daily. 70 g 0  . Multiple Vitamin (MULTIVITAMIN WITH MINERALS) TABS tablet Take 1 tablet by mouth daily.    . ondansetron (ZOFRAN-ODT) 8 MG disintegrating tablet Take 1 tablet (8 mg total) by mouth every 8 (eight) hours as needed for nausea or vomiting. 20 tablet 0  . VITAMIN E PO Take 1 capsule by mouth daily.     . DULoxetine (CYMBALTA) 60 MG capsule Take 1 capsule (60 mg total) by mouth daily. 90 capsule 3   No facility-administered medications prior to visit.    ROS See HPI  Objective:  BP 118/82   Pulse 90   Temp (!) 97.1  F (36.2 C) (Tympanic)   Ht 5\' 6"  (1.676 m)   Wt 220 lb 6.4 oz (100 kg)   LMP 08/13/2013 Comment: negative urine pregnancy test 12-2013  SpO2 98%   BMI 35.57 kg/m   BP Readings from Last 3 Encounters:  05/27/20 118/82  04/14/20 140/74  08/19/19 (!) 152/93   Wt Readings from Last 3 Encounters:  05/27/20 220 lb 6.4 oz (100 kg)  01/01/20 238 lb 9.6 oz (108.2 kg)  08/06/19 227 lb 9.6 oz (103.2 kg)   Physical Exam Constitutional:      Appearance: She is obese.  HENT:     Right Ear: Tympanic membrane, ear canal and external ear normal.     Left  Ear: Tympanic membrane, ear canal and external ear normal.  Eyes:     Extraocular Movements: Extraocular movements intact.     Conjunctiva/sclera: Conjunctivae normal.     Pupils: Pupils are equal, round, and reactive to light.  Cardiovascular:     Rate and Rhythm: Normal rate and regular rhythm.     Pulses: Normal pulses.     Heart sounds: Normal heart sounds.  Pulmonary:     Effort: Pulmonary effort is normal.     Breath sounds: Normal breath sounds.  Musculoskeletal:     Cervical back: Normal range of motion and neck supple.  Lymphadenopathy:     Cervical: No cervical adenopathy.  Neurological:     Mental Status: She is alert and oriented to person, place, and time.  Psychiatric:        Mood and Affect: Mood normal.        Behavior: Behavior normal.        Thought Content: Thought content normal.     Lab Results  Component Value Date   WBC 3.5 (L) 04/14/2020   HGB 13.2 04/14/2020   HCT 41.4 04/14/2020   PLT 260 04/14/2020   GLUCOSE 112 (H) 04/14/2020   CHOL 164 08/06/2019   TRIG 80.0 08/06/2019   HDL 45.30 08/06/2019   LDLCALC 103 (H) 08/06/2019   ALT 24 08/06/2019   AST 18 08/06/2019   NA 141 04/14/2020   K 4.5 04/14/2020   CL 106 04/14/2020   CREATININE 0.73 04/14/2020   BUN 15 04/14/2020   CO2 28 04/14/2020   TSH 1.00 08/06/2019   HGBA1C 6.2 08/06/2019   MICROALBUR 0.3 09/27/2017    Assessment & [name]:  This visit occurred during the SARS-CoV-2 public health emergency.  Safety protocols were in place, including screening questions prior to the visit, additional usage of staff PPE, and extensive cleaning of exam room while observing appropriate contact time as indicated for disinfecting solutions.   Rebecca [name] was seen today for migraine.  Diagnoses and all orders for this visit:  Hypnic headache -     SUMAtriptan (IMITREX) 25 MG tablet; Take 1 tablet (25 mg total) by mouth every 2 (two) hours as needed for migraine. Do not take more than 100mg  in 24hrs -      topiramate (TOPAMAX) 25 MG tablet; 1tab at bedtime x 2weeks, then 1tab BID continuous  Prediabetes -     Hemoglobin A1c  Depression, major, recurrent, in partial remission (HCC) -     TSH -     DULoxetine (CYMBALTA) 60 MG capsule; Take 1 capsule (60 mg total) by mouth daily.  Vitamin D deficiency -     Vitamin D 1,25 dihydroxy  Primary insomnia -     Discontinue: traZODone (DESYREL) 50 MG tablet; Take 0.5-1  tablets (25-50 mg total) by mouth at bedtime. -     Ambulatory referral to Neurology   I have discontinued Florence traZODone. I am also having her start on SUMAtriptan and topiramate. Additionally, I am having her maintain her multivitamin with minerals, miconazole, meloxicam, ondansetron, VITAMIN E PO, methocarbamol, and DULoxetine.  Meds ordered this encounter  Medications  . SUMAtriptan (IMITREX) 25 MG tablet    Sig: Take 1 tablet (25 mg total) by mouth every 2 (two) hours as needed for migraine. Do not take more than 100mg  in 24hrs    Dispense:  10 tablet    Refill:  0    Order Specific Question:   Supervising Provider    Answer:   Ronnald Nian [[id number]]  . DISCONTD: traZODone (DESYREL) 50 MG tablet    Sig: Take 0.5-1 tablets (25-50 mg total) by mouth at bedtime.    Dispense:  30 tablet    Refill:  1    Order Specific Question:   Supervising Provider    Answer:   Ronnald Nian [id number]  . topiramate (TOPAMAX) 25 MG tablet    Sig: 1tab at bedtime x 2weeks, then 1tab BID continuous    Dispense:  60 tablet    Refill:  2    Discontinue trazodone    Order Specific Question:   Supervising Provider    Answer:   Ronnald Nian [[id number]]  . DULoxetine (CYMBALTA) 60 MG capsule    Sig: Take 1 capsule (60 mg total) by mouth daily.    Dispense:  90 capsule    Refill:  3    Order Specific Question:   Supervising Provider    Answer:   Ronnald Nian [[id number]]    Problem List Items Addressed This Visit      Other   Depression, major,  recurrent, in partial remission ([place])   Relevant Medications   DULoxetine (CYMBALTA) 60 MG capsule   Other Relevant Orders   TSH   Hypnic headache - Primary    Normal Ct head 07/2019 Entered referral to neurology Start topamax daily and imitrex prn. F/up in 51month      Relevant Medications   SUMAtriptan (IMITREX) 25 MG tablet   topiramate (TOPAMAX) 25 MG tablet   DULoxetine (CYMBALTA) 60 MG capsule   Prediabetes   Relevant Orders   Hemoglobin A1c   Vitamin D deficiency   Relevant Orders   Vitamin D 1,25 dihydroxy    Other Visit Diagnoses    Primary insomnia       Relevant Orders   Ambulatory referral to Neurology      Follow-up: Return in about 4 weeks (around 06/24/2020) for insomnia and headache (video).  Wilfred Lacy, NP

## 2020-05-27 NOTE — Telephone Encounter (Signed)
Pt was contacted and she verbally understood.

## 2020-05-27 NOTE — Assessment & Plan Note (Signed)
Normal Ct head 07/2019 Entered referral to neurology Start topamax daily and imitrex prn. F/up in 63month

## 2020-05-30 LAB — VITAMIN D 1,25 DIHYDROXY
Vitamin D 1, 25 (OH)2 Total: 37 pg/mL (ref 18–72)
Vitamin D2 1, 25 (OH)2: <8 pg/mL
Vitamin D3 1, 25 (OH)2: 37 pg/mL

## 2020-06-03 ENCOUNTER — Encounter: Payer: Self-pay | Admitting: Internal Medicine

## 2020-06-07 NOTE — Progress Notes (Signed)
Written by Flossie Buffy, NP on 06/07/2020  1:29 AM EDT Seen by patient Rebecca [name] on 06/07/2020  1:41 AM

## 2020-06-13 ENCOUNTER — Encounter: Payer: Self-pay | Admitting: Nurse Practitioner

## 2020-07-13 ENCOUNTER — Encounter: Payer: PRIVATE HEALTH INSURANCE | Admitting: Internal Medicine

## 2020-08-06 ENCOUNTER — Other Ambulatory Visit: Payer: Self-pay

## 2020-08-06 ENCOUNTER — Ambulatory Visit (HOSPITAL_COMMUNITY)
Admission: EM | Admit: 2020-08-06 | Discharge: 2020-08-06 | Disposition: A | Discharge status: Home / Self Care | Payer: PRIVATE HEALTH INSURANCE

## 2020-08-06 ENCOUNTER — Telehealth: Payer: PRIVATE HEALTH INSURANCE | Admitting: Physician Assistant

## 2020-08-06 DIAGNOSIS — N390 Urinary tract infection, site not specified: Secondary | ICD-10-CM

## 2020-08-06 MED ORDER — NITROFURANTOIN MONOHYD MACRO 100 MG PO CAPS
100.0000 mg | ORAL_CAPSULE | Freq: Two times a day (BID) | ORAL | 0 refills | Status: DC
Start: 2020-08-06 — End: 2020-10-19

## 2020-08-06 NOTE — Progress Notes (Signed)
We are sorry that you are not feeling well.  Here is how we plan to help!  Based on what you shared with me it looks like you most likely have a simple urinary tract infection.  A UTI (Urinary Tract Infection) is a bacterial infection of the bladder.  Most cases of urinary tract infections are simple to treat but a key part of your care is to encourage you to drink plenty of fluids and watch your symptoms carefully.  I have prescribed MacroBid 100 mg twice a day for 7 days.  Your symptoms should gradually improve. Call us if the burning in your urine worsens, you develop worsening fever, back pain or pelvic pain or if your symptoms do not resolve after completing the antibiotic.  Urinary tract infections can be prevented by drinking plenty of water to keep your body hydrated.  Also be sure when you wipe, wipe from front to back and don't hold it in!  If possible, empty your bladder every 4 hours.  Your e-visit answers were reviewed by a board certified advanced clinical practitioner to complete your personal care plan.  Depending on the condition, your plan could have included both over the counter or prescription medications.  If there is a problem please reply  once you have received a response from your provider.  Your safety is important to [place].  If you have drug allergies check your prescription carefully.    You can use MyChart to ask questions about today's visit, request a non-urgent call back, or ask for a work or school excuse for 24 hours related to this e-Visit. If it has been greater than 24 hours you will need to follow up with your provider, or enter a new e-Visit to address those concerns.   You will get an e-mail in the next two days asking about your experience.  I hope that your e-visit has been valuable and will speed your recovery. Thank you for using e-visits.  Greater than 5 minutes, yet less than 10 minutes of time have been spent researching, coordinating, and  implementing care for this patient today

## 2020-10-17 NOTE — Progress Notes (Signed)
Cardiology Office Note   Date:  10/19/2020   ID:  Rebecca [name], DOB 25-Nov-1971, MRN [id number]  PCP:  Flossie Buffy, NP    No chief complaint on file.  Chest pain  Wt Readings from Last 3 Encounters:  10/19/20 215 lb 3.2 oz (97.6 kg)  05/27/20 220 lb 6.4 oz (100 kg)  01/01/20 238 lb 9.6 oz (108.2 kg)       History of Present Illness: Rebecca [name] is a 49 y.o. female  Rebecca [name] is a 49 y.o. female with an episode chest pressure in 03/2019.  She has had anxiety atacks in the past.  She went to the ER with this episode.  History was as follows: "with PMH of anemia, DVT in pregnancy, Esophagitis, HTN, and HLDpresents to the emergency department for evaluation of shoulder and chest discomfort starting yesterday. Patient had dental work done at 7 AM yesterday. She noticed some bilateral shoulder discomfort during that appointment. She noticed the pain throughout the day and later in the afternoon/evening timeframe yesterday she developed central chest pressure.Symptoms became severe overnight and has had constant pain for most of the night and early morning. Patient was unable to relieve her symptoms at home. She did have some nausea but no vomiting. She had one small episode of diarrhea.She denies any abdominal pain.No shortness of breath.No pleuritic discomfort.Patient also notes taking amoxicillin yesterday after her dental procedure. She had outbreak of rash and itching which did resolve. She has not taken additional antibiotic this morning. Denies any fevers."   Coronary CT in 05/2019 showed calcium score of 0.   ER visit in 5/21 for chest pain with negative w/u.   ER visit in 11/21 at [facility].  She felt palpitations and went to ER.  HR was 125, sinus tach per the records.  Troponin negative.  Normal CXR.  She was sent home.   Palpitations have been associated with anxiety in the past.  She has had a couple of episodes in the past few  months.  She has had a little more stress than usual.    She got her initial COVID vaccines. She had a sore arm and fatigue.   She drinks 2 cups a day of coffee.  Cutting back. Trying to stop smoking.   She has lost weight.  TSH normal in 7/21.   Past Medical History:  Diagnosis Date  . Anemia   . Colon polyp 12/25/2011   Tubular adenomatous  . Depression    following death of mother  . Duodenitis   . 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 Esophagitis   . Fatigue   . Gallstones   . GERD (gastroesophageal reflux disease)   . Hemorrhagic ovarian cyst 05/22/2012   04/2012   . Infection    UTI  . Neuromuscular disorder ([place])    hiatal hernia  . Obesity   . PUD (peptic ulcer disease)   . Sacroiliac pain   . Vaginal Pap smear, abnormal    colpo, results ok  . Vitamin D deficiency     Past Surgical History:  Procedure Laterality Date  . BREAST EXCISIONAL BIOPSY Left    benign  . COLPOSCOPY  2012   NEG  . DILATATION & CURETTAGE/HYSTEROSCOPY WITH MYOSURE N/A 01/17/2017   Procedure: [place];  Surgeon: Servando Salina, MD;  Location: [facility];  Service: Gynecology;  Laterality: N/A;  . TUBAL LIGATION  Current Outpatient Medications  Medication Sig Dispense Refill  . DULoxetine (CYMBALTA) 60 MG capsule Take 1 capsule (60 mg total) by mouth daily. 90 capsule 3  . Multiple Vitamin (MULTIVITAMIN WITH MINERALS) TABS tablet Take 1 tablet by mouth daily.    . SUMAtriptan (IMITREX) 25 MG tablet Take 1 tablet (25 mg total) by mouth every 2 (two) hours as needed for migraine. Do not take more than 100mg  in 24hrs 10 tablet 0   No current facility-administered medications for this visit.    Allergies:   Penicillins    Social History:  The patient  reports that she has been smoking cigarettes. She has a 0.50 pack-year smoking history. She has never used smokeless tobacco. She reports previous alcohol use. She reports that she does  not use drugs.   Family History:  The patient's family history includes Aneurysm in her mother; Cancer in her maternal aunt; Diabetes in her maternal aunt; Heart disease in her mother; Hypertension in her mother; Lupus in her mother; Stroke in her mother.    ROS:  Please see the history of present illness.   Otherwise, review of systems are positive for tachycardia.   All other systems are reviewed and negative.    PHYSICAL EXAM: VS:  BP (!) 118/92   Pulse 90   Ht 5\' 6"  (1.676 m)   Wt 215 lb 3.2 oz (97.6 kg)   LMP 08/13/2013 Comment: negative urine pregnancy test 12-2013  SpO2 97%   BMI 34.73 kg/m  , BMI Body mass index is 34.73 kg/m. GEN: Well nourished, well developed, in no acute distress  HEENT: normal  Neck: no JVD, carotid bruits, or masses Cardiac: RRR; no murmurs, rubs, or gallops,no edema  Respiratory:  clear to auscultation bilaterally, normal work of breathing GI: soft, nontender, nondistended, + BS MS: no deformity or atrophy  Skin: warm and dry, no rash Neuro:  Strength and sensation are intact Psych: euthymic mood, full affect   EKG:   The ekg report from [facility] ER demonstrates sinus tach   Recent Labs: 04/14/2020: BUN 15; Creatinine, Ser 0.73; Hemoglobin 13.2; Platelets 260; Potassium 4.5; Sodium 141 05/27/2020: TSH 0.86   Lipid Panel    Component Value Date/Time   CHOL 164 08/06/2019 0843   TRIG 80.0 08/06/2019 0843   HDL 45.30 08/06/2019 0843   CHOLHDL 4 08/06/2019 0843   VLDL 16.0 08/06/2019 0843   LDLCALC 103 (H) 08/06/2019 0843   LDLCALC 106 (H) 09/27/2017 1619     Other studies Reviewed: Additional studies/ records that were reviewed today with results demonstrating: ER records reviewed.   ASSESSMENT AND PLAN:  1. Chest pain: Atypical, controlled. Negative w/u a few weeks ago with prior normal coronary CTA.  No further ischemic testing.  2. Palpitations: Reduce caffeine to 1 cup of coffee daily.  Sinus tachycardia noted at ER visit.  She has had  some recent stressors to be contributing.  Continue to stay well-hydrated also. 3. Tobacco abuse:  Interested in stopping.  1 pack lasts 4 days.  Will start 14 mg nicotine patch.  We will also give information regarding 1 800 quit now. 4. Weight loss:  Unintentional. She will check with PMD.     Current medicines are reviewed at length with the patient today.  The patient concerns regarding her medicines were addressed.  The following changes have been made:   Labs/ tests ordered today include:  No orders of the defined types were placed in this encounter.   Recommend 150  minutes/week of aerobic exercise Low fat, low carb, high fiber diet recommended  Disposition:   FU in 1 year   Signed, Larae Grooms, MD  10/19/2020 9:45 AM    [facility] [address], [place], [place]  [address] Phone: [phone]; Fax: [phone]

## 2020-10-19 ENCOUNTER — Ambulatory Visit (INDEPENDENT_AMBULATORY_CARE_PROVIDER_SITE_OTHER): Payer: PRIVATE HEALTH INSURANCE | Admitting: Interventional Cardiology

## 2020-10-19 ENCOUNTER — Other Ambulatory Visit: Payer: Self-pay

## 2020-10-19 ENCOUNTER — Encounter: Payer: Self-pay | Admitting: Interventional Cardiology

## 2020-10-19 VITALS — BP 118/92 | HR 90 | Ht 66.0 in | Wt 215.2 lb

## 2020-10-19 DIAGNOSIS — R072 Precordial pain: Secondary | ICD-10-CM | POA: Diagnosis not present

## 2020-10-19 DIAGNOSIS — F17200 Nicotine dependence, unspecified, uncomplicated: Secondary | ICD-10-CM | POA: Diagnosis not present

## 2020-10-19 DIAGNOSIS — R002 Palpitations: Secondary | ICD-10-CM

## 2020-10-19 MED ORDER — NICOTINE 14 MG/24HR TD PT24
14.0000 mg | MEDICATED_PATCH | Freq: Every day | TRANSDERMAL | 0 refills | Status: DC
Start: 2020-10-19 — End: 2020-12-20

## 2020-10-19 NOTE — Patient Instructions (Signed)
Medication Instructions:  Your physician has recommended you make the following change in your medication:   START: Nicotine patch 14 mg: Use as directed  *If you need a refill on your cardiac medications before your next appointment, please call your pharmacy*   Lab Work: None   If you have labs (blood work) drawn today and your tests are completely normal, you will receive your results only by: Marland Kitchen MyChart Message (if you have MyChart) OR . A paper copy in the mail If you have any lab test that is abnormal or we need to change your treatment, we will call you to review the results.   Testing/Procedures: None   Follow-Up: At [facility], you and your health needs are our priority.  As part of our continuing mission to provide you with exceptional heart care, we have created designated Provider Care Teams.  These Care Teams include your primary Cardiologist (physician) and Advanced Practice Providers (APPs -  Physician Assistants and Nurse Practitioners) who all work together to provide you with the care you need, when you need it.  We recommend signing up for the patient portal called "MyChart".  Sign up information is provided on this After Visit Summary.  MyChart is used to connect with patients for Virtual Visits (Telemedicine).  Patients are able to view lab/test results, encounter notes, upcoming appointments, etc.  Non-urgent messages can be sent to your provider as well.   To learn more about what you can do with MyChart, go to [email or web address].    Your next appointment:   12 month(s)  The format for your next appointment:   In Person  Provider:   You may see Casandra Doffing, MD or one of the following Advanced Practice Providers on your designated Care Team:    Melina Copa, PA-C  Ermalinda Barrios, PA-C    Other Instructions  Steps to Quit Smoking Smoking tobacco is the leading cause of preventable death. It can affect almost every organ in the body. Smoking puts  you and people around you at risk for many serious, long-lasting (chronic) diseases. Quitting smoking can be hard, but it is one of the best things that you can do for your health. It is never too late to quit. How do I get ready to quit? When you decide to quit smoking, make a plan to help you succeed. Before you quit:  Pick a date to quit. Set a date within the next 2 weeks to give you time to prepare.  Write down the reasons why you are quitting. Keep this list in places where you will see it often.  Tell your family, friends, and co-workers that you are quitting. Their support is important.  Talk with your doctor about the choices that may help you quit.  Find out if your health insurance will pay for these treatments.  Know the people, places, things, and activities that make you want to smoke (triggers). Avoid them. What first steps can I take to quit smoking?  Throw away all cigarettes at home, at work, and in your car.  Throw away the things that you use when you smoke, such as ashtrays and lighters.  Clean your car. Make sure to empty the ashtray.  Clean your home, including curtains and carpets. What can I do to help me quit smoking? Talk with your doctor about taking medicines and seeing a counselor at the same time. You are more likely to succeed when you do both.  If you are pregnant or  breastfeeding, talk with your doctor about counseling or other ways to quit smoking. Do not take medicine to help you quit smoking unless your doctor tells you to do so. To quit smoking: Quit right away  Quit smoking totally, instead of slowly cutting back on how much you smoke over a period of time.  Go to counseling. You are more likely to quit if you go to counseling sessions regularly. Take medicine You may take medicines to help you quit. Some medicines need a prescription, and some you can buy over-the-counter. Some medicines may contain a drug called nicotine to replace the  nicotine in cigarettes. Medicines may:  Help you to stop having the desire to smoke (cravings).  Help to stop the problems that come when you stop smoking (withdrawal symptoms). Your doctor may ask you to use:  Nicotine patches, gum, or lozenges.  Nicotine inhalers or sprays.  Non-nicotine medicine that is taken by mouth. Find resources Find resources and other ways to help you quit smoking and remain smoke-free after you quit. These resources are most helpful when you use them often. They include:  Online chats with a Social worker.  Phone quitlines.  Printed Furniture conservator/restorer.  Support groups or group counseling.  Text messaging programs.  Mobile phone apps. Use apps on your mobile phone or tablet that can help you stick to your quit plan. There are many free apps for mobile phones and tablets as well as websites. Examples include Quit Guide from the [employer] and smokefree.gov  What things can I do to make it easier to quit?   Talk to your family and friends. Ask them to support and encourage you.  Call a phone quitline (1-800-QUIT-NOW), reach out to support groups, or work with a Social worker.  Ask people who smoke to not smoke around you.  Avoid places that make you want to smoke, such as: ? Bars. ? Parties. ? Smoke-break areas at work.  Spend time with people who do not smoke.  Lower the stress in your life. Stress can make you want to smoke. Try these things to help your stress: ? Getting regular exercise. ? Doing deep-breathing exercises. ? Doing yoga. ? Meditating. ? Doing a body scan. To do this, close your eyes, focus on one area of your body at a time from head to toe. Notice which parts of your body are tense. Try to relax the muscles in those areas. How will I feel when I quit smoking? Day 1 to 3 weeks Within the first 24 hours, you may start to have some problems that come from quitting tobacco. These problems are very bad 2-3 days after you quit, but they do  not often last for more than 2-3 weeks. You may get these symptoms:  Mood swings.  Feeling restless, nervous, angry, or annoyed.  Trouble concentrating.  Dizziness.  Strong desire for high-sugar foods and nicotine.  Weight gain.  Trouble pooping (constipation).  Feeling like you may vomit (nausea).  Coughing or a sore throat.  Changes in how the medicines that you take for other issues work in your body.  Depression.  Trouble sleeping (insomnia). Week 3 and afterward After the first 2-3 weeks of quitting, you may start to notice more positive results, such as:  Better sense of smell and taste.  Less coughing and sore throat.  Slower heart rate.  Lower blood pressure.  Clearer skin.  Better breathing.  Fewer sick days. Quitting smoking can be hard. Do not give up if you  fail the first time. Some people need to try a few times before they succeed. Do your best to stick to your quit plan, and talk with your doctor if you have any questions or concerns. Summary  Smoking tobacco is the leading cause of preventable death. Quitting smoking can be hard, but it is one of the best things that you can do for your health.  When you decide to quit smoking, make a plan to help you succeed.  Quit smoking right away, not slowly over a period of time.  When you start quitting, seek help from your doctor, family, or friends. This information is not intended to replace advice given to you by your health care provider. Make sure you discuss any questions you have with your health care provider. Document Revised: 08/08/2019 Document Reviewed: 02/01/2019 Elsevier Patient Education  [place].   High-Fiber Diet Fiber, also called dietary fiber, is a type of carbohydrate that is found in fruits, vegetables, whole grains, and beans. A high-fiber diet can have many health benefits. Your health care provider may recommend a high-fiber diet to help:  Prevent constipation.  Fiber can make your bowel movements more regular.  Lower your cholesterol.  Relieve the following conditions: ? Swelling of veins in the anus (hemorrhoids). ? Swelling and irritation (inflammation) of specific areas of the digestive tract (uncomplicated diverticulosis). ? A problem of the large intestine (colon) that sometimes causes pain and diarrhea (irritable bowel syndrome, IBS).  Prevent overeating as part of a weight-loss plan.  Prevent heart disease, type 2 diabetes, and certain cancers. What is my plan? The recommended daily fiber intake in grams (g) includes:  38 g for men age 68 or younger.  30 g for men over age 39.  12 g for women age 17 or younger.  21 g for women over age 44. You can get the recommended daily intake of dietary fiber by:  Eating a variety of fruits, vegetables, grains, and beans.  Taking a fiber supplement, if it is not possible to get enough fiber through your diet. What do I need to know about a high-fiber diet?  It is better to get fiber through food sources rather than from fiber supplements. There is not a lot of research about how effective supplements are.  Always check the fiber content on the nutrition facts label of any prepackaged food. Look for foods that contain 5 g of fiber or more per serving.  Talk with a diet and nutrition specialist (dietitian) if you have questions about specific foods that are recommended or not recommended for your medical condition, especially if those foods are not listed below.  Gradually increase how much fiber you consume. If you increase your intake of dietary fiber too quickly, you may have bloating, cramping, or gas.  Drink plenty of water. Water helps you to digest fiber. What are tips for following this plan?  Eat a wide variety of high-fiber foods.  Make sure that half of the grains that you eat each day are whole grains.  Eat breads and cereals that are made with whole-grain flour instead of  refined flour or white flour.  Eat brown rice, bulgur wheat, or millet instead of white rice.  Start the day with a breakfast that is high in fiber, such as a cereal that contains 5 g of fiber or more per serving.  Use beans in place of meat in soups, salads, and pasta dishes.  Eat high-fiber snacks, such as berries, raw  vegetables, nuts, and popcorn.  Choose whole fruits and vegetables instead of processed forms like juice or sauce. What foods can I eat?  Fruits Berries. Pears. Apples. Oranges. Avocado. Prunes and raisins. Dried figs. Vegetables Sweet potatoes. Spinach. Kale. Artichokes. Cabbage. Broccoli. Cauliflower. Green peas. Carrots. Squash. Grains Whole-grain breads. Multigrain cereal. Oats and oatmeal. Brown rice. Barley. Bulgur wheat. [place]. Quinoa. Bran muffins. Popcorn. Rye wafer crackers. Meats and other proteins Navy, kidney, and pinto beans. Soybeans. Split peas. Lentils. Nuts and seeds. Dairy Fiber-fortified yogurt. Beverages Fiber-fortified soy milk. Fiber-fortified orange juice. Other foods Fiber bars. The items listed above may not be a complete list of recommended foods and beverages. Contact a dietitian for more options. What foods are not recommended? Fruits Fruit juice. Cooked, strained fruit. Vegetables Fried potatoes. Canned vegetables. Well-cooked vegetables. Grains White bread. Pasta made with refined flour. White rice. Meats and other proteins Fatty cuts of meat. Fried chicken or fried fish. Dairy Milk. Yogurt. Cream cheese. Sour cream. Fats and oils Butters. Beverages Soft drinks. Other foods Cakes and pastries. The items listed above may not be a complete list of foods and beverages to avoid. Contact a dietitian for more information. Summary  Fiber is a type of carbohydrate. It is found in fruits, vegetables, whole grains, and beans.  There are many health benefits of eating a high-fiber diet, such as preventing constipation, lowering  blood cholesterol, helping with weight loss, and reducing your risk of heart disease, diabetes, and certain cancers.  Gradually increase your intake of fiber. Increasing too fast can result in cramping, bloating, and gas. Drink plenty of water while you increase your fiber.  The best sources of fiber include whole fruits and vegetables, whole grains, nuts, seeds, and beans. This information is not intended to replace advice given to you by your health care provider. Make sure you discuss any questions you have with your health care provider. Document Revised: 09/17/2017 Document Reviewed: 09/17/2017 Elsevier Patient Education  2020 [employer].

## 2020-11-15 ENCOUNTER — Other Ambulatory Visit: Payer: Self-pay | Admitting: Nurse Practitioner

## 2020-11-15 DIAGNOSIS — Z1231 Encounter for screening mammogram for malignant neoplasm of breast: Secondary | ICD-10-CM

## 2020-12-20 ENCOUNTER — Encounter: Payer: Self-pay | Admitting: Nurse Practitioner

## 2020-12-20 ENCOUNTER — Ambulatory Visit: Payer: PRIVATE HEALTH INSURANCE | Admitting: Nurse Practitioner

## 2020-12-20 ENCOUNTER — Other Ambulatory Visit: Payer: Self-pay

## 2020-12-20 ENCOUNTER — Ambulatory Visit (INDEPENDENT_AMBULATORY_CARE_PROVIDER_SITE_OTHER): Payer: PRIVATE HEALTH INSURANCE

## 2020-12-20 VITALS — BP 138/90 | HR 82 | Temp 97.3°F | Ht 66.0 in | Wt 215.6 lb

## 2020-12-20 DIAGNOSIS — K5900 Constipation, unspecified: Secondary | ICD-10-CM

## 2020-12-20 DIAGNOSIS — F332 Major depressive disorder, recurrent severe without psychotic features: Secondary | ICD-10-CM | POA: Diagnosis not present

## 2020-12-20 DIAGNOSIS — R110 Nausea: Secondary | ICD-10-CM

## 2020-12-20 DIAGNOSIS — R1032 Left lower quadrant pain: Secondary | ICD-10-CM

## 2020-12-20 DIAGNOSIS — G4481 Hypnic headache: Secondary | ICD-10-CM

## 2020-12-20 DIAGNOSIS — R8290 Unspecified abnormal findings in urine: Secondary | ICD-10-CM

## 2020-12-20 NOTE — Progress Notes (Signed)
Subjective:  Patient ID: Rebecca [name], female    DOB: 08-Jan-1971  Age: 50 y.o. MRN: [id number]  CC: Follow-up (6 month f/u on depression and headaches. )   Constipation This is a chronic problem. The current episode started more than 1 year ago. The problem has been waxing and waning since onset. Her stool frequency is 2 to 3 times per week. The stool is described as firm and pellet like. The patient is not on a high fiber diet. She does not exercise regularly. There has not been adequate water intake. Associated symptoms include abdominal pain, anorexia, diarrhea and nausea. Pertinent negatives include no back pain, bloating, difficulty urinating, fecal incontinence, fever, flatus, hematochezia, hemorrhoids, melena, rectal pain, vomiting or weight loss. She has tried stool softeners and manual disimpaction for the symptoms. Her past medical history is significant for irritable bowel syndrome.   Depression, major, recurrent, in partial remission ([place]) Does not take cymbalta daily due to fear of possible side effects. Persistent increased anxiety and labile mood. Did not schedule appt with therapist. Does not want to change medication.  Advised to take medication daily and schedule an appt with therapist. F/up in 10months  Hypnic headache Did not take topamax due to fear of side effects Did not schedule appt with neurology because headaches are less severe. Has not needed imitrex either  GERD (gastroesophageal reflux disease) Associated with nausea. No improvement with pepcid or tums or prilosec. Unknown trigger. Hx of cholelithiasis per [place] completed 2015. No significant weight loss noted Previous TSH and CMP: normal Check H. Pylori today, if normal ref to GI  Wt Readings from Last 3 Encounters:  12/20/20 215 lb 9.6 oz (97.8 kg)  10/19/20 215 lb 3.2 oz (97.6 kg)  05/27/20 220 lb 6.4 oz (100 kg)   BP Readings from Last 3 Encounters:  12/20/20 138/90  10/19/20 (!) 118/92   05/27/20 118/82   Reviewed past Medical, Social and Family history today.  Outpatient Medications Prior to Visit  Medication Sig Dispense Refill  . DULoxetine (CYMBALTA) 60 MG capsule Take 1 capsule (60 mg total) by mouth daily. 90 capsule 3  . Multiple Vitamin (MULTIVITAMIN WITH MINERALS) TABS tablet Take 1 tablet by mouth daily.    . SUMAtriptan (IMITREX) 25 MG tablet Take 1 tablet (25 mg total) by mouth every 2 (two) hours as needed for migraine. Do not take more than 100mg  in 24hrs 10 tablet 0  . nicotine (NICODERM CQ) 14 mg/24hr patch Place 1 patch (14 mg total) onto the skin daily. (Patient not taking: Reported on 12/20/2020) 28 patch 0   No facility-administered medications prior to visit.    ROS See HPI  Objective:  BP 138/90 (BP Location: Left Arm, Patient Position: Sitting, Cuff Size: Large)   Pulse 82   Temp (!) 97.3 F (36.3 C) (Temporal)   Ht 5\' 6"  (1.676 m)   Wt 215 lb 9.6 oz (97.8 kg)   LMP 08/13/2013 Comment: negative urine pregnancy test 12-2013  SpO2 98%   BMI 34.80 kg/m   Physical Exam Constitutional:      Appearance: She is obese.  Cardiovascular:     Rate and Rhythm: Normal rate and regular rhythm.     Pulses: Normal pulses.     Heart sounds: Normal heart sounds.  Pulmonary:     Effort: Pulmonary effort is normal.     Breath sounds: Normal breath sounds.  Abdominal:     General: Bowel sounds are normal. There is no distension.  Palpations: Abdomen is soft.     Tenderness: There is abdominal tenderness in the epigastric area, left upper quadrant and left lower quadrant. There is no right CVA tenderness, left CVA tenderness or guarding.  Neurological:     Mental Status: She is alert and oriented to person, place, and time.  Psychiatric:        Attention and Perception: Attention normal.        Mood and Affect: Mood is anxious and depressed. Affect is labile.        Speech: Speech normal.        Behavior: Behavior is cooperative.        Thought  Content: Thought content does not include homicidal or suicidal ideation.        Cognition and Memory: Cognition normal.    Assessment & [name]:  This visit occurred during the SARS-CoV-2 public health emergency.  Safety protocols were in place, including screening questions prior to the visit, additional usage of staff PPE, and extensive cleaning of exam room while observing appropriate contact time as indicated for disinfecting solutions.   Rebecca [name] was seen today for follow-up.  Diagnoses and all orders for this visit:  Severe episode of recurrent major depressive disorder, without psychotic features (HCC)  Nausea -     H. pylori breath test -     Comprehensive metabolic panel -     Ambulatory referral to Gastroenterology -     DG Abd 1 View -     Urinalysis w microscopic + reflex cultur  Constipation, unspecified constipation type -     Comprehensive metabolic panel -     Ambulatory referral to Gastroenterology -     DG Abd 1 View  Left lower quadrant abdominal pain -     DG Abd 1 View -     Urinalysis w microscopic + reflex cultur  Hypnic headache    Problem List Items Addressed This Visit      Digestive   GERD (gastroesophageal reflux disease)    Associated with nausea. No improvement with pepcid or tums or prilosec. Unknown trigger. Hx of cholelithiasis per [place] completed 2015. No significant weight loss noted Previous TSH and CMP: normal Check H. Pylori today, if normal ref to GI        Other   Constipation   Relevant Orders   Comprehensive metabolic panel   Ambulatory referral to Gastroenterology   DG Abd 1 View   Depression, major, recurrent, in partial remission ([place]) - Primary    Does not take cymbalta daily due to fear of possible side effects. Persistent increased anxiety and labile mood. Did not schedule appt with therapist. Does not want to change medication.  Advised to take medication daily and schedule an appt with therapist. F/up in 8months       Hypnic headache    Did not take topamax due to fear of side effects Did not schedule appt with neurology because headaches are less severe. Has not needed imitrex either       Other Visit Diagnoses    Left lower quadrant abdominal pain       Relevant Orders   DG Abd 1 View   Urinalysis w microscopic + reflex cultur      Follow-up: Return in about 4 weeks (around 01/17/2021) for depression and constipation.  Wilfred Lacy, NP

## 2020-12-20 NOTE — Assessment & Plan Note (Addendum)
Associated with nausea. No improvement with pepcid or tums or prilosec or dietary changes. No ETOH intake Unknown trigger. Hx of cholelithiasis per [place] completed 2015. No significant weight loss noted Previous TSH and CMP: normal Check H. Pylori today, if normal ref to GI

## 2020-12-20 NOTE — Assessment & Plan Note (Signed)
Did not take topamax due to fear of side effects Did not schedule appt with neurology because headaches are less severe. Has not needed imitrex either

## 2020-12-20 NOTE — Assessment & Plan Note (Signed)
Does not take cymbalta daily due to fear of possible side effects. Persistent increased anxiety and labile mood. Did not schedule appt with therapist. Does not want to change medication.  Advised to take medication daily and schedule an appt with therapist. F/up in 24months

## 2020-12-20 NOTE — Patient Instructions (Addendum)
Take cymbalta daily. Schedule appt with therapist.  Go to lab for blood draw, ABD x-ray and urine collection.   Constipation, Adult Constipation is when a person has fewer than three bowel movements in a week, has difficulty having a bowel movement, or has stools (feces) that are dry, hard, or larger than normal. Constipation may be caused by an underlying condition. It may become worse with age if a person takes certain medicines and does not take in enough fluids. Follow these instructions at home: Eating and drinking  Eat foods that have a lot of fiber, such as beans, whole grains, and fresh fruits and vegetables.  Limit foods that are low in fiber and high in fat and processed sugars, such as fried or sweet foods. These include french fries, hamburgers, cookies, candies, and soda.  Drink enough fluid to keep your urine pale yellow.   General instructions  Exercise regularly or as told by your health care provider. Try to do 150 minutes of moderate exercise each week.  Use the bathroom when you have the urge to go. Do not hold it in.  Take over-the-counter and prescription medicines only as told by your health care provider. This includes any fiber supplements.  During bowel movements: ? Practice deep breathing while relaxing the lower abdomen. ? Practice pelvic floor relaxation.  Watch your condition for any changes. Let your health care provider know about them.  Keep all follow-up visits as told by your health care provider. This is important. Contact a health care provider if:  You have pain that gets worse.  You have a fever.  You do not have a bowel movement after 4 days.  You vomit.  You are not hungry or you lose weight.  You are bleeding from the opening between the buttocks (anus).  You have thin, pencil-like stools. Get help right away if:  You have a fever and your symptoms suddenly get worse.  You leak stool or have blood in your stool.  Your  abdomen is bloated.  You have severe pain in your abdomen.  You feel dizzy or you faint. Summary  Constipation is when a person has fewer than three bowel movements in a week, has difficulty having a bowel movement, or has stools (feces) that are dry, hard, or larger than normal.  Eat foods that have a lot of fiber, such as beans, whole grains, and fresh fruits and vegetables.  Drink enough fluid to keep your urine pale yellow.  Take over-the-counter and prescription medicines only as told by your health care provider. This includes any fiber supplements. This information is not intended to replace advice given to you by your health care provider. Make sure you discuss any questions you have with your health care provider. Document Revised: 10/01/2019 Document Reviewed: 10/01/2019 Elsevier Patient Education  [place].

## 2020-12-20 NOTE — Assessment & Plan Note (Signed)
>>  ASSESSMENT AND PLAN FOR HYPNIC HEADACHE WRITTEN ON 12/20/2020  4:17 PM BY Jeorgia Helming LUM, NP  Did not take topamax  due to fear of side effects Did not schedule appt with neurology because headaches are less severe. Has not needed imitrex  either

## 2020-12-21 LAB — COMPREHENSIVE METABOLIC PANEL
ALT: 18 U/L (ref 0–35)
AST: 13 U/L (ref 0–37)
Albumin: 4.4 g/dL (ref 3.5–5.2)
Alkaline Phosphatase: 82 U/L (ref 39–117)
BUN: 19 mg/dL (ref 6–23)
CO2: 30 mEq/L (ref 19–32)
Calcium: 9.6 mg/dL (ref 8.4–10.5)
Chloride: 105 mEq/L (ref 96–112)
Creatinine, Ser: 0.81 mg/dL (ref 0.40–1.20)
GFR: 83.85 mL/min (ref >60.00)
Glucose, Bld: 94 mg/dL (ref 70–99)
Potassium: 4.1 mEq/L (ref 3.5–5.1)
Sodium: 139 mEq/L (ref 135–145)
Total Bilirubin: 0.4 mg/dL (ref 0.2–1.2)
Total Protein: 6.9 g/dL (ref 6.0–8.3)

## 2020-12-21 LAB — H. PYLORI BREATH TEST: H. pylori Breath Test: NOT DETECTED

## 2020-12-21 LAB — URINALYSIS W MICROSCOPIC + REFLEX CULTURE
Bacteria, UA: NONE SEEN /HPF
Bilirubin Urine: NEGATIVE
Glucose, UA: NEGATIVE
Hgb urine dipstick: NEGATIVE
Hyaline Cast: NONE SEEN /LPF
Ketones, ur: NEGATIVE
Leukocyte Esterase: NEGATIVE
Nitrites, Initial: NEGATIVE
Protein, ur: NEGATIVE
RBC / HPF: NONE SEEN /HPF (ref 0–2)
Specific Gravity, Urine: 1.025 (ref 1.001–1.03)
WBC, UA: NONE SEEN /HPF (ref 0–5)
pH: 6 (ref 5.0–8.0)

## 2020-12-21 LAB — NO CULTURE INDICATED

## 2020-12-27 ENCOUNTER — Ambulatory Visit: Payer: PRIVATE HEALTH INSURANCE

## 2021-01-03 ENCOUNTER — Other Ambulatory Visit: Payer: Self-pay

## 2021-01-03 ENCOUNTER — Ambulatory Visit (AMBULATORY_SURGERY_CENTER): Payer: PRIVATE HEALTH INSURANCE | Admitting: *Deleted

## 2021-01-03 VITALS — Ht 66.0 in | Wt 217.0 lb

## 2021-01-03 DIAGNOSIS — Z86010 Personal history of colonic polyps: Secondary | ICD-10-CM

## 2021-01-03 NOTE — Progress Notes (Signed)
No egg or soy allergy known to patient  No issues with past sedation with any surgeries or procedures No intubation problems in the past  No FH of Malignant Hyperthermia No diet pills per patient No home 02 use per patient  No blood thinners per patient   Pt states  issues with constipation - daily issues - some hard , some soft loose- no meds - will do a 2 day Suprep - pt agrees with the need for the extra prep  Last colon Movi prep good-  Pt has a Suprep at home from last PV and its not out dated   No A fib or A flutter  EMMI video to pt or via [place] 19 guidelines implemented in PV today with Pt and RN  Pt is fully vaccinated  for Covid    Due to the COVID-19 pandemic we are asking patients to follow certain guidelines.  Pt aware of COVID protocols and [facility] guidelines   Pt verified name, DOB, address and insurance during PV today. Pt mailed instruction packet to included paper to complete and mail back to [facility] with addressed and stamped envelope, Emmi video, copy of consent form to read and not return, and instructions.  PV completed over the phone.  Pt encouraged to call with questions or issues  My Chart instructions to pt as well

## 2021-01-12 ENCOUNTER — Encounter: Payer: Self-pay | Admitting: Internal Medicine

## 2021-01-18 ENCOUNTER — Ambulatory Visit (AMBULATORY_SURGERY_CENTER): Payer: PRIVATE HEALTH INSURANCE | Admitting: Internal Medicine

## 2021-01-18 ENCOUNTER — Encounter: Payer: Self-pay | Admitting: Internal Medicine

## 2021-01-18 ENCOUNTER — Other Ambulatory Visit: Payer: Self-pay

## 2021-01-18 VITALS — BP 118/86 | HR 77 | Temp 97.3°F | Resp 14 | Ht 66.0 in | Wt 217.0 lb

## 2021-01-18 DIAGNOSIS — D122 Benign neoplasm of ascending colon: Secondary | ICD-10-CM

## 2021-01-18 DIAGNOSIS — D123 Benign neoplasm of transverse colon: Secondary | ICD-10-CM | POA: Diagnosis not present

## 2021-01-18 DIAGNOSIS — Z86010 Personal history of colonic polyps: Secondary | ICD-10-CM

## 2021-01-18 MED ORDER — SODIUM CHLORIDE 0.9 % IV SOLN
500.0000 mL | Freq: Once | INTRAVENOUS | Status: DC
Start: 2021-01-18 — End: 2022-02-08

## 2021-01-18 NOTE — Progress Notes (Signed)
To PACU, VSS. Report to [email or web address] 

## 2021-01-18 NOTE — Discharge Instructions (Signed)
Resume previous medications. Handouts on findings given to patient. Await pathology for final recommendations.  YOU HAD AN ENDOSCOPIC PROCEDURE TODAY AT THE [facility]:   Refer to the procedure report that was given to you for any specific questions about what was found during the examination.  If the procedure report does not answer your questions, please call your gastroenterologist to clarify.  If you requested that your care partner not be given the details of your procedure findings, then the procedure report has been included in a sealed envelope for you to review at your convenience later.  YOU SHOULD EXPECT: Some feelings of bloating in the abdomen. Passage of more gas than usual.  Walking can help get rid of the air that was put into your GI tract during the procedure and reduce the bloating. If you had a lower endoscopy (such as a colonoscopy or flexible sigmoidoscopy) you may notice spotting of blood in your stool or on the toilet paper. If you underwent a bowel prep for your procedure, you may not have a normal bowel movement for a few days.  Please Note:  You might notice some irritation and congestion in your nose or some drainage.  This is from the oxygen used during your procedure.  There is no need for concern and it should clear up in a day or so.  SYMPTOMS TO REPORT IMMEDIATELY:   Following lower endoscopy (colonoscopy or flexible sigmoidoscopy):  Excessive amounts of blood in the stool  Significant tenderness or worsening of abdominal pains  Swelling of the abdomen that is new, acute  Fever of 100F or higher  For urgent or emergent issues, a gastroenterologist can be reached at any hour by calling [phone]. Do not use MyChart messaging for urgent concerns.    DIET:  We do recommend a small meal at first, but then you may proceed to your regular diet.  Drink plenty of fluids but you should avoid alcoholic beverages for 24 hours.  ACTIVITY:  You should  plan to take it easy for the rest of today and you should NOT DRIVE or use heavy machinery until tomorrow (because of the sedation medicines used during the test).    FOLLOW UP: Our staff will call the number listed on your records 48-72 hours following your procedure to check on you and address any questions or concerns that you may have regarding the information given to you following your procedure. If we do not reach you, we will leave a message.  We will attempt to reach you two times.  During this call, we will ask if you have developed any symptoms of COVID 19. If you develop any symptoms (ie: fever, flu-like symptoms, shortness of breath, cough etc.) before then, please call [phone].  If you test positive for Covid 19 in the 2 weeks post procedure, please call and report this information to us.    If any biopsies were taken you will be contacted by phone or by letter within the next 1-3 weeks.  Please call us at [phone] if you have not heard about the biopsies in 3 weeks.    SIGNATURES/CONFIDENTIALITY: You and/or your care partner have signed paperwork which will be entered into your electronic medical record.  These signatures attest to the fact that that the information above on your After Visit Summary has been reviewed and is understood.  Full responsibility of the confidentiality of this discharge information lies with you and/or your care-partner. 

## 2021-01-18 NOTE — Op Note (Signed)
[facility] Patient Name: Rebecca [name] Procedure Date: 01/18/2021 2:04 PM MRN: [id number] Endoscopist: Jerene Bears , MD Age: 50 Referring MD:  Date of Birth: 1971-03-15 Gender: Female Account #: [id number] Procedure:                Colonoscopy Indications:              High risk colon cancer surveillance: Personal                            history of non-advanced adenoma, Last colonoscopy:                            January 2013 Medicines:                Monitored Anesthesia Care Procedure:                Pre-Anesthesia Assessment:                           - Prior to the procedure, a History and Physical                            was performed, and patient medications and                            allergies were reviewed. The patient's tolerance of                            previous anesthesia was also reviewed. The risks                            and benefits of the procedure and the sedation                            options and risks were discussed with the patient.                            All questions were answered, and informed consent                            was obtained. Prior Anticoagulants: The patient has                            taken no previous anticoagulant or antiplatelet                            agents. ASA Grade Assessment: II - A patient with                            mild systemic disease. After reviewing the risks                            and benefits, the patient was deemed in  satisfactory condition to undergo the procedure.                           After obtaining informed consent, the colonoscope                            was passed under direct vision. Throughout the                            procedure, the patient's blood pressure, pulse, and                            oxygen saturations were monitored continuously. The                            Olympus PFC-[id number]) Colonoscope was                             introduced through the anus and advanced to the                            cecum, identified by appendiceal orifice and                            ileocecal valve. The colonoscopy was performed                            without difficulty. The patient tolerated the                            procedure well. The quality of the bowel                            preparation was good. The ileocecal valve,                            appendiceal orifice, and rectum were photographed. Scope In: 2:21:14 PM Scope Out: 2:36:58 PM Scope Withdrawal Time: 0 hours 11 minutes 59 seconds  Total Procedure Duration: 0 hours 15 minutes 44 seconds  Findings:                 The digital rectal exam was normal.                           A 6 mm polyp was found in the ascending colon. The                            polyp was sessile. The polyp was removed with a                            cold snare. Resection and retrieval were complete.                           Two sessile polyps were found in the transverse  colon. The polyps were 4 to 5 mm in size. These                            polyps were removed with a cold snare. Resection                            and retrieval were complete.                           A few small-mouthed diverticula were found in the                            sigmoid colon.                           The retroflexed view of the distal rectum and anal                            verge was normal and showed no anal or rectal                            abnormalities. Complications:            No immediate complications. Estimated Blood Loss:     Estimated blood loss was minimal. Impression:               - One 6 mm polyp in the ascending colon, removed                            with a cold snare. Resected and retrieved.                           - Two 4 to 5 mm polyps in the transverse colon,                            removed with a  cold snare. Resected and retrieved.                           - Diverticulosis in the sigmoid colon.                           - The distal rectum and anal verge are normal on                            retroflexion view. Recommendation:           - Patient has a contact number available for                            emergencies. The signs and symptoms of potential                            delayed complications were discussed with the  patient. Return to normal activities tomorrow.                            Written discharge instructions were provided to the                            patient.                           - Resume previous diet.                           - Continue present medications.                           - Await pathology results.                           - Repeat colonoscopy is recommended for                            surveillance. The colonoscopy date will be                            determined after pathology results from today's                            exam become available for review. Jerene Bears, MD 01/18/2021 2:41:07 PM This report has been signed electronically.

## 2021-01-18 NOTE — Progress Notes (Signed)
Called to room to assist during endoscopic procedure.  Patient ID and intended procedure confirmed with present staff. Received instructions for my participation in the procedure from the performing physician.  

## 2021-01-18 NOTE — Progress Notes (Signed)
VS by CW.  previsit with EM.  Pt states no changes to health hx since previsit

## 2021-01-20 ENCOUNTER — Telehealth: Payer: Self-pay

## 2021-01-20 ENCOUNTER — Telehealth: Payer: Self-pay | Admitting: *Deleted

## 2021-01-20 NOTE — Telephone Encounter (Signed)
Second f/u call attempt.  LVM 

## 2021-01-20 NOTE — Telephone Encounter (Signed)
No answer on follow up call. 

## 2021-01-27 ENCOUNTER — Encounter: Payer: Self-pay | Admitting: Internal Medicine

## 2021-02-21 ENCOUNTER — Ambulatory Visit: Payer: PRIVATE HEALTH INSURANCE | Admitting: Physical Therapy

## 2021-04-18 ENCOUNTER — Ambulatory Visit: Payer: PRIVATE HEALTH INSURANCE | Attending: Obstetrics and Gynecology | Admitting: Physical Therapy

## 2021-07-11 ENCOUNTER — Telehealth: Payer: Self-pay | Admitting: Nurse Practitioner

## 2021-07-11 NOTE — Telephone Encounter (Signed)
Pt has scheduled herself

## 2021-07-11 NOTE — Telephone Encounter (Signed)
Pt has scheduled herself an appointment on 07/21/21 for 15 mins. I do see this is a med refill, should this be a 30 minute app?

## 2021-07-21 ENCOUNTER — Ambulatory Visit: Payer: No Typology Code available for payment source | Admitting: Nurse Practitioner

## 2021-08-23 ENCOUNTER — Telehealth: Payer: Self-pay | Admitting: Nurse Practitioner

## 2021-08-23 NOTE — Telephone Encounter (Signed)
Pt missed her call and would like a call back.

## 2021-08-23 NOTE — Telephone Encounter (Signed)
LVM for patient to return call. 

## 2021-09-04 ENCOUNTER — Other Ambulatory Visit: Payer: Self-pay | Admitting: Nurse Practitioner

## 2021-09-04 DIAGNOSIS — F3341 Major depressive disorder, recurrent, in partial remission: Secondary | ICD-10-CM

## 2021-09-22 ENCOUNTER — Encounter: Payer: Self-pay | Admitting: Nurse Practitioner

## 2021-09-22 ENCOUNTER — Ambulatory Visit: Payer: No Typology Code available for payment source | Admitting: Nurse Practitioner

## 2021-09-22 ENCOUNTER — Other Ambulatory Visit: Payer: Self-pay

## 2021-09-22 VITALS — BP 112/86 | Temp 97.3°F | Ht 67.0 in | Wt 218.4 lb

## 2021-09-22 DIAGNOSIS — Z23 Encounter for immunization: Secondary | ICD-10-CM

## 2021-09-22 DIAGNOSIS — F3342 Major depressive disorder, recurrent, in full remission: Secondary | ICD-10-CM | POA: Diagnosis not present

## 2021-09-22 MED ORDER — DULOXETINE HCL 60 MG PO CPEP
60.0000 mg | ORAL_CAPSULE | Freq: Every day | ORAL | 3 refills | Status: DC
  Filled 2021-11-22: qty 90, 90d supply, fill #0

## 2021-09-22 NOTE — Assessment & Plan Note (Signed)
Stable mood with cymbalta Refill sent

## 2021-09-22 NOTE — Progress Notes (Signed)
Subjective:  Patient ID: Rebecca [name] Plan, female    DOB: [birth year]  Age: 50 y.o. MRN: [id number]  CC: Follow-up (Medication refills needed (Cymbalta)./Flu vaccine given/)  HPI  Depression, major, single episode, complete remission ([place]) Stable mood with cymbalta Refill sent Depression screen [facility] 2/9 09/22/2021 12/20/2020 05/27/2020  Decreased Interest 0 1 3  Down, Depressed, Hopeless 0 1 1  PHQ - 2 Score 0 2 4  Altered sleeping 0 3 3  Tired, decreased energy 0 3 3  Change in appetite 0 2 1  Feeling bad or failure about yourself  0 0 0  Trouble concentrating 0 1 3  Moving slowly or fidgety/restless 0 0 1  Suicidal thoughts 0 0 0  PHQ-9 Score 0 11 15  Difficult doing work/chores - Not difficult at all -    GAD 7 : Generalized Anxiety Score 09/22/2021 12/20/2020 05/27/2020 08/06/2019  Nervous, Anxious, on Edge 0 0 3 0  Control/stop worrying 0 1 3 3   Worry too much - different things 0 3 3 3   Trouble relaxing 0 1 2 3   Restless 0 0 0 0  Easily annoyed or irritable 0 1 3 3   Afraid - awful might happen 0 3 1 3   Total GAD 7 Score 0 9 15 15   Anxiety Difficulty - Not difficult at all - -   Reviewed past Medical, Social and Family history today.  Outpatient Medications Prior to Visit  Medication Sig Dispense Refill   DULoxetine (CYMBALTA) 60 MG capsule Take 1 capsule (60 mg total) by mouth daily. 90 capsule 3   Multiple Vitamin (MULTIVITAMIN WITH MINERALS) TABS tablet Take 1 tablet by mouth daily. (Patient not taking: Reported on 09/22/2021)     SUMAtriptan (IMITREX) 25 MG tablet Take 1 tablet (25 mg total) by mouth every 2 (two) hours as needed for migraine. Do not take more than 100mg  in 24hrs (Patient not taking: Reported on 09/22/2021) 10 tablet 0   Facility-Administered Medications Prior to Visit  Medication Dose Route Frequency Provider Last Rate Last Admin   0.9 %  sodium chloride infusion  500 mL Intravenous Once Pyrtle, Lajuan Lines, MD        ROS See HPI  Objective:  BP 112/86  (BP Location: Left Arm, Patient Position: Sitting, Cuff Size: Large)   Temp (!) 97.3 F (36.3 C) (Temporal)   Ht 5\' 7"  (1.702 m)   Wt 218 lb 6.4 oz (99.1 kg)   LMP 08/13/2013 Comment: negative urine pregnancy test 12-2013  SpO2 98%   BMI 34.21 kg/m   Physical Exam Vitals reviewed.  Cardiovascular:     Rate and Rhythm: Normal rate.     Pulses: Normal pulses.  Pulmonary:     Effort: Pulmonary effort is normal.  Neurological:     Mental Status: She is alert and oriented to person, place, and time.  Psychiatric:        Mood and Affect: Mood normal.        Behavior: Behavior normal.        Thought Content: Thought content normal.   Assessment & Plan:  This visit occurred during the SARS-CoV-2 public health emergency.  Safety protocols were in place, including screening questions prior to the visit, additional usage of staff PPE, and extensive cleaning of exam room while observing appropriate contact time as indicated for disinfecting solutions.   Rebecca [name] was seen today for follow-up.  Diagnoses and all orders for this visit:  Recurrent major depressive disorder, in full remission ([place]) -  DULoxetine (CYMBALTA) 60 MG capsule; Take 1 capsule (60 mg total) by mouth daily.  Flu vaccine need -     Flu Vaccine QUAD 6+ mos PF IM (Fluarix Quad PF)  Problem List Items Addressed This Visit       Other   Depression, major, single episode, complete remission ([place]) - Primary    Stable mood with cymbalta Refill sent      Relevant Medications   DULoxetine (CYMBALTA) 60 MG capsule   Other Visit Diagnoses     Flu vaccine need       Relevant Orders   Flu Vaccine QUAD 6+ mos PF IM (Fluarix Quad PF) (Completed)       Follow-up: Return in about 3 months (around 12/23/2021) for CPE (fasting).  Rebecca Lacy, NP

## 2021-10-27 NOTE — Telephone Encounter (Signed)
rx refilled 09/22/21

## 2021-10-31 ENCOUNTER — Other Ambulatory Visit: Payer: Self-pay | Admitting: Nurse Practitioner

## 2021-10-31 ENCOUNTER — Ambulatory Visit (INDEPENDENT_AMBULATORY_CARE_PROVIDER_SITE_OTHER): Payer: No Typology Code available for payment source

## 2021-10-31 DIAGNOSIS — M25572 Pain in left ankle and joints of left foot: Secondary | ICD-10-CM

## 2021-11-01 DIAGNOSIS — S8265XA Nondisplaced fracture of lateral malleolus of left fibula, initial encounter for closed fracture: Secondary | ICD-10-CM | POA: Diagnosis not present

## 2021-11-01 DIAGNOSIS — S93492A Sprain of other ligament of left ankle, initial encounter: Secondary | ICD-10-CM | POA: Diagnosis not present

## 2021-11-22 ENCOUNTER — Other Ambulatory Visit (HOSPITAL_COMMUNITY): Payer: Self-pay

## 2021-12-08 NOTE — Therapy (Addendum)
OUTPATIENT PHYSICAL THERAPY LOWER EXTREMITY EVALUATION   Patient Name: Rebecca [name] MRN: [id number] DOB:1971-03-15, 51 y.o., female Today's Date: 12/15/2021   PT End of Session - 12/15/21 1656     Visit Number 1    Number of Visits 16    Date for PT Re-Evaluation 02/09/22    Authorization Type WC    PT Start Time 0415    PT Stop Time 0453    PT Time Calculation (min) 38 min             Past Medical History:  Diagnosis Date   Anemia    Colon polyp 12/25/2011   Tubular adenomatous   Depression    following death of mother   Duodenitis    DVT (deep vein thrombosis) in pregnancy    Esophagitis    Fatigue    Gallstones    GERD (gastroesophageal reflux disease)    Hemorrhagic ovarian cyst 05/22/2012   04/2012    Hiatal hernia    Infection    UTI   Obesity    PUD (peptic ulcer disease)    Sacroiliac pain    Vaginal Pap smear, abnormal    colpo, results ok   Vitamin D deficiency    Past Surgical History:  Procedure Laterality Date   BREAST EXCISIONAL BIOPSY Left    benign   COLONOSCOPY     COLPOSCOPY  2012   NEG   DILATATION & CURETTAGE/HYSTEROSCOPY WITH MYOSURE N/A 01/17/2017   Procedure: [place];  Surgeon: Servando Salina, MD;  Location: [facility];  Service: Gynecology;  Laterality: N/A;   POLYPECTOMY     TUBAL LIGATION     Patient Active Problem List   Diagnosis Date Noted   Hypnic headache 08/07/2019   Loud snoring 08/07/2019   Chronic pain of left knee 07/03/2018   Onychomycosis 05/17/2018   Tinea pedis of both feet 05/17/2018   Prediabetes 11/16/2014   Obesity 11/16/2014   Essential hypertension 11/16/2014   Mixed hyperlipidemia 10/06/2013   Cholelithiasis 05/22/2012   Benign colonic polyp 01/15/2012   Constipation 12/12/2011   Vitamin D deficiency 12/12/2011   Depression, major, single episode, complete remission ([place]) 12/12/2011   GERD (gastroesophageal reflux disease) 01/24/2007    PCP: Flossie Buffy, NP  REFERRING PROVIDER: Armond Hang, MD  REFERRING DIAG: Referral diagnosis: Sprain of other ligament of left ankle, initial encounter [R11.657X]  THERAPY DIAG:  Pain in left ankle and joints of left foot  Unsteadiness on feet  Other abnormalities of gait and mobility  Localized edema  ONSET DATE: 12/5  SUBJECTIVE:  Rebecca [name] is a 52 y.o. female who presents to clinic with chief complaint of L ankle pain.  MOI/History of condition:  Pt reports that she rolled her ankle on 12/5 while leaving a pts room (inversion).  Imaging shows possible small avulsion fracture on lateral malleolus.  Her pain has been improving.  She was in a CAM boot until about 3 days ago and is now wearing an ASO.    Red flags:  denies   Pertinent past history:  none  Pain:  Are you having pain? 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 No, Number of falls: 0  LIVING ENVIRONMENT: Lives with: lives with their family Stairs: No;   PLOF: Independent  DIAGNOSTIC FINDINGS:   IMPRESSION: Possible tiny cortical avulsion at the tip of the lateral fibular malleolus.   OBJECTIVE:    GENERAL OBSERVATION:   Antalgic gait, reduced time in stance  on L, reduced heel and toe off L  SENSATION:  Light touch: Appears intact  PALPATION: TTP L lateral malleolus  LE MMT:  MMT Right 12/15/2021 Left 12/15/2021  Hip flexion (L2, L3)    Knee extension (L3)    Knee flexion    Hip abduction    Hip extension    Hip external rotation    Hip internal rotation    Hip adduction    Ankle dorsiflexion (L4) n 3+  Ankle plantarflexion (S1) Unable to toe walk Unable to toe walk  Ankle inversion n 3+  Ankle eversion n 3+  Great Toe ext (L5)     (Blank rows = not tested, score listed is out of 5 possible points.  N = WNL, D = diminished, C = clear for gross weakness with myotome testing, * = concordant pain with testing)  LE ROM:  ROM Right 12/15/2021 Left 12/15/2021  Hip flexion    Hip extension    Hip abduction    Hip adduction    Hip internal rotation    Hip external rotation    Knee flexion    Knee extension    Ankle dorsiflexion 5 degrees OC Lacking 5 OC  Ankle plantarflexion N N  Ankle inversion N N  Ankle eversion N N    (Blank rows = not tested)  FUNCTIONAL TESTS:  Progressive balance screen:  Feet together: 10'' Semi Tandem: R in rear 10'', L in rear 5'' Tandem: R in rear unable, L in rear unable  GAIT: Comments: see general observation  PATIENT SURVEYS:  FOTO 58 -> 68    TODAY'S TREATMENT: Creating, reviewing, and completing below HEP   PATIENT EDUCATION:  POC, diagnosis, prognosis, HEP, and outcome measures.  Pt educated via explanation, demonstration, and handout (HEP).  Pt confirms understanding verbally.    HOME EXERCISE PROGRAM: Access Code: [email or web address] URL: [email or web address] Date: 12/15/2021 Prepared by: Shearon Balo  Exercises Towel Scrunches - 1 x daily - 7 x weekly - 3 sets - 10 reps Long Sitting Calf Stretch with Strap - 3 x daily - 7 x  weekly - 1 sets - 3 reps - 45 hold Seated Figure 4 Ankle Inversion with Resistance - 1 x daily - 7 x weekly - 3 sets - 10 reps Long  Sitting Ankle Plantar Flexion with Resistance - 1 x daily - 7 x weekly - 3 sets - 10 reps Long Sitting Ankle Eversion with Resistance - 1 x daily - 7 x weekly - 3 sets - 10 reps   ASSESSMENT:  CLINICAL IMPRESSION: Rebecca [name] is a 51 y.o. female who presents to clinic with signs and sxs consistent with L inversion ankle sprain on 12/5.  Patient presents with pain and impairments/deficits in: ankle ROM (DF), R ankle strength, balance, gait.  Activity limitations include: walking and standing.  Participation limitations include: working without pain.  Patient will benefit from skilled therapy to address pain and the listed deficits in order to achieve functional goals, enable safety and independence in completion of daily tasks, and return to PLOF.   REHAB POTENTIAL: Good  CLINICAL DECISION MAKING: Stable/uncomplicated  EVALUATION COMPLEXITY: Low   GOALS:  SHORT TERM GOALS:  STG Name Target Date Goal status  1 Emmery will be >75% HEP compliant to improve carryover between sessions and facilitate independent management of condition  Baseline: No HEP 01/05/2022 INITIAL   LONG TERM GOALS:   LTG Name Target Date Goal status  1 [place] will improve FOTO score from 58 (baseline) to 68 as a proxy for functional improvement 02/09/2022 INITIAL  2 Evelina will improve left open chain ankle DF to 5 degrees  Baseline: lacking 5 degrees 02/09/2022 INITIAL  3 D[place]en will report >/= 50% decrease in pain from evaluation   Baseline: 4/10 max pain 02/09/2022 INITIAL  4 Lovena will be able to stand for >30'' in tandem stance, to show a significant improvement in balance in order to reduce fall risk   Baseline: unable 02/09/2022 INITIAL   PLAN: PT FREQUENCY: 1-2x/week  PT DURATION: 8 weeks (Ending 02/09/2022)  PLANNED INTERVENTIONS: Therapeutic exercises, Therapeutic activity, Neuro Muscular re-education, Gait training, Patient/Family education, Joint mobilization, Dry Needling, Electrical stimulation,  Spinal mobilization and/or manipulation, Moist heat, Taping, Vasopneumatic device, Ionotophoresis 4mg /ml Dexamethasone, and Manual therapy  PLAN FOR NEXT SESSION: progressive balance/foot/ankle/hip/gait   Shearon Balo PT, DPT 12/15/2021, 4:57 PM

## 2021-12-15 ENCOUNTER — Ambulatory Visit: Payer: PRIVATE HEALTH INSURANCE | Attending: Orthopaedic Surgery | Admitting: Physical Therapy

## 2021-12-15 ENCOUNTER — Encounter: Payer: Self-pay | Admitting: Physical Therapy

## 2021-12-15 ENCOUNTER — Other Ambulatory Visit: Payer: Self-pay

## 2021-12-15 DIAGNOSIS — R2681 Unsteadiness on feet: Secondary | ICD-10-CM | POA: Diagnosis present

## 2021-12-15 DIAGNOSIS — R2689 Other abnormalities of gait and mobility: Secondary | ICD-10-CM | POA: Insufficient documentation

## 2021-12-15 DIAGNOSIS — M25572 Pain in left ankle and joints of left foot: Secondary | ICD-10-CM | POA: Insufficient documentation

## 2021-12-15 DIAGNOSIS — R600 Localized edema: Secondary | ICD-10-CM | POA: Insufficient documentation

## 2021-12-16 ENCOUNTER — Other Ambulatory Visit: Payer: Self-pay | Admitting: Nurse Practitioner

## 2021-12-16 DIAGNOSIS — Z1231 Encounter for screening mammogram for malignant neoplasm of breast: Secondary | ICD-10-CM

## 2021-12-17 NOTE — Therapy (Signed)
OUTPATIENT PHYSICAL THERAPY TREATMENT NOTE   Patient Name: Rebecca [name] MRN: [id number] DOB:20-Mar-1971, 51 y.o., female Today's Date: 12/20/2021  PCP: Flossie Buffy, NP REFERRING PROVIDER: Flossie Buffy, NP   PT End of Session - 12/20/21 1554     Visit Number 2    Number of Visits 16    Date for PT Re-Evaluation 02/09/22    Authorization Type WC    PT Start Time 0331    PT Stop Time 0413    PT Time Calculation (min) 42 min    Activity Tolerance Patient tolerated treatment well;No increased pain    Behavior During Therapy [facility] for tasks assessed/performed             Past Medical History:  Diagnosis Date   Anemia    Colon polyp 12/25/2011   Tubular adenomatous   Depression    following death of mother   Duodenitis    DVT (deep vein thrombosis) in pregnancy    Esophagitis    Fatigue    Gallstones    GERD (gastroesophageal reflux disease)    Hemorrhagic ovarian cyst 05/22/2012   04/2012    Hiatal hernia    Infection    UTI   Obesity    PUD (peptic ulcer disease)    Sacroiliac pain    Vaginal Pap smear, abnormal    colpo, results ok   Vitamin D deficiency    Past Surgical History:  Procedure Laterality Date   BREAST EXCISIONAL BIOPSY Left    benign   COLONOSCOPY     COLPOSCOPY  2012   NEG   DILATATION & CURETTAGE/HYSTEROSCOPY WITH MYOSURE N/A 01/17/2017   Procedure: [place];  Surgeon: Servando Salina, MD;  Location: [facility];  Service: Gynecology;  Laterality: N/A;   POLYPECTOMY     TUBAL LIGATION     Patient Active Problem List   Diagnosis Date Noted   Hypnic headache 08/07/2019   Loud snoring 08/07/2019   Chronic pain of left knee 07/03/2018   Onychomycosis 05/17/2018   Tinea pedis of both feet 05/17/2018   Prediabetes 11/16/2014   Obesity 11/16/2014   Essential hypertension 11/16/2014   Mixed hyperlipidemia 10/06/2013   Cholelithiasis 05/22/2012   Benign colonic polyp 01/15/2012    Constipation 12/12/2011   Vitamin D deficiency 12/12/2011   Depression, major, single episode, complete remission ([place]) 12/12/2011   GERD (gastroesophageal reflux disease) 01/24/2007    REFERRING DIAG: Referral diagnosis: Sprain of other ligament of left ankle, initial encounter [U93.235T]  THERAPY DIAG:  Pain in left ankle and joints of left foot  Unsteadiness on feet  Other abnormalities of gait and mobility  PERTINENT HISTORY: none; Pt reports that she rolled her ankle on 12/5 while leaving a pts room (inversion).  Imaging shows possible small avulsion fracture on lateral malleolus.  Her pain has been improving.  She was in a CAM boot until about 3 days ago and is now wearing an ASO.  PRECAUTIONS: None  SUBJECTIVE: 3/10 pain at the end of my work shift.I ice at home. Haven't had time to do the exercises.   PAIN:  Are you having pain? Yes NPRS scale: 3/10 Pain location: L lateral ankle Pain orientation: Left  PAIN TYPE: aching Pain description: intermittent  Aggravating factors: rest Relieving factors: movement  OBJECTIVE:              GENERAL OBSERVATION:  Antalgic gait, reduced time in stance on L, reduced heel and toe off L   SENSATION:          Light touch: Appears intact   PALPATION: TTP L lateral malleolus   LE MMT:   MMT Right 12/15/2021 Left 12/15/2021  Hip flexion (L2, L3)      Knee extension (L3)      Knee flexion      Hip abduction      Hip extension      Hip external rotation      Hip internal rotation      Hip adduction      Ankle dorsiflexion (L4) n 3+  Ankle plantarflexion (S1) Unable to toe walk Unable to toe walk  Ankle inversion n 3+  Ankle eversion n 3+  Great Toe ext (L5)        (Blank rows = not tested, score listed is out of 5 possible points.  N = WNL, D = diminished, C = clear for gross weakness with myotome testing, * = concordant pain with testing)   LE ROM:   ROM Right 12/15/2021 Left 12/15/2021  Hip  flexion      Hip extension      Hip abduction      Hip adduction      Hip internal rotation      Hip external rotation      Knee flexion      Knee extension      Ankle dorsiflexion 5 degrees OC Lacking 5 OC  Ankle plantarflexion N N  Ankle inversion N N  Ankle eversion N N     (Blank rows = not tested)   FUNCTIONAL TESTS:  Progressive balance screen:   Feet together: 10'' Semi Tandem: R in rear 10'', L in rear 5'' Tandem: R in rear unable, L in rear unable   GAIT: Comments: see general observation   PATIENT SURVEYS:  FOTO 58 -> 68       TODAY'S TREATMENT:  [facility] Adult PT Treatment:                                                DATE: 12/20/21 Therapeutic Exercise: Towel crunches x 20 L Supination AROM x 20 L Attempted folding towel but pt needed to take off her sock and declined Seated calf raise x 20/toe raise x 20 L RTB L ankle 4 way x 15 L ankle alphabet Standing L with R hamstring curl, R hip ext, R hip abdct x 15 each at counter Standing L hip abdct x 20 at counter Bil toe raise/heel raise x 20 each at counter Squats x 20 Green Theradisc L similar to mini lateral stepup at counter (Trendelenberg exercise) Wooden platform calf stretch 2x30 sec bil Advised pt to ice 10-15 min; told her to wrap ice in a towel Advised her to do HEP every other day and that she may be sore after today's treatment and to ice, skip a day, and then do HEP again in 2 days if sore   1/19 treatment: Creating, reviewing, and completing below HEP     PATIENT EDUCATION:  POC, diagnosis, prognosis, HEP, and outcome measures.  Pt educated via explanation, demonstration, and handout (HEP).  Pt confirms understanding verbally.      HOME EXERCISE PROGRAM: Access Code: [email or web address] URL: [email or web address] Date: 12/15/2021 Prepared by: Peterson Ao  Reinhartsen   Exercises Towel Scrunches - 1 x daily - 7 x weekly - 3 sets - 10 reps Long Sitting Calf Stretch with Strap - 3 x daily -  7 x weekly - 1 sets - 3 reps - 45 hold Seated Figure 4 Ankle Inversion with Resistance - 1 x daily - 7 x weekly - 3 sets - 10 reps Long Sitting Ankle Plantar Flexion with Resistance - 1 x daily - 7 x weekly - 3 sets - 10 reps Long Sitting Ankle Eversion with Resistance - 1 x daily - 7 x weekly - 3 sets - 10 reps     ASSESSMENT:   CLINICAL IMPRESSION: Teeghan is a 51 y.o. female who presents to clinic with signs and sxs consistent with L inversion ankle sprain on 12/5.  Pt reports she has not done HEP but will beginning doing it after today. Pt did not have any pain with review of HEP and progression of therex today. She stated she could tell her ankle was tired after working all day and then coming to therapy. Continue L ankle strengthening/proprioceptive exercises all positions. Side note, she did say she likes to do ankle circles and the clicking feels good in her ankle; PT heard audible clicking. Advised pt to do HEP.      REHAB POTENTIAL: Good   CLINICAL DECISION MAKING: Stable/uncomplicated   EVALUATION COMPLEXITY: Low     GOALS:   SHORT TERM GOALS:   STG Name Target Date Goal status  1 Marlies will be >75% HEP compliant to improve carryover between sessions and facilitate independent management of condition   Baseline: No HEP 01/05/2022 INITIAL    LONG TERM GOALS:    LTG Name Target Date Goal status  1 Aliviyah will improve FOTO score from 58 (baseline) to 68 as a proxy for functional improvement 02/09/2022 INITIAL  2 Lorette will improve left open chain ankle DF to 5 degrees   Baseline: lacking 5 degrees 02/09/2022 INITIAL  3 Keena will report >/= 50% decrease in pain from evaluation    Baseline: 4/10 max pain 02/09/2022 INITIAL  4 Xandra will be able to stand for >30'' in tandem stance, to show a significant improvement in balance in order to reduce fall risk    Baseline: unable 02/09/2022 INITIAL    PLAN: PT FREQUENCY: 1-2x/week   PT DURATION: 8 weeks (Ending 02/09/2022)    PLANNED INTERVENTIONS: Therapeutic exercises, Therapeutic activity, Neuro Muscular re-education, Gait training, Patient/Family education, Joint mobilization, Dry Needling, Electrical stimulation, Spinal mobilization and/or manipulation, Moist heat, Taping, Vasopneumatic device, Ionotophoresis 4mg /ml Dexamethasone, and Manual therapy   PLAN FOR NEXT SESSION: progressive balance/foot/ankle/hip/gait L ankle   America Brown, PT 12/20/2021, 4:13 PM

## 2021-12-20 ENCOUNTER — Other Ambulatory Visit: Payer: Self-pay

## 2021-12-20 ENCOUNTER — Encounter: Payer: Self-pay | Admitting: Rehabilitative and Restorative Service Providers"

## 2021-12-20 ENCOUNTER — Ambulatory Visit
Payer: PRIVATE HEALTH INSURANCE | Attending: Nurse Practitioner | Admitting: Rehabilitative and Restorative Service Providers"

## 2021-12-20 DIAGNOSIS — R2689 Other abnormalities of gait and mobility: Secondary | ICD-10-CM | POA: Insufficient documentation

## 2021-12-20 DIAGNOSIS — R600 Localized edema: Secondary | ICD-10-CM | POA: Diagnosis present

## 2021-12-20 DIAGNOSIS — R2681 Unsteadiness on feet: Secondary | ICD-10-CM | POA: Insufficient documentation

## 2021-12-20 DIAGNOSIS — M25572 Pain in left ankle and joints of left foot: Secondary | ICD-10-CM | POA: Insufficient documentation

## 2021-12-26 ENCOUNTER — Other Ambulatory Visit: Payer: Self-pay

## 2021-12-26 ENCOUNTER — Ambulatory Visit: Payer: PRIVATE HEALTH INSURANCE | Admitting: Physical Therapy

## 2021-12-26 ENCOUNTER — Encounter: Payer: No Typology Code available for payment source | Admitting: Nurse Practitioner

## 2021-12-26 ENCOUNTER — Encounter: Payer: Self-pay | Admitting: Physical Therapy

## 2021-12-26 DIAGNOSIS — R2681 Unsteadiness on feet: Secondary | ICD-10-CM

## 2021-12-26 DIAGNOSIS — M25572 Pain in left ankle and joints of left foot: Secondary | ICD-10-CM

## 2021-12-26 DIAGNOSIS — R2689 Other abnormalities of gait and mobility: Secondary | ICD-10-CM

## 2021-12-26 DIAGNOSIS — R600 Localized edema: Secondary | ICD-10-CM

## 2021-12-26 NOTE — Therapy (Signed)
OUTPATIENT PHYSICAL THERAPY TREATMENT NOTE   Patient Name: Rebecca [name] MRN: [id number] DOB:02/27/1971, 51 y.o., female Today's Date: 12/26/2021  PCP: Rebecca Buffy, NP REFERRING PROVIDER: Flossie Buffy, NP   PT End of Session - 12/26/21 1533     Visit Number 3    Number of Visits 16    Date for PT Re-Evaluation 02/09/22    Authorization Type WC    Authorization - Visit Number 2    Authorization - Number of Visits 6    PT Start Time 1532    PT Stop Time 4098    PT Time Calculation (min) 43 min    Activity Tolerance Patient tolerated treatment well    Behavior During Therapy Rebecca [name] for tasks assessed/performed              Past Medical History:  Diagnosis Date   Anemia    Colon polyp 12/25/2011   Tubular adenomatous   Depression    following death of mother   Duodenitis    DVT (deep vein thrombosis) in pregnancy    Esophagitis    Fatigue    Gallstones    GERD (gastroesophageal reflux disease)    Hemorrhagic ovarian cyst 05/22/2012   04/2012    Hiatal hernia    Infection    UTI   Obesity    PUD (peptic ulcer disease)    Sacroiliac pain    Vaginal Pap smear, abnormal    colpo, results ok   Vitamin D deficiency    Past Surgical History:  Procedure Laterality Date   BREAST EXCISIONAL BIOPSY Left    benign   COLONOSCOPY     COLPOSCOPY  2012   NEG   DILATATION & CURETTAGE/HYSTEROSCOPY WITH MYOSURE N/A 01/17/2017   Procedure: [place];  Surgeon: Servando Salina, MD;  Location: [facility];  Service: Gynecology;  Laterality: N/A;   POLYPECTOMY     TUBAL LIGATION     Patient Active Problem List   Diagnosis Date Noted   Hypnic headache 08/07/2019   Loud snoring 08/07/2019   Chronic pain of left knee 07/03/2018   Onychomycosis 05/17/2018   Tinea pedis of both feet 05/17/2018   Prediabetes 11/16/2014   Obesity 11/16/2014   Essential hypertension 11/16/2014   Mixed hyperlipidemia 10/06/2013    Cholelithiasis 05/22/2012   Benign colonic polyp 01/15/2012   Constipation 12/12/2011   Vitamin D deficiency 12/12/2011   Depression, major, single episode, complete remission ([place]) 12/12/2011   GERD (gastroesophageal reflux disease) 01/24/2007    REFERRING DIAG: Referral diagnosis: Sprain of other ligament of left ankle, initial encounter  THERAPY DIAG:  Pain in left ankle and joints of left foot  Unsteadiness on feet  Other abnormalities of gait and mobility  Localized edema  PERTINENT HISTORY: None  PRECAUTIONS: None   SUBJECTIVE: States her ankle is doing pretty good and she is doing the exercises. She does have a little more pain at the end of the day after work.  PAIN:  Are you having pain? Yes NPRS scale: 3/10 Pain location: Ankle Pain orientation: Left  PAIN TYPE: Chronic Pain description: intermittent, dull ache Aggravating factors: rest Relieving factors: movement   Patient Goals: reduce pain, get back to normal   OBJECTIVE:            GENERAL OBSERVATION: Antalgic gait, reduced time in stance on L, reduced heel and toe off L   LE MMT:   MMT Right 12/15/2021 Left 12/15/2021 Left 12/19/2021  Ankle dorsiflexion (L4) n 3+ 4  Ankle plantarflexion (S1) Unable to toe walk Unable to toe walk 4-  Ankle inversion n 3+ 4-  Ankle eversion n 3+ 4   (score listed is out of 5 possible points.  N = WNL, D = diminished, C = clear for gross weakness with myotome testing, * = concordant pain with testing)   LE ROM:   ROM Right 12/15/2021 Left 12/15/2021 Left 12/26/2021  Ankle dorsiflexion 5 degrees OC Lacking 5 OC 5 deg  Ankle plantarflexion N N   Ankle inversion N N   Ankle eversion N N    FUNCTIONAL TESTS:  Progressive balance screen: Feet together: 10'' Semi Tandem: R in rear 10'', L in rear 5'' Tandem: Left in rear able to maintain for 30 seconds  Patient unable to maintain SLS on left   GAIT: Comments: see general observation   PATIENT SURVEYS:   FOTO 58 -> 68     TODAY'S TREATMENT: 12/26/2021: NuStep L6 x 4 min with LE while taking subjective Longsitting calf stretch 2 x 30 sec 4-way ankle with red 2 x 10 each Seated towel scrunches x 20 Standing calf stretch at wall 2 x 30 sec Standing heel raises 2 x 10 Tandem stance 3 x 30 sec each   12/20/21: Therapeutic Exercise: Towel crunches x 20 L Supination AROM x 20 L Attempted folding towel but pt needed to take off her sock and declined Seated calf raise x 20/toe raise x 20 L RTB L ankle 4 way x 15 L ankle alphabet Standing L with R hamstring curl, R hip ext, R hip abdct x 15 each at counter Standing L hip abdct x 20 at counter Bil toe raise/heel raise x 20 each at counter Squats x 20 Green Theradisc L similar to mini lateral stepup at counter (Trendelenberg exercise) Wooden platform calf stretch 2x30 sec bil Advised pt to ice 10-15 min; told her to wrap ice in a towel Advised her to do HEP every other day and that she may be sore after today's treatment and to ice, skip a day, and then do HEP again in 2 days if sore     PATIENT EDUCATION:  HEP update.  Pt educated via explanation, demonstration, and handout.  Pt confirms understanding verbally.      HOME EXERCISE PROGRAM: Access Code: [email or web address]     ASSESSMENT: CLINICAL IMPRESSION: Patient tolerated therapy well with no adverse effects. She demonstrates improved ankle DF this visit and was able to tolerate balance progression, but was unable to perform SLS on the left. She does continue to exhibit some ankle weakness on the left. Therapy continued to progress her strength and updated HEP with good tolerance. Patient would benefit from continued skilled PT to progress her mobility and strength in order to reduce pain and maximize functional ability.   GOALS: SHORT TERM GOALS:   STG Name Target Date Goal status  1 Rebecca [name] will be >75% HEP compliant to improve carryover between sessions and facilitate independent  management of condition   Baseline: No HEP 01/05/2022 INITIAL    LONG TERM GOALS:    LTG Name Target Date Goal status  1 Rebecca [name] will improve FOTO score from 58 (baseline) to 68 as a proxy for functional improvement 02/09/2022 INITIAL  2 Rebecca [name] will improve left open chain ankle DF to 5 degrees   Baseline: lacking 5 degrees 02/09/2022 INITIAL  3 Rebecca [name] will report >/= 50% decrease in pain from evaluation    Baseline: 4/10  max pain 02/09/2022 INITIAL  4 Azuri will be able to stand for >30'' in tandem stance, to show a significant improvement in balance in order to reduce fall risk    Baseline: unable 02/09/2022 INITIAL     PLAN: PT FREQUENCY: 1-2x/week   PT DURATION: 8 weeks (Ending 02/09/2022)   PLANNED INTERVENTIONS: Therapeutic exercises, Therapeutic activity, Neuro Muscular re-education, Gait training, Patient/Family education, Joint mobilization, Dry Needling, Electrical stimulation, Spinal mobilization and/or manipulation, Moist heat, Taping, Vasopneumatic device, Ionotophoresis 4mg /ml Dexamethasone, and Manual therapy   PLAN FOR NEXT SESSION: progressive balance/foot/ankle/hip/gait L ankle   Hilda Blades, PT, DPT, LAT, ATC 12/26/21  4:15 PM Phone: [phone] Fax: [phone]

## 2021-12-26 NOTE — Patient Instructions (Signed)
Access Code: [email or web address] URL: [email or web address] Date: 12/26/2021 Prepared by: Hilda Blades  Exercises Towel Scrunches - 1 x daily - 7 x weekly - 3 sets - 10 reps Seated Figure 4 Ankle Inversion with Resistance - 1 x daily - 7 x weekly - 3 sets - 10 reps Long Sitting Ankle Plantar Flexion with Resistance - 1 x daily - 7 x weekly - 3 sets - 10 reps Long Sitting Ankle Eversion with Resistance - 1 x daily - 7 x weekly - 3 sets - 10 reps Standing Gastroc Stretch at Counter - 1 x daily - 7 x weekly - 3 reps - 30 seconds hold Standing Heel Raise with Support - 1 x daily - 7 x weekly - 3 sets - 10 reps Tandem Stance with Support - 1 x daily - 7 x weekly - 3 reps - 30 seconds hold

## 2021-12-28 ENCOUNTER — Ambulatory Visit
Admission: RE | Admit: 2021-12-28 | Discharge: 2021-12-28 | Disposition: A | Discharge status: Home / Self Care | Payer: No Typology Code available for payment source | Source: Ambulatory Visit

## 2021-12-28 DIAGNOSIS — Z1231 Encounter for screening mammogram for malignant neoplasm of breast: Secondary | ICD-10-CM

## 2022-01-02 ENCOUNTER — Other Ambulatory Visit: Payer: Self-pay

## 2022-01-02 ENCOUNTER — Ambulatory Visit: Payer: PRIVATE HEALTH INSURANCE | Attending: Orthopaedic Surgery | Admitting: Physical Therapy

## 2022-01-02 ENCOUNTER — Encounter: Payer: Self-pay | Admitting: Physical Therapy

## 2022-01-02 DIAGNOSIS — M25572 Pain in left ankle and joints of left foot: Secondary | ICD-10-CM | POA: Diagnosis present

## 2022-01-02 DIAGNOSIS — R600 Localized edema: Secondary | ICD-10-CM | POA: Diagnosis present

## 2022-01-02 DIAGNOSIS — R2689 Other abnormalities of gait and mobility: Secondary | ICD-10-CM | POA: Diagnosis present

## 2022-01-02 DIAGNOSIS — R2681 Unsteadiness on feet: Secondary | ICD-10-CM | POA: Diagnosis present

## 2022-01-02 NOTE — Therapy (Signed)
OUTPATIENT PHYSICAL THERAPY TREATMENT NOTE   Patient Name: Rebecca [name] MRN: [id number] DOB:1971/02/05, 51 y.o., female Today's Date: 01/02/2022  PCP: Flossie Buffy, NP REFERRING PROVIDER: Armond Hang, MD   PT End of Session - 01/02/22 1536     Visit Number 4    Number of Visits 16    Date for PT Re-Evaluation 02/09/22    Authorization Type WC    Authorization - Visit Number 3    Authorization - Number of Visits 6    PT Start Time 3716    PT Stop Time 9678    PT Time Calculation (min) 45 min    Activity Tolerance Patient tolerated treatment well    Behavior During Therapy [facility] for tasks assessed/performed               Past Medical History:  Diagnosis Date   Anemia    Colon polyp 12/25/2011   Tubular adenomatous   Depression    following death of mother   Duodenitis    DVT (deep vein thrombosis) in pregnancy    Esophagitis    Fatigue    Gallstones    GERD (gastroesophageal reflux disease)    Hemorrhagic ovarian cyst 05/22/2012   04/2012    Hiatal hernia    Infection    UTI   Obesity    PUD (peptic ulcer disease)    Sacroiliac pain    Vaginal Pap smear, abnormal    colpo, results ok   Vitamin D deficiency    Past Surgical History:  Procedure Laterality Date   BREAST EXCISIONAL BIOPSY Left    benign   COLONOSCOPY     COLPOSCOPY  2012   NEG   DILATATION & CURETTAGE/HYSTEROSCOPY WITH MYOSURE N/A 01/17/2017   Procedure: [place];  Surgeon: Servando Salina, MD;  Location: [facility];  Service: Gynecology;  Laterality: N/A;   POLYPECTOMY     TUBAL LIGATION     Patient Active Problem List   Diagnosis Date Noted   Hypnic headache 08/07/2019   Loud snoring 08/07/2019   Chronic pain of left knee 07/03/2018   Onychomycosis 05/17/2018   Tinea pedis of both feet 05/17/2018   Prediabetes 11/16/2014   Obesity 11/16/2014   Essential hypertension 11/16/2014   Mixed hyperlipidemia 10/06/2013    Cholelithiasis 05/22/2012   Benign colonic polyp 01/15/2012   Constipation 12/12/2011   Vitamin D deficiency 12/12/2011   Depression, major, single episode, complete remission ([place]) 12/12/2011   GERD (gastroesophageal reflux disease) 01/24/2007    REFERRING DIAG: Referral diagnosis: Sprain of other ligament of left ankle, initial encounter  THERAPY DIAG:  Pain in left ankle and joints of left foot  Unsteadiness on feet  Other abnormalities of gait and mobility  Localized edema  PERTINENT HISTORY: None  PRECAUTIONS: None   SUBJECTIVE: States her ankle is doing well, she cant tell that something is still going on but not bothering her as much.  PAIN:  Are you having pain? Yes NPRS scale: 2/10 Pain location: Ankle Pain orientation: Left  PAIN TYPE: Chronic Pain description: intermittent, dull ache Aggravating factors: rest Relieving factors: movement   Patient Goals: reduce pain, get back to normal   OBJECTIVE:            GENERAL OBSERVATION: Patient with a grossly normalized gait pattern   LE MMT:   MMT Right 12/15/2021 Left 12/15/2021 Left 12/19/2021  Ankle dorsiflexion (L4) n 3+ 4  Ankle plantarflexion (S1) Unable to toe walk  Unable to toe walk 4-  Ankle inversion n 3+ 4-  Ankle eversion n 3+ 4   (score listed is out of 5 possible points.  N = WNL, D = diminished, C = clear for gross weakness with myotome testing, * = concordant pain with testing)   LE ROM:   ROM Right 12/15/2021 Left 12/15/2021 Left 12/26/2021 Left 01/02/2022  Ankle dorsiflexion 5 degrees OC Lacking 5 OC 5 deg 5 deg  Ankle plantarflexion N N    Ankle inversion N N    Ankle eversion N N     FUNCTIONAL TESTS:  Progressive balance screen: Tandem: able to maintain 30+ seconds on Airex SLS: Right - 23 seconds, Left - <5 seconds   PATIENT SURVEYS:  FOTO 58% functional status     TODAY'S TREATMENT: 01/02/2022: NuStep L6 x 5 min with LE while taking subjective BAPS board seated L3 with  forward/backward and lateral taos x 20 each 4-way ankle with green 2 x 10 each Slant board calf stretch 3 x 30 sec Standing heel raises 2 x 15 Tandem stance on Airex 3 x 30 sec each SLS 2 x 10 sec each   12/26/2021: NuStep L6 x 4 min with LE while taking subjective Longsitting calf stretch 2 x 30 sec 4-way ankle with red 2 x 10 each Seated towel scrunches x 20 Standing calf stretch at wall 2 x 30 sec Standing heel raises 2 x 10 Tandem stance 3 x 30 sec each  12/20/21: Therapeutic Exercise: Towel crunches x 20 L Supination AROM x 20 L Attempted folding towel but pt needed to take off her sock and declined Seated calf raise x 20/toe raise x 20 L RTB L ankle 4 way x 15 L ankle alphabet Standing L with R hamstring curl, R hip ext, R hip abdct x 15 each at counter Standing L hip abdct x 20 at counter Bil toe raise/heel raise x 20 each at counter Squats x 20 Green Theradisc L similar to mini lateral stepup at counter (Trendelenberg exercise) Wooden platform calf stretch 2x30 sec bil Advised pt to ice 10-15 min; told her to wrap ice in a towel Advised her to do HEP every other day and that she may be sore after today's treatment and to ice, skip a day, and then do HEP again in 2 days if sore     PATIENT EDUCATION:  HEP update, FOTO.  Pt educated via explanation, demonstration, and handout.  Pt confirms understanding verbally.      HOME EXERCISE PROGRAM: Access Code: [email or web address]     ASSESSMENT: CLINICAL IMPRESSION: Patient tolerated therapy well with no adverse effects. She is progressing well with PT and demonstrates continued improvement in ankle motion, control and balance, walking ability, and overall function on FOTO. She does continue to exhibit limitation with single leg balance and continues with grossly strength deficit. No changes to HEP this visit. Patient would benefit from continued skilled PT to progress her mobility and strength in order to reduce pain and maximize  functional ability.   GOALS: SHORT TERM GOALS:   STG Name Target Date Goal status  1 Rebecca [name] will be >75% HEP compliant to improve carryover between sessions and facilitate independent management of condition   Baseline: patient compliant with HEP 01/05/2022 ACHIEVED    LONG TERM GOALS:    LTG Name Target Date Goal status  1 Rebecca [name] will improve FOTO score from 58 (baseline) to 68 as a proxy for functional improvement 02/09/2022 ONGOING  2 Rebecca [name]  will improve left open chain ankle DF to 5 degrees   Baseline: patient exhibits 5 deg ankle DF 02/09/2022 ACHIEVED  3 Rebecca [name] will report >/= 50% decrease in pain from evaluation    Baseline: patient reports 2/10 pain 02/09/2022 ONGOING  4 Rebecca [name] will be able to stand for >30'' in tandem stance, to show a significant improvement in balance in order to reduce fall risk    Baseline: able to balance 30+ seconds in tandem 02/09/2022 ACHIEVED     PLAN: PT FREQUENCY: 1-2x/week   PT DURATION: 8 weeks (Ending 02/09/2022)   PLANNED INTERVENTIONS: Therapeutic exercises, Therapeutic activity, Neuro Muscular re-education, Gait training, Patient/Family education, Joint mobilization, Dry Needling, Electrical stimulation, Spinal mobilization and/or manipulation, Moist heat, Taping, Vasopneumatic device, Ionotophoresis 4mg /ml Dexamethasone, and Manual therapy   PLAN FOR NEXT SESSION: progressive balance/foot/ankle/hip/gait L ankle   Hilda Blades, PT, DPT, LAT, ATC 01/02/22  4:17 PM Phone: [phone] Fax: [phone]

## 2022-01-09 ENCOUNTER — Other Ambulatory Visit: Payer: Self-pay

## 2022-01-09 ENCOUNTER — Encounter: Payer: Self-pay | Admitting: Physical Therapy

## 2022-01-09 ENCOUNTER — Ambulatory Visit: Payer: PRIVATE HEALTH INSURANCE | Admitting: Physical Therapy

## 2022-01-09 DIAGNOSIS — M25572 Pain in left ankle and joints of left foot: Secondary | ICD-10-CM | POA: Diagnosis not present

## 2022-01-09 DIAGNOSIS — R600 Localized edema: Secondary | ICD-10-CM

## 2022-01-09 DIAGNOSIS — R2689 Other abnormalities of gait and mobility: Secondary | ICD-10-CM

## 2022-01-09 DIAGNOSIS — R2681 Unsteadiness on feet: Secondary | ICD-10-CM

## 2022-01-09 NOTE — Therapy (Signed)
OUTPATIENT PHYSICAL THERAPY TREATMENT NOTE   Patient Name: Rebecca [name] MRN: [id number] DOB:Jun 29, 1971, 51 y.o., female Today's Date: 01/09/2022  PCP: Flossie Buffy, NP REFERRING PROVIDER: Flossie Buffy, NP   PT End of Session - 01/09/22 1536     Visit Number 5    Number of Visits 16    Date for PT Re-Evaluation 02/09/22    Authorization Type WC    Authorization - Visit Number 4    Authorization - Number of Visits 6    PT Start Time 5883    PT Stop Time 2549    PT Time Calculation (min) 45 min    Activity Tolerance Patient tolerated treatment well    Behavior During Therapy [facility] for tasks assessed/performed                Past Medical History:  Diagnosis Date   Anemia    Colon polyp 12/25/2011   Tubular adenomatous   Depression    following death of mother   Duodenitis    DVT (deep vein thrombosis) in pregnancy    Esophagitis    Fatigue    Gallstones    GERD (gastroesophageal reflux disease)    Hemorrhagic ovarian cyst 05/22/2012   04/2012    Hiatal hernia    Infection    UTI   Obesity    PUD (peptic ulcer disease)    Sacroiliac pain    Vaginal Pap smear, abnormal    colpo, results ok   Vitamin D deficiency    Past Surgical History:  Procedure Laterality Date   BREAST EXCISIONAL BIOPSY Left    benign   COLONOSCOPY     COLPOSCOPY  2012   NEG   DILATATION & CURETTAGE/HYSTEROSCOPY WITH MYOSURE N/A 01/17/2017   Procedure: [place];  Surgeon: Servando Salina, MD;  Location: [facility];  Service: Gynecology;  Laterality: N/A;   POLYPECTOMY     TUBAL LIGATION     Patient Active Problem List   Diagnosis Date Noted   Hypnic headache 08/07/2019   Loud snoring 08/07/2019   Chronic pain of left knee 07/03/2018   Onychomycosis 05/17/2018   Tinea pedis of both feet 05/17/2018   Prediabetes 11/16/2014   Obesity 11/16/2014   Essential hypertension 11/16/2014   Mixed hyperlipidemia 10/06/2013    Cholelithiasis 05/22/2012   Benign colonic polyp 01/15/2012   Constipation 12/12/2011   Vitamin D deficiency 12/12/2011   Depression, major, single episode, complete remission ([place]) 12/12/2011   GERD (gastroesophageal reflux disease) 01/24/2007    REFERRING DIAG: Referral diagnosis: Sprain of other ligament of left ankle, initial encounter  THERAPY DIAG:  Pain in left ankle and joints of left foot  Unsteadiness on feet  Other abnormalities of gait and mobility  Localized edema  PERTINENT HISTORY: None  PRECAUTIONS: None   SUBJECTIVE: States she has had a little more pain the past week, today is not too bad. She doesn't know what it is hurting more this past week, could have been the weather or something.   PAIN:  Are you having pain? Yes NPRS scale: 3/10 Pain location: Ankle Pain orientation: Left  PAIN TYPE: Chronic Pain description: intermittent, dull ache Aggravating factors: rest Relieving factors: movement   Patient Goals: reduce pain, get back to normal   OBJECTIVE:            GENERAL OBSERVATION: Patient with a grossly normalized gait pattern   LE MMT:   MMT Right 12/15/2021 Left 12/15/2021 Left  12/19/2021 Left 01/09/2022  Ankle dorsiflexion (L4) n 3+ 4 4+  Ankle plantarflexion (S1) Unable to toe walk Unable to toe walk 4- 4-  Ankle inversion n 3+ 4- 4  Ankle eversion n 3+ 4 4   (score listed is out of 5 possible points.  N = WNL, D = diminished, C = clear for gross weakness with myotome testing, * = concordant pain with testing)   LE ROM:   ROM Right 12/15/2021 Left 12/15/2021 Left 12/26/2021 Left 01/02/2022 Left 01/09/2022  Ankle dorsiflexion 5 degrees OC Lacking 5 OC 5 deg 5 deg 5 deg  Ankle plantarflexion N N     Ankle inversion N N     Ankle eversion N N      FUNCTIONAL TESTS:  Progressive balance screen: Tandem: able to maintain 30+ seconds on Airex SLS: Right - 23 seconds, Left - <5 seconds   PATIENT SURVEYS:  FOTO 59% functional status -  assessed 01/02/2022     TODAY'S TREATMENT: 01/09/2022: NuStep L6 x 5 min with LE while taking subjective Slant board calf stretch 3 x 30 seconds 4-way ankle blue 2 x 10 (inversion perform in seated figure-4) Heel raises on edge of Airex 2 x 15 Rocker board forward/backward taps 2 x 20 with occasional fingertip assist for balance SLS 3 x 10 sec each   01/02/2022: NuStep L6 x 5 min with LE while taking subjective BAPS board seated L3 with forward/backward and lateral taps x 20 each 4-way ankle with green 2 x 10 each Slant board calf stretch 3 x 30 sec Standing heel raises 2 x 15 Tandem stance on Airex 3 x 30 sec each SLS 2 x 10 sec each  12/26/2021: NuStep L6 x 4 min with LE while taking subjective Longsitting calf stretch 2 x 30 sec 4-way ankle with red 2 x 10 each Seated towel scrunches x 20 Standing calf stretch at wall 2 x 30 sec Standing heel raises 2 x 10 Tandem stance 3 x 30 sec each    PATIENT EDUCATION:  HEP update.  Pt educated via explanation, demonstration, and handout.  Pt confirms understanding verbally.      HOME EXERCISE PROGRAM: Access Code: [email or web address]     ASSESSMENT: CLINICAL IMPRESSION: Patient tolerated therapy well with no adverse effects. Therapy continued to focus on progressing strength and control of the left ankle. Patient reported slight increase in pain over past week and continued difficulty with extended periods of weight bearing on the left LE. Overall range of motion is about equal to contralateral side but does demonstrate strength deficits. Progressed band resistance this visit included SLS for patient to begin performing at home. Patient would benefit from continued skilled PT to progress her mobility and strength in order to reduce pain and maximize functional ability.  Patient presents with pain and impairments/deficits in: ankle ROM (DF), R ankle strength, balance, gait.  Activity limitations include: walking and standing.   GOALS: SHORT TERM  GOALS:   STG Name Target Date Goal status  1 Grizelda will be >75% HEP compliant to improve carryover between sessions and facilitate independent management of condition   Baseline: patient compliant with HEP 01/05/2022 ACHIEVED    LONG TERM GOALS:    LTG Name Target Date Goal status  1 Tabbatha will improve FOTO score from 58 (baseline) to 68 as a proxy for functional improvement 02/09/2022 ONGOING  2 Ashiyah will improve left open chain ankle DF to 5 degrees   Baseline: patient exhibits 5 deg ankle  DF 02/09/2022 ACHIEVED  3 Boni will report >/= 50% decrease in pain from evaluation    Baseline: patient reports 2/10 pain 02/09/2022 ONGOING  4 Pearline will be able to stand for >30'' in tandem stance, to show a significant improvement in balance in order to reduce fall risk    Baseline: able to balance 30+ seconds in tandem 02/09/2022 ACHIEVED     PLAN: PT FREQUENCY: 1-2x/week   PT DURATION: 8 weeks (Ending 02/09/2022)   PLANNED INTERVENTIONS: Therapeutic exercises, Therapeutic activity, Neuro Muscular re-education, Gait training, Patient/Family education, Joint mobilization, Dry Needling, Electrical stimulation, Spinal mobilization and/or manipulation, Moist heat, Taping, Vasopneumatic device, Ionotophoresis 4mg /ml Dexamethasone, and Manual therapy   PLAN FOR NEXT SESSION: progressive balance/foot/ankle/hip/gait L ankle   Hilda Blades, PT, DPT, LAT, ATC 01/09/22  4:16 PM Phone: [phone] Fax: [phone]

## 2022-01-09 NOTE — Patient Instructions (Signed)
Access Code: [email or web address] URL: [email or web address] Date: 01/09/2022 Prepared by: Hilda Blades  Exercises Towel Scrunches - 1 x daily - 7 x weekly - 3 sets - 10 reps Seated Figure 4 Ankle Inversion with Resistance - 1 x daily - 7 x weekly - 3 sets - 10 reps Long Sitting Ankle Plantar Flexion with Resistance - 1 x daily - 7 x weekly - 3 sets - 10 reps Long Sitting Ankle Eversion with Resistance - 1 x daily - 7 x weekly - 3 sets - 10 reps Standing Gastroc Stretch at Counter - 1 x daily - 7 x weekly - 3 reps - 30 seconds hold Standing Heel Raise with Support - 1 x daily - 7 x weekly - 3 sets - 10 reps Tandem Stance with Support - 1 x daily - 7 x weekly - 3 reps - 30 seconds hold Single Leg Stance with Support - 1 x daily - 7 x weekly - 3 reps - 10-30 seconds hold

## 2022-01-16 ENCOUNTER — Ambulatory Visit: Payer: PRIVATE HEALTH INSURANCE | Admitting: Physical Therapy

## 2022-01-16 ENCOUNTER — Encounter: Payer: Self-pay | Admitting: Physical Therapy

## 2022-01-16 ENCOUNTER — Other Ambulatory Visit: Payer: Self-pay

## 2022-01-16 DIAGNOSIS — M25572 Pain in left ankle and joints of left foot: Secondary | ICD-10-CM | POA: Diagnosis not present

## 2022-01-16 DIAGNOSIS — R2689 Other abnormalities of gait and mobility: Secondary | ICD-10-CM

## 2022-01-16 DIAGNOSIS — R2681 Unsteadiness on feet: Secondary | ICD-10-CM

## 2022-01-16 DIAGNOSIS — R600 Localized edema: Secondary | ICD-10-CM

## 2022-01-16 NOTE — Therapy (Signed)
OUTPATIENT PHYSICAL THERAPY TREATMENT NOTE   Patient Name: Rebecca [name] MRN: [id number] DOB:September 11, 1971, 51 y.o., female Today's Date: 01/16/2022  PCP: Flossie Buffy, NP REFERRING PROVIDER: Armond Hang, MD   PT End of Session - 01/16/22 1532     Visit Number 6    Number of Visits 16    Date for PT Re-Evaluation 02/09/22    Authorization Type WC    Authorization - Visit Number 5    Authorization - Number of Visits 6    PT Start Time 2979    PT Stop Time 8921    PT Time Calculation (min) 45 min    Activity Tolerance Patient tolerated treatment well    Behavior During Therapy [facility] for tasks assessed/performed                 Past Medical History:  Diagnosis Date   Anemia    Colon polyp 12/25/2011   Tubular adenomatous   Depression    following death of mother   Duodenitis    DVT (deep vein thrombosis) in pregnancy    Esophagitis    Fatigue    Gallstones    GERD (gastroesophageal reflux disease)    Hemorrhagic ovarian cyst 05/22/2012   04/2012    Hiatal hernia    Infection    UTI   Obesity    PUD (peptic ulcer disease)    Sacroiliac pain    Vaginal Pap smear, abnormal    colpo, results ok   Vitamin D deficiency    Past Surgical History:  Procedure Laterality Date   BREAST EXCISIONAL BIOPSY Left    benign   COLONOSCOPY     COLPOSCOPY  2012   NEG   DILATATION & CURETTAGE/HYSTEROSCOPY WITH MYOSURE N/A 01/17/2017   Procedure: [place];  Surgeon: Servando Salina, MD;  Location: [facility];  Service: Gynecology;  Laterality: N/A;   POLYPECTOMY     TUBAL LIGATION     Patient Active Problem List   Diagnosis Date Noted   Hypnic headache 08/07/2019   Loud snoring 08/07/2019   Chronic pain of left knee 07/03/2018   Onychomycosis 05/17/2018   Tinea pedis of both feet 05/17/2018   Prediabetes 11/16/2014   Obesity 11/16/2014   Essential hypertension 11/16/2014   Mixed hyperlipidemia 10/06/2013    Cholelithiasis 05/22/2012   Benign colonic polyp 01/15/2012   Constipation 12/12/2011   Vitamin D deficiency 12/12/2011   Depression, major, single episode, complete remission ([place]) 12/12/2011   GERD (gastroesophageal reflux disease) 01/24/2007    REFERRING DIAG: Referral diagnosis: Sprain of other ligament of left ankle, initial encounter  THERAPY DIAG:  Pain in left ankle and joints of left foot  Unsteadiness on feet  Other abnormalities of gait and mobility  Localized edema  PERTINENT HISTORY: None  PRECAUTIONS: None   SUBJECTIVE: Patient report currently she is not having any pain, she did put her brace on today so that may have helped. She sees the orthopedic doctor in the morning.  PAIN:  Are you having pain? Yes NPRS scale: 0/10 Pain location: Ankle Pain orientation: Left  PAIN TYPE: Chronic Pain description: intermittent, dull ache Aggravating factors: rest Relieving factors: movement   Patient Goals: reduce pain, get back to normal   OBJECTIVE:            GENERAL OBSERVATION: Patient with a grossly normalized gait pattern   LE MMT:   MMT Right 12/15/2021 Left 12/15/2021 Left 12/19/2021 Left 01/09/2022  Ankle dorsiflexion (  L4) n 3+ 4 4+  Ankle plantarflexion (S1) Unable to toe walk Unable to toe walk 4- 4-  Ankle inversion n 3+ 4- 4  Ankle eversion n 3+ 4 4   (score listed is out of 5 possible points.  N = WNL, D = diminished, C = clear for gross weakness with myotome testing, * = concordant pain with testing)   LE ROM:   ROM Right 12/15/2021 Left 12/15/2021 Left 12/26/2021 Left 01/02/2022 Left 01/09/2022  Ankle dorsiflexion 5 degrees OC Lacking 5 OC 5 deg 5 deg 5 deg  Ankle plantarflexion N N     Ankle inversion N N     Ankle eversion N N      FUNCTIONAL TESTS:  Progressive balance screen: Tandem: able to maintain 30+ seconds on Airex SLS: able to maintain 20+ seconds on Airex   PATIENT SURVEYS:  FOTO 59% functional status - assessed  01/02/2022     TODAY'S TREATMENT: 01/16/2022 NuStep L6 x 5 min with LE while taking subjective 4-way ankle with blue 2 x 15 each Slant board calf stretch 2 x 30 seconds Heel raises on edge of box 2 x 15  SLS on Airex 3 x 20 sec each with occasional fingertip assist for balance Seated heel raise with 25# 3 x 20 (left) Sidelying hip abduction 3 x 15 each Forward heel tap on 6" box 2 x 10 Rocker board forward/backward taps 2 x 20 with occasional fingertip assist for balance   01/09/2022: NuStep L6 x 5 min with LE while taking subjective Slant board calf stretch 3 x 30 seconds 4-way ankle blue 2 x 10 (inversion perform in seated figure-4) Heel raises on edge of Airex 2 x 15 Rocker board forward/backward taps 2 x 20 with occasional fingertip assist for balance SLS 3 x 10 sec each  01/02/2022: NuStep L6 x 5 min with LE while taking subjective BAPS board seated L3 with forward/backward and lateral taps x 20 each 4-way ankle with green 2 x 10 each Slant board calf stretch 3 x 30 sec Standing heel raises 2 x 15 Tandem stance on Airex 3 x 30 sec each SLS 2 x 10 sec each  12/26/2021: NuStep L6 x 4 min with LE while taking subjective Longsitting calf stretch 2 x 30 sec 4-way ankle with red 2 x 10 each Seated towel scrunches x 20 Standing calf stretch at wall 2 x 30 sec Standing heel raises 2 x 10 Tandem stance 3 x 30 sec each    PATIENT EDUCATION:  HEP update. Pt educated via explanation, demonstration, and handout. Pt confirms understanding verbally.    HOME EXERCISE PROGRAM: Access Code: [email or web address]     ASSESSMENT: CLINICAL IMPRESSION: Patient tolerated therapy well with no adverse effects. Patient reporting no pain at beginning of therapy and denied any increase in pain with exercises this visit. She is progressing well with her strengthening and able to demonstrate improvement in single leg stance this visit on unstable surface. She does continue to exhibit strength and balance  deficit on left compared to right. Patient would benefit from continued skilled PT to progress her mobility and strength in order to reduce pain and maximize functional ability.  Patient presents with pain and impairments/deficits in: ankle ROM (DF), R ankle strength, balance, gait. Activity limitations include: walking and standing.   GOALS: SHORT TERM GOALS:   STG Name Target Date Goal status  1 Sereena will be >75% HEP compliant to improve carryover between sessions and facilitate independent management  of condition   Baseline: patient compliant with HEP 01/05/2022 ACHIEVED    LONG TERM GOALS:    LTG Name Target Date Goal status  1 Roux will improve FOTO score from 58 (baseline) to 68 as a proxy for functional improvement 02/09/2022 ONGOING  2 Emilly will improve left open chain ankle DF to 5 degrees   Baseline: patient exhibits 5 deg ankle DF 02/09/2022 ACHIEVED  3 Silva will report >/= 50% decrease in pain from evaluation    Baseline: patient reports 2/10 pain 02/09/2022 ONGOING  4 Brynlei will be able to stand for >30'' in tandem stance, to show a significant improvement in balance in order to reduce fall risk    Baseline: able to balance 30+ seconds in tandem 02/09/2022 ACHIEVED     PLAN: PT FREQUENCY: 1-2x/week   PT DURATION: 8 weeks (Ending 02/09/2022)   PLANNED INTERVENTIONS: Therapeutic exercises, Therapeutic activity, Neuro Muscular re-education, Gait training, Patient/Family education, Joint mobilization, Dry Needling, Electrical stimulation, Spinal mobilization and/or manipulation, Moist heat, Taping, Vasopneumatic device, Ionotophoresis 4mg /ml Dexamethasone, and Manual therapy   PLAN FOR NEXT SESSION: progressive balance/foot/ankle/hip/gait L ankle   Hilda Blades, PT, DPT, LAT, ATC 01/16/22  4:15 PM Phone: [phone] Fax: [phone]

## 2022-01-19 NOTE — Therapy (Incomplete)
OUTPATIENT PHYSICAL THERAPY TREATMENT NOTE   Patient Name: Rebecca [name] MRN: [id number] DOB:1971/09/08, 51 y.o., female Today's Date: 01/19/2022  PCP: Flossie Buffy, NP REFERRING PROVIDER: Flossie Buffy, NP         Past Medical History:  Diagnosis Date   Anemia    Colon polyp 12/25/2011   Tubular adenomatous   Depression    following death of mother   Duodenitis    DVT (deep vein thrombosis) in pregnancy    Esophagitis    Fatigue    Gallstones    GERD (gastroesophageal reflux disease)    Hemorrhagic ovarian cyst 05/22/2012   04/2012    Hiatal hernia    Infection    UTI   Obesity    PUD (peptic ulcer disease)    Sacroiliac pain    Vaginal Pap smear, abnormal    colpo, results ok   Vitamin D deficiency    Past Surgical History:  Procedure Laterality Date   BREAST EXCISIONAL BIOPSY Left    benign   COLONOSCOPY     COLPOSCOPY  2012   NEG   DILATATION & CURETTAGE/HYSTEROSCOPY WITH MYOSURE N/A 01/17/2017   Procedure: [place];  Surgeon: Servando Salina, MD;  Location: [facility];  Service: Gynecology;  Laterality: N/A;   POLYPECTOMY     TUBAL LIGATION     Patient Active Problem List   Diagnosis Date Noted   Hypnic headache 08/07/2019   Loud snoring 08/07/2019   Chronic pain of left knee 07/03/2018   Onychomycosis 05/17/2018   Tinea pedis of both feet 05/17/2018   Prediabetes 11/16/2014   Obesity 11/16/2014   Essential hypertension 11/16/2014   Mixed hyperlipidemia 10/06/2013   Cholelithiasis 05/22/2012   Benign colonic polyp 01/15/2012   Constipation 12/12/2011   Vitamin D deficiency 12/12/2011   Depression, major, single episode, complete remission ([place]) 12/12/2011   GERD (gastroesophageal reflux disease) 01/24/2007    REFERRING DIAG: Referral diagnosis: Sprain of other ligament of left ankle, initial encounter  THERAPY DIAG:  No diagnosis found.  PERTINENT HISTORY: None  PRECAUTIONS:  None   SUBJECTIVE: Patient report currently she is not having any pain, she did put her brace on today so that may have helped. She sees the orthopedic doctor in the morning.  PAIN:  Are you having pain? Yes NPRS scale: 0/10 Pain location: Ankle Pain orientation: Left  PAIN TYPE: Chronic Pain description: intermittent, dull ache Aggravating factors: rest Relieving factors: movement   Patient Goals: reduce pain, get back to normal   OBJECTIVE:            GENERAL OBSERVATION: Patient with a grossly normalized gait pattern   LE MMT:   MMT Right 12/15/2021 Left 12/15/2021 Left 12/19/2021 Left 01/09/2022  Ankle dorsiflexion (L4) n 3+ 4 4+  Ankle plantarflexion (S1) Unable to toe walk Unable to toe walk 4- 4-  Ankle inversion n 3+ 4- 4  Ankle eversion n 3+ 4 4   (score listed is out of 5 possible points.  N = WNL, D = diminished, C = clear for gross weakness with myotome testing, * = concordant pain with testing)   LE ROM:   ROM Right 12/15/2021 Left 12/15/2021 Left 12/26/2021 Left 01/02/2022 Left 01/09/2022  Ankle dorsiflexion 5 degrees OC Lacking 5 OC 5 deg 5 deg 5 deg  Ankle plantarflexion N N     Ankle inversion N N     Ankle eversion N N  FUNCTIONAL TESTS:  Progressive balance screen: Tandem: able to maintain 30+ seconds on Airex SLS: able to maintain 20+ seconds on Airex   PATIENT SURVEYS:  FOTO 59% functional status - assessed 01/02/2022     TODAY'S TREATMENT: 01/23/2022 NuStep L6 x 5 min with LE while taking subjective 4-way ankle with blue 2 x 15 each Slant board calf stretch 2 x 30 seconds Heel raises on edge of box 2 x 15  SLS on Airex 3 x 20 sec each with occasional fingertip assist for balance Seated heel raise with 25# 3 x 20 (left) Sidelying hip abduction 3 x 15 each Forward heel tap on 6" box 2 x 10 Rocker board forward/backward taps 2 x 20 with occasional fingertip assist for balance   01/16/2022 NuStep L6 x 5 min with LE while taking  subjective 4-way ankle with blue 2 x 15 each Slant board calf stretch 2 x 30 seconds Heel raises on edge of box 2 x 15  SLS on Airex 3 x 20 sec each with occasional fingertip assist for balance Seated heel raise with 25# 3 x 20 (left) Sidelying hip abduction 3 x 15 each Forward heel tap on 6" box 2 x 10 Rocker board forward/backward taps 2 x 20 with occasional fingertip assist for balance  01/09/2022: NuStep L6 x 5 min with LE while taking subjective Slant board calf stretch 3 x 30 seconds 4-way ankle blue 2 x 10 (inversion perform in seated figure-4) Heel raises on edge of Airex 2 x 15 Rocker board forward/backward taps 2 x 20 with occasional fingertip assist for balance SLS 3 x 10 sec each  01/02/2022: NuStep L6 x 5 min with LE while taking subjective BAPS board seated L3 with forward/backward and lateral taps x 20 each 4-way ankle with green 2 x 10 each Slant board calf stretch 3 x 30 sec Standing heel raises 2 x 15 Tandem stance on Airex 3 x 30 sec each SLS 2 x 10 sec each    PATIENT EDUCATION:  HEP update. Pt educated via explanation, demonstration, and handout. Pt confirms understanding verbally.    HOME EXERCISE PROGRAM: Access Code: [email or web address]     ASSESSMENT: CLINICAL IMPRESSION: Patient tolerated therapy well with no adverse effects. Patient reporting no pain at beginning of therapy and denied any increase in pain with exercises this visit. She is progressing well with her strengthening and able to demonstrate improvement in single leg stance this visit on unstable surface. She does continue to exhibit strength and balance deficit on left compared to right. Patient would benefit from continued skilled PT to progress her mobility and strength in order to reduce pain and maximize functional ability.  Patient presents with pain and impairments/deficits in: ankle ROM (DF), R ankle strength, balance, gait. Activity limitations include: walking and standing.   GOALS: SHORT  TERM GOALS:   STG Name Target Date Goal status  1 Elivia will be >75% HEP compliant to improve carryover between sessions and facilitate independent management of condition   Baseline: patient compliant with HEP 01/05/2022 ACHIEVED    LONG TERM GOALS:    LTG Name Target Date Goal status  1 Joe will improve FOTO score from 58 (baseline) to 68 as a proxy for functional improvement 02/09/2022 ONGOING  2 Claudeen will improve left open chain ankle DF to 5 degrees   Baseline: patient exhibits 5 deg ankle DF 02/09/2022 ACHIEVED  3 Danean will report >/= 50% decrease in pain from evaluation    Baseline: patient  reports 2/10 pain 02/09/2022 ONGOING  4 Carsen will be able to stand for >30'' in tandem stance, to show a significant improvement in balance in order to reduce fall risk    Baseline: able to balance 30+ seconds in tandem 02/09/2022 ACHIEVED     PLAN: PT FREQUENCY: 1-2x/week   PT DURATION: 8 weeks (Ending 02/09/2022)   PLANNED INTERVENTIONS: Therapeutic exercises, Therapeutic activity, Neuro Muscular re-education, Gait training, Patient/Family education, Joint mobilization, Dry Needling, Electrical stimulation, Spinal mobilization and/or manipulation, Moist heat, Taping, Vasopneumatic device, Ionotophoresis 4mg /ml Dexamethasone, and Manual therapy   PLAN FOR NEXT SESSION: progressive balance/foot/ankle/hip/gait L ankle   Hilda Blades, PT, DPT, LAT, ATC 01/19/22  1:08 PM Phone: [phone] Fax: [phone]

## 2022-01-23 ENCOUNTER — Ambulatory Visit: Payer: PRIVATE HEALTH INSURANCE | Admitting: Physical Therapy

## 2022-02-07 ENCOUNTER — Other Ambulatory Visit: Payer: Self-pay

## 2022-02-08 ENCOUNTER — Other Ambulatory Visit: Payer: Self-pay

## 2022-02-08 ENCOUNTER — Ambulatory Visit (INDEPENDENT_AMBULATORY_CARE_PROVIDER_SITE_OTHER): Payer: No Typology Code available for payment source | Admitting: Family Medicine

## 2022-02-08 VITALS — BP 118/76 | HR 85 | Temp 97.6°F | Ht 67.0 in | Wt 218.8 lb

## 2022-02-08 DIAGNOSIS — Z86718 Personal history of other venous thrombosis and embolism: Secondary | ICD-10-CM | POA: Insufficient documentation

## 2022-02-08 DIAGNOSIS — K2900 Acute gastritis without bleeding: Secondary | ICD-10-CM

## 2022-02-08 DIAGNOSIS — M79661 Pain in right lower leg: Secondary | ICD-10-CM

## 2022-02-08 MED ORDER — PANTOPRAZOLE SODIUM 40 MG PO TBEC: 40.0000 mg | DELAYED_RELEASE_TABLET | Freq: Every day | ORAL | 0 refills | Status: DC

## 2022-02-08 MED ORDER — ONDANSETRON HCL 4 MG PO TABS: 4.0000 mg | ORAL_TABLET | Freq: Three times a day (TID) | ORAL | 0 refills | Status: DC | PRN

## 2022-02-08 NOTE — Progress Notes (Signed)
?[facility] ?[facility] ?[place] ?[place] [address] ?Dept: [phone] ?Dept Fax: [phone] ? ?Office Visit ? ?Subjective:  ? ? Patient ID: Rebecca [name], female    DOB: Apr 18, 1971, 51 y.o..   MRN: [id number] ? ?Chief Complaint  ?Patient presents with  ? Acute Visit  ?  C/o having Rt calf paine x 5 days, also feeling nauseated x 3 days.  Has taken tums and pepto.   ? ? ?History of Present Illness: ? ?Patient is in today complaining of right calf pain starting 5 days ago. She states she had a DVT about 30 years ago, during a pregnancy. She was treated with anticoagulants for  a period of time and then theses were stopped. She has not had a recurrence since then, but admits it is on her mind. The calf pain started suddenly. She has not tried any particular treatments for it. ? ?Ms. Ocanas also notes a 3-day history of nausea. She states she had eaten some green beans last week. Afterwards she had nausea and discomfort in her left upper abdomen. This has continued with eating since that time. She is not having any diarrhea. She has a history of cholelithiasis, but denies any RUQ pain.  ? ?Past Medical History: ?Patient Active Problem List  ? Diagnosis Date Noted  ? Hypnic headache 08/07/2019  ? Loud snoring 08/07/2019  ? Chronic pain of left knee 07/03/2018  ? Onychomycosis 05/17/2018  ? Tinea pedis of both feet 05/17/2018  ? Prediabetes 11/16/2014  ? Obesity 11/16/2014  ? Essential hypertension 11/16/2014  ? Mixed hyperlipidemia 10/06/2013  ? Cholelithiasis 05/22/2012  ? Benign colonic polyp 01/15/2012  ? Constipation 12/12/2011  ? Vitamin D deficiency 12/12/2011  ? Depression, major, single episode, complete remission ([place]) 12/12/2011  ? GERD (gastroesophageal reflux disease) 01/24/2007  ? ?Past Surgical History:  ?Procedure Laterality Date  ? BREAST EXCISIONAL BIOPSY Left   ? benign  ? COLONOSCOPY    ? COLPOSCOPY  2012  ? NEG  ? DILATATION &  CURETTAGE/HYSTEROSCOPY WITH MYOSURE N/A 01/17/2017  ? Procedure: [place];  Surgeon: Servando Salina, MD;  Location: [facility];  Service: Gynecology;  Laterality: N/A;  ? POLYPECTOMY    ? TUBAL LIGATION    ? ?Family History  ?Problem Relation Age of Onset  ? Aneurysm Mother   ? Heart disease Mother   ? Hypertension Mother   ? Stroke Mother   ? Lupus Mother   ? Diabetes Maternal Aunt   ? Cancer Maternal Aunt   ? Colon cancer Neg Hx   ? Esophageal cancer Neg Hx   ? Stomach cancer Neg Hx   ? Rectal cancer Neg Hx   ? Colon polyps Neg Hx   ? ?Outpatient Medications Prior to Visit  ?Medication Sig Dispense Refill  ? DULoxetine (CYMBALTA) 60 MG capsule Take 1 capsule by mouth daily. 90 capsule 3  ? ?Facility-Administered Medications Prior to Visit  ?Medication Dose Route Frequency Provider Last Rate Last Admin  ? 0.9 %  sodium chloride infusion  500 mL Intravenous Once Pyrtle, Lajuan Lines, MD      ? ?Allergies  ?Allergen Reactions  ? Penicillins Hives  ?   ?Objective:  ? ?Today's Vitals  ? 02/08/22 1317  ?BP: 118/76  ?Pulse: 85  ?Temp: 97.6 ?F (36.4 ?C)  ?TempSrc: Temporal  ?SpO2: 97%  ?Weight: 218 lb 12.8 oz (99.2 kg)  ?Height: '5\' 7"'$  (1.702 m)  ? ?Body mass index is 34.27  kg/m?.  ? ?General: Well developed, well nourished. No acute distress. ?Abdomen: Soft. Mildly tender in the left upper quadrant to left epigastrium. Bowel sounds positive,  ? normal pitch and frequency. No hepatosplenomegaly. No rebound or guarding. ?Extremities: No edema noted in the RLE. No palpable cords. Mild tenderness on palpation. Pain is  ? increased with resisted plantar flexion of ankle. ?Psych: Alert and oriented. Normal mood and affect. ? ?Health Maintenance Due  ?Topic Date Due  ? Hepatitis C Screening  Never done  ? Zoster Vaccines- Shingrix (1 of 2) Never done  ?   ?Assessment & Plan:  ? ?1. Right calf pain ?I suspect she has some muscle strain or tendinitis of the calf muscles. I see no clear sign of a  recurrent DVT. I will check a D-dimer. If positive, we would proceed to an ultrasound. If negative, I recommend she put heat over the calf in the evenings and avoid inciting activities. I will have her avoid NSAIDs for now in light of symptoms of gastritis. ? ?- D-Dimer, Quantitative; Future ? ?2. Acute gastritis without hemorrhage, unspecified gastritis type ?The abdominal exam is more consistent with gastritis. No sign of cholecystitis. I recommend she try a PPI for 2 weeks. I will also provide some Zofran for nausea. ? ?- pantoprazole (PROTONIX) 40 MG tablet; Take 1 tablet (40 mg total) by mouth daily.  Dispense: 14 tablet; Refill: 0 ?- ondansetron (ZOFRAN) 4 MG tablet; Take 1 tablet (4 mg total) by mouth every 8 (eight) hours as needed for nausea or vomiting.  Dispense: 20 tablet; Refill: 0 ? ?Return if symptoms worsen or fail to improve.  ? ?Haydee Salter, MD ?

## 2022-02-14 LAB — D-DIMER, QUANTITATIVE: D-DIMER: 0.59 mg/L FEU — ABNORMAL HIGH (ref 0.00–0.49)

## 2022-02-14 LAB — SPECIMEN STATUS REPORT

## 2022-02-16 ENCOUNTER — Encounter: Payer: Self-pay | Admitting: Nurse Practitioner

## 2022-02-22 ENCOUNTER — Encounter: Payer: Self-pay | Admitting: Nurse Practitioner

## 2022-03-22 ENCOUNTER — Encounter: Payer: Self-pay | Admitting: Physician Assistant

## 2022-03-22 ENCOUNTER — Other Ambulatory Visit (HOSPITAL_COMMUNITY): Payer: Self-pay

## 2022-03-22 ENCOUNTER — Ambulatory Visit (INDEPENDENT_AMBULATORY_CARE_PROVIDER_SITE_OTHER): Payer: No Typology Code available for payment source | Admitting: Physician Assistant

## 2022-03-22 ENCOUNTER — Other Ambulatory Visit (HOSPITAL_COMMUNITY)
Admission: RE | Admit: 2022-03-22 | Discharge: 2022-03-22 | Disposition: A | Discharge status: Home / Self Care | Payer: No Typology Code available for payment source | Source: Ambulatory Visit | Attending: Physician Assistant | Admitting: Physician Assistant

## 2022-03-22 DIAGNOSIS — N3000 Acute cystitis without hematuria: Secondary | ICD-10-CM | POA: Diagnosis not present

## 2022-03-22 DIAGNOSIS — R300 Dysuria: Secondary | ICD-10-CM | POA: Insufficient documentation

## 2022-03-22 LAB — POCT URINALYSIS DIP (CLINITEK)
Bilirubin, UA: NEGATIVE
Blood, UA: NEGATIVE
Glucose, UA: NEGATIVE mg/dL
Ketones, POC UA: NEGATIVE mg/dL
Leukocytes, UA: NEGATIVE
Nitrite, UA: POSITIVE — AB
POC PROTEIN,UA: NEGATIVE
Spec Grav, UA: >=1.03 — AB (ref 1.010–1.025)
Urobilinogen, UA: 0.2 E.U./dL
pH, UA: 6.5 (ref 5.0–8.0)

## 2022-03-22 MED ORDER — NITROFURANTOIN MONOHYD MACRO 100 MG PO CAPS
100.0000 mg | ORAL_CAPSULE | Freq: Two times a day (BID) | ORAL | 0 refills | Status: DC
  Filled 2022-03-22 (×3): qty 10, 5d supply, fill #0

## 2022-03-22 MED ORDER — NITROFURANTOIN MONOHYD MACRO 100 MG PO CAPS: 100.0000 mg | ORAL_CAPSULE | Freq: Two times a day (BID) | ORAL | 0 refills | Status: DC

## 2022-03-22 NOTE — Progress Notes (Signed)
Patient reports bladder pain being present for the past few days. ?Patient has eaten today. ? ?

## 2022-03-22 NOTE — Patient Instructions (Signed)
You are going to take Macrobid twice a day for the next 5 days.  I encourage you to drink lots of water, and get plenty of rest. ? ?We will call you with today's lab results.  I hope that you feel better soon. ? ?Kennieth Rad, PA-C ?Physician Assistant ?[place] ?[email or web address] ? ? ?Urinary Tract Infection, Adult ? ?A urinary tract infection (UTI) is an infection of any part of the urinary tract. The urinary tract includes the kidneys, ureters, bladder, and urethra. These organs make, store, and get rid of urine in the body. ?An upper UTI affects the ureters and kidneys. A lower UTI affects the bladder and urethra. ?What are the causes? ?Most urinary tract infections are caused by bacteria in your genital area around your urethra, where urine leaves your body. These bacteria grow and cause inflammation of your urinary tract. ?What increases the risk? ?You are more likely to develop this condition if: ?You have a urinary catheter that stays in place. ?You are not able to control when you urinate or have a bowel movement (incontinence). ?You are female and you: ?Use a spermicide or diaphragm for birth control. ?Have low estrogen levels. ?Are pregnant. ?You have certain genes that increase your risk. ?You are sexually active. ?You take antibiotic medicines. ?You have a condition that causes your flow of urine to slow down, such as: ?An enlarged prostate, if you are female. ?Blockage in your urethra. ?A kidney stone. ?A nerve condition that affects your bladder control (neurogenic bladder). ?Not getting enough to drink, or not urinating often. ?You have certain medical conditions, such as: ?Diabetes. ?A weak disease-fighting system (immunesystem). ?Sickle cell disease. ?Gout. ?Spinal cord injury. ?What are the signs or symptoms? ?Symptoms of this condition include: ?Needing to urinate right away (urgency). ?Frequent urination. This may include small amounts  of urine each time you urinate. ?Pain or burning with urination. ?Blood in the urine. ?Urine that smells bad or unusual. ?Trouble urinating. ?Cloudy urine. ?Vaginal discharge, if you are female. ?Pain in the abdomen or the lower back. ?You may also have: ?Vomiting or a decreased appetite. ?Confusion. ?Irritability or tiredness. ?A fever or chills. ?Diarrhea. ?The first symptom in older adults may be confusion. In some cases, they may not have any symptoms until the infection has worsened. ?How is this diagnosed? ?This condition is diagnosed based on your medical history and a physical exam. You may also have other tests, including: ?Urine tests. ?Blood tests. ?Tests for STIs (sexually transmitted infections). ?If you have had more than one UTI, a cystoscopy or imaging studies may be done to determine the cause of the infections. ?How is this treated? ?Treatment for this condition includes: ?Antibiotic medicine. ?Over-the-counter medicines to treat discomfort. ?Drinking enough water to stay hydrated. ?If you have frequent infections or have other conditions such as a kidney stone, you may need to see a health care provider who specializes in the urinary tract (urologist). ?In rare cases, urinary tract infections can cause sepsis. Sepsis is a life-threatening condition that occurs when the body responds to an infection. Sepsis is treated in the hospital with IV antibiotics, fluids, and other medicines. ?Follow these instructions at home: ? ?Medicines ?Take over-the-counter and prescription medicines only as told by your health care provider. ?If you were prescribed an antibiotic medicine, take it as told by your health care provider. Do not stop using the antibiotic even if you start to feel better. ?General instructions ?Make sure you: ?Empty your bladder often and  completely. Do not hold urine for long periods of time. ?Empty your bladder after sex. ?Wipe from front to back after urinating or having a bowel movement  if you are female. Use each tissue only one time when you wipe. ?Drink enough fluid to keep your urine pale yellow. ?Keep all follow-up visits. This is important. ?Contact a health care provider if: ?Your symptoms do not get better after 1-2 days. ?Your symptoms go away and then return. ?Get help right away if: ?You have severe pain in your back or your lower abdomen. ?You have a fever or chills. ?You have nausea or vomiting. ?Summary ?A urinary tract infection (UTI) is an infection of any part of the urinary tract, which includes the kidneys, ureters, bladder, and urethra. ?Most urinary tract infections are caused by bacteria in your genital area. ?Treatment for this condition often includes antibiotic medicines. ?If you were prescribed an antibiotic medicine, take it as told by your health care provider. Do not stop using the antibiotic even if you start to feel better. ?Keep all follow-up visits. This is important. ?This information is not intended to replace advice given to you by your health care provider. Make sure you discuss any questions you have with your health care provider. ?Document Revised: 06/25/2020 Document Reviewed: 06/25/2020 ?Elsevier Patient Education ? [place]. ? ?

## 2022-03-22 NOTE — Progress Notes (Signed)
? Office Visit ? ?Subjective   ? ?Patient ID: Rebecca [name], female    DOB: 1971/08/17  Age: 51 y.o. MRN: [id number] ? ?CC:  ?Chief Complaint  ?Patient presents with  ? Cystitis  ?   ?  ?Virtual Visit via Telephone Note ? ?I connected with Rebecca [name] on 03/22/22 at  2:40 PM EDT by telephone and verified that I am speaking with the correct person using two identifiers. ? ?Location: ?Patient: Work ?Provider: Primary [facility] at [facility] ?  ?I discussed the limitations, risks, security and privacy concerns of performing an evaluation and management service by telephone and the availability of in person appointments. I also discussed with the patient that there may be a patient responsible charge related to this service. The patient expressed understanding and agreed to proceed. ? ? ?History of Present Illness: ?Rebecca [name] states that she has been having bilateral lower back pain, and bladder spasms as for the past 4 to 5 days.  States that she has been having a small amount of dysuria, increased frequency.   ? ?States that she has been trying cranberry juice, and water without relief. ?  ?Observations/Objective: ?Medical history and current medications reviewed, no physical exam completed ? ? ? ?Outpatient Encounter Medications as of 03/22/2022  ?Medication Sig  ? [DISCONTINUED] nitrofurantoin, macrocrystal-monohydrate, (MACROBID) 100 MG capsule Take 1 capsule (100 mg total) by mouth 2 (two) times daily for 5 days.  ? DULoxetine (CYMBALTA) 60 MG capsule Take 1 capsule by mouth daily.  ? nitrofurantoin, macrocrystal-monohydrate, (MACROBID) 100 MG capsule Take 1 capsule (100 mg total) by mouth 2 (two) times daily for 5 days  ? ondansetron (ZOFRAN) 4 MG tablet Take 1 tablet (4 mg total) by mouth every 8 (eight) hours as needed for nausea or vomiting.  ? pantoprazole (PROTONIX) 40 MG tablet Take 1 tablet (40 mg total) by mouth daily.  ? ?No facility-administered encounter medications on file as of 03/22/2022.   ? ? ?Past Medical History:  ?Diagnosis Date  ? Anemia   ? Colon polyp 12/25/2011  ? Tubular adenomatous  ? Depression   ? following death of mother  ? Duodenitis   ? DVT (deep vein thrombosis) in pregnancy   ? Esophagitis   ? Fatigue   ? Gallstones   ? GERD (gastroesophageal reflux disease)   ? Hemorrhagic ovarian cyst 05/22/2012  ? 04/2012   ? Hiatal hernia   ? Infection   ? UTI  ? Obesity   ? PUD (peptic ulcer disease)   ? Sacroiliac pain   ? Vaginal Pap smear, abnormal   ? colpo, results ok  ? Vitamin D deficiency   ? ? ?Past Surgical History:  ?Procedure Laterality Date  ? BREAST EXCISIONAL BIOPSY Left   ? benign  ? COLONOSCOPY    ? COLPOSCOPY  2012  ? NEG  ? DILATATION & CURETTAGE/HYSTEROSCOPY WITH MYOSURE N/A 01/17/2017  ? Procedure: [place];  Surgeon: Servando Salina, MD;  Location: [facility];  Service: Gynecology;  Laterality: N/A;  ? POLYPECTOMY    ? TUBAL LIGATION    ? ? ?Family History  ?Problem Relation Age of Onset  ? Aneurysm Mother   ? Heart disease Mother   ? Hypertension Mother   ? Stroke Mother   ? Lupus Mother   ? Diabetes Maternal Aunt   ? Cancer Maternal Aunt   ? Colon cancer Neg Hx   ? Esophageal cancer Neg Hx   ? Stomach cancer Neg  Hx   ? Rectal cancer Neg Hx   ? Colon polyps Neg Hx   ? ? ?Social History  ? ?Socioeconomic History  ? Marital status: Married  ?  Spouse name: Not on file  ? Number of children: 2  ? Years of education: Not on file  ? Highest education level: Not on file  ?Occupational History  ? Occupation: NSNT  ?  Employer: [employer]  ?Tobacco Use  ? Smoking status: Some Days  ?  Packs/day: 0.10  ?  Years: 5.00  ?  Pack years: 0.50  ?  Types: Cigarettes  ? Smokeless tobacco: Never  ? Tobacco comments:  ?  4 cigs a day   ?Vaping Use  ? Vaping Use: Never used  ?Substance and Sexual Activity  ? Alcohol use: Yes  ?  Alcohol/week: 0.0 standard drinks  ?  Comment: occasional glass of wine  ? Drug use: No  ? Sexual activity: Yes  ?  Birth  control/protection: Post-menopausal  ?Other Topics Concern  ? Not on file  ?Social History Narrative  ? Not on file  ? ?Social Determinants of Health  ? ?Financial Resource Strain: Not on file  ?Food Insecurity: Not on file  ?Transportation Needs: Not on file  ?Physical Activity: Not on file  ?Stress: Not on file  ?Social Connections: Not on file  ?Intimate Partner Violence: Not on file  ? ? ?Review of Systems  ?Constitutional:  Negative for chills and fever.  ?HENT: Negative.    ?Eyes: Negative.   ?Respiratory:  Negative for shortness of breath.   ?Cardiovascular:  Negative for chest pain.  ?Gastrointestinal:  Negative for diarrhea, nausea and vomiting.  ?Genitourinary:  Positive for dysuria, frequency and urgency. Negative for flank pain and hematuria.  ?Musculoskeletal:  Positive for back pain. Negative for myalgias.  ?Skin: Negative.   ?Neurological: Negative.   ?Endo/Heme/Allergies: Negative.   ?Psychiatric/Behavioral: Negative.    ? ?  ? ?  ?Assessment & [name]:  ? ?Problem List Items Addressed This Visit   ?None ?Visit Diagnoses   ? ? Acute cystitis without hematuria    -  Primary  ? Relevant Orders  ? POCT URINALYSIS DIP (CLINITEK) (Completed)  ? Cervicovaginal ancillary only  ? Urine Culture  ? ?  ? ?Assessment and [name]: ?1. Acute cystitis without hematuria ?Patient was able to submit UA and vaginal swab.  UA positive for urinary tract infection.  Trial Macrobid.  Patient education given on supportive care, red flags given for prompt reevaluation ?- POCT URINALYSIS DIP (CLINITEK) ?- Cervicovaginal ancillary only ?- Urine Culture ? ?Follow Up Instructions: ? ?  ?I discussed the assessment and treatment [name] with the patient. The patient was provided an opportunity to ask questions and all were answered. The patient agreed with the [name] and demonstrated an understanding of the instructions. ?  ?The patient was advised to call back or seek an in-person evaluation if the symptoms worsen or if the condition fails  to improve as anticipated. ? ?I provided 15 minutes of non-face-to-face time during this encounter. ? ?Return if symptoms worsen or fail to improve.  ? ?Kyrstin Campillo S Mayers, PA-C ? ? ?

## 2022-03-23 LAB — CERVICOVAGINAL ANCILLARY ONLY
Bacterial Vaginitis (gardnerella): NEGATIVE
Chlamydia: NEGATIVE
Comment: NEGATIVE
Comment: NEGATIVE
Comment: NEGATIVE
Comment: NORMAL
Neisseria Gonorrhea: NEGATIVE
Trichomonas: NEGATIVE

## 2022-03-26 LAB — URINE CULTURE

## 2022-03-28 ENCOUNTER — Encounter: Payer: Self-pay | Admitting: Nurse Practitioner

## 2022-04-10 ENCOUNTER — Encounter: Payer: Self-pay | Admitting: Nurse Practitioner

## 2022-04-10 ENCOUNTER — Other Ambulatory Visit (HOSPITAL_COMMUNITY): Payer: Self-pay

## 2022-04-10 ENCOUNTER — Ambulatory Visit (INDEPENDENT_AMBULATORY_CARE_PROVIDER_SITE_OTHER): Payer: No Typology Code available for payment source | Admitting: Nurse Practitioner

## 2022-04-10 VITALS — BP 138/77 | HR 77 | Temp 97.1°F | Ht 67.0 in | Wt 219.8 lb

## 2022-04-10 DIAGNOSIS — F3342 Major depressive disorder, recurrent, in full remission: Secondary | ICD-10-CM

## 2022-04-10 DIAGNOSIS — F325 Major depressive disorder, single episode, in full remission: Secondary | ICD-10-CM | POA: Diagnosis not present

## 2022-04-10 DIAGNOSIS — Z0001 Encounter for general adult medical examination with abnormal findings: Secondary | ICD-10-CM

## 2022-04-10 DIAGNOSIS — R7303 Prediabetes: Secondary | ICD-10-CM | POA: Diagnosis not present

## 2022-04-10 DIAGNOSIS — Z1159 Encounter for screening for other viral diseases: Secondary | ICD-10-CM

## 2022-04-10 DIAGNOSIS — E782 Mixed hyperlipidemia: Secondary | ICD-10-CM

## 2022-04-10 DIAGNOSIS — E6609 Other obesity due to excess calories: Secondary | ICD-10-CM

## 2022-04-10 DIAGNOSIS — E559 Vitamin D deficiency, unspecified: Secondary | ICD-10-CM | POA: Diagnosis not present

## 2022-04-10 DIAGNOSIS — Z23 Encounter for immunization: Secondary | ICD-10-CM

## 2022-04-10 DIAGNOSIS — Z6834 Body mass index (BMI) 34.0-34.9, adult: Secondary | ICD-10-CM

## 2022-04-10 DIAGNOSIS — K8020 Calculus of gallbladder without cholecystitis without obstruction: Secondary | ICD-10-CM | POA: Diagnosis not present

## 2022-04-10 MED ORDER — WEGOVY 0.25 MG/0.5ML ~~LOC~~ SOAJ
SUBCUTANEOUS | 1 refills | Status: DC
  Filled 2022-04-10: qty 2, 28d supply, fill #0

## 2022-04-10 MED ORDER — WEGOVY 0.25 MG/0.5ML ~~LOC~~ SOAJ
SUBCUTANEOUS | 1 refills | Status: DC
  Filled 2022-04-10: qty 2, fill #0
  Filled 2022-04-10 – 2022-07-31 (×3): qty 2, 28d supply, fill #0

## 2022-04-10 MED ORDER — DULOXETINE HCL 60 MG PO CPEP
60.0000 mg | ORAL_CAPSULE | Freq: Every day | ORAL | 3 refills | Status: DC
  Filled 2022-04-10: qty 90, 90d supply, fill #0
  Filled 2022-07-31 – 2022-10-18 (×2): qty 90, 90d supply, fill #1
  Filled 2023-01-11 – 2023-01-30 (×2): qty 90, 90d supply, fill #2

## 2022-04-10 NOTE — Patient Instructions (Addendum)
Sign medical release to get records from GYN: Dr. Maudry Diego ?Get fasting blood draw. Need to be fasting 8hrs prior to blood draw. ?Start heart healthy diet and daily exercise ?Use wrist brace daily x 2weeks. ?Use oral ibuprofen or voltaren gel as needed for pain ?Apply cold compress as needed to left wrist. ? ?How to Increase Your Level of Physical Activity ?Getting regular physical activity is important for your overall health and well-being. Most people do not get enough exercise. There are easy ways to increase your level of physical activity, even if you have not been very active in the past or if you are just starting out. ?What are the benefits of physical activity? ?Physical activity has many short-term and long-term benefits. Being active on a regular basis can improve your physical and mental health as well as provide other benefits. ?Physical health benefits ?Helping you lose weight or maintain a healthy weight. ?Strengthening your muscles and bones. ?Reducing your risk of certain long-term (chronic) diseases, including heart disease, cancer, and diabetes. ?Being able to move around more easily and for longer periods of time without getting tired (increased endurance or stamina). ?Improving your ability to fight off illness (enhanced immunity). ?Being able to sleep better. ?Helping you stay healthy as you get older, including: ?Helping you stay mobile, or capable of walking and moving around. ?Preventing accidents, such as falls. ?Increasing life expectancy. ?Mental health benefits ?Boosting your mood and improving your self-esteem. ?Lowering your chance of having mental health problems, such as depression or anxiety. ?Helping you feel good about your body. ?Other benefits ?Finding new sources of fun and enjoyment. ?Meeting new people who share a common interest. ?Before you begin ?If you have a chronic illness or have not been active for a while, check with your health care provider about how to get started.  Ask your health care provider what activities are safe for you. ?Start out slowly. Walking or doing some simple chair exercises is a good place to start, especially if you have not been active before or for a long time. ?Set goals that you can work toward. Ask your health care provider how much exercise is best for you. In general, most adults should: ?Do moderate-intensity exercise for at least 150 minutes each week (30 minutes on most days of the week) or vigorous exercise for at least 75 minutes each week, or a combination of these. ?Moderate-intensity exercise can include walking at a quick pace, biking, yoga, water aerobics, or gardening. ?Vigorous exercise involves activities that take more effort, such as jogging or running, playing sports, swimming laps, or jumping rope. ?Do strength exercises on at least 2 days each week. This can include weight lifting, body weight exercises, and resistance-band exercises. ?How to be more physically active ?Make a plan ? ?Try to find activities that you enjoy. You are more likely to commit to an exercise routine if it does not feel like a chore. ?If you have bone or joint problems, choose low-impact exercises, like walking or swimming. ?Use these tips for being successful with an exercise plan: ?Find a workout partner for accountability. ?Join a group or class, such as an aerobics class, cycling class, or sports team. ?Make family time active. Go for a walk, bike, or swim. ?Include a variety of exercises each week. ?Consider using a fitness tracker, such as a mobile phone app or a device worn like a watch, that will count the number of steps you take each day. Many people strive to reach 10,000  steps a day. ?Find ways to be active in your daily routines ?Besides your formal exercise plans, you can find ways to do physical activity during your daily routines, such as: ?Walking or biking to work or to the store. ?Taking the stairs instead of the elevator. ?Parking farther  away from the door at work or at the store. ?Planning walking meetings. ?Walking around while you are on the phone. ?Where to find more information ?Centers for Disease Control and Prevention: WorkDashboard.es ?President's Council on [employer], Sports & Nutrition: www.fitness.gov ?ChooseMyPlate: MassVoice.es ?Contact a health care provider if: ?You have headaches, muscle aches, or joint pain that is concerning. ?You feel dizzy or light-headed while exercising. ?You faint. ?You feel your heart skipping, racing, or fluttering. ?You have chest pain while exercising. ?Summary ?Exercise benefits your mind and body at any age, even if you are just starting out. ?If you have a chronic illness or have not been active for a while, check with your health care provider before increasing your physical activity. ?Choose activities that are safe and enjoyable for you. Ask your health care provider what activities are safe for you. ?Start slowly. Tell your health care provider if you have problems as you start to increase your activity level. ?This information is not intended to replace advice given to you by your health care provider. Make sure you discuss any questions you have with your health care provider. ?Document Revised: 03/11/2021 Document Reviewed: 03/11/2021 ?Elsevier Patient Education ? [place]. ? ? ?Calorie Counting for Weight Loss ?Calories are units of energy. Your body needs a certain number of calories from food to keep going throughout the day. When you eat or drink more calories than your body needs, your body stores the extra calories mostly as fat. When you eat or drink fewer calories than your body needs, your body burns fat to get the energy it needs. ?Calorie counting means keeping track of how many calories you eat and drink each day. Calorie counting can be helpful if you need to lose weight. If you eat fewer calories than your body needs, you should lose weight. Ask your health  care provider what a healthy weight is for you. ?For calorie counting to work, you will need to eat the right number of calories each day to lose a healthy amount of weight per week. A dietitian can help you figure out how many calories you need in a day and will suggest ways to reach your calorie goal. ?A healthy amount of weight to lose each week is usually 1-2 lb (0.5-0.9 kg). This usually means that your daily calorie intake should be reduced by 500-750 calories. ?Eating 1,200-1,500 calories a day can help most women lose weight. ?Eating 1,500-1,800 calories a day can help most men lose weight. ?What do I need to know about calorie counting? ?Work with your health care provider or dietitian to determine how many calories you should get each day. To meet your daily calorie goal, you will need to: ?Find out how many calories are in each food that you would like to eat. Try to do this before you eat. ?Decide how much of the food you plan to eat. ?Keep a food log. Do this by writing down what you ate and how many calories it had. ?To successfully lose weight, it is important to balance calorie counting with a healthy lifestyle that includes regular activity. ?Where do I find calorie information? ? ?The number of calories in a food can be  found on a Nutrition Facts label. If a food does not have a Nutrition Facts label, try to look up the calories online or ask your dietitian for help. ?Remember that calories are listed per serving. If you choose to have more than one serving of a food, you will have to multiply the calories per serving by the number of servings you plan to eat. For example, the label on a package of bread might say that a serving size is 1 slice and that there are 90 calories in a serving. If you eat 1 slice, you will have eaten 90 calories. If you eat 2 slices, you will have eaten 180 calories. ?How do I keep a food log? ?After each time that you eat, record the following in your food log as soon  as possible: ?What you ate. Be sure to include toppings, sauces, and other extras on the food. ?How much you ate. This can be measured in cups, ounces, or number of items. ?How many calories were in each

## 2022-04-10 NOTE — Assessment & Plan Note (Addendum)
Repeat hgbA1c: stable at 5.9%

## 2022-04-10 NOTE — Assessment & Plan Note (Addendum)
Repeat Vit. D: low Sent weekly 50000IU x 12weeks

## 2022-04-10 NOTE — Progress Notes (Signed)
Complete physical exam  Patient: Rebecca [name]   DOB: 1971/02/28   51 y.o. Female  MRN: [id number] Visit Date: 04/14/2022  Subjective:    Chief Complaint  Patient presents with   Annual Exam    CPE Pt not fasting. Shingles vaccine given today. Concerns of numbness in left hand x 1 week. No other concerns.    RUBBY [name] is a 51 y.o. female who presents today for a complete physical exam. She reports consuming a general diet.  none  She generally feels well. She reports sleeping well. She does have additional problems to discuss today.  Vision:Yes Dental:No STD Screen:No Wt Readings from Last 3 Encounters:  04/10/22 219 lb 12.8 oz (99.7 kg)  02/08/22 218 lb 12.8 oz (99.2 kg)  09/22/21 218 lb 6.4 oz (99.1 kg)    Most recent fall risk assessment:    04/10/2022   10:55 AM  Fall Risk   Falls in the past year? 0  Number falls in past yr: 0  Injury with Fall? 0     Most recent depression screenings:    04/10/2022    2:18 PM 04/10/2022   11:26 AM  PHQ 2/9 Scores  PHQ - 2 Score 0 0  PHQ- 9 Score 0     Wrist Pain  The pain is present in the left wrist. This is a new problem. The current episode started in the past 7 days. There has been no history of extremity trauma. The problem occurs constantly. The problem has been waxing and waning. The quality of the pain is described as aching. The pain is moderate. Associated symptoms include tingling. Pertinent negatives include no fever, inability to bear weight, itching, joint locking, joint swelling, limited range of motion, numbness or stiffness. 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 She has tried nothing for the symptoms.   Depression, major, single episode, complete remission ([place]) Stable with cymbalta Refill sent  Prediabetes Repeat hgbA1c: stable at 5.9%  Obesity Reports difficulty with weight loss due to exercise inconsistency and craving for high sugar  foods. Weight loss attempts: 24yrs agoTried phentermine and walking for exercise x 52months, unknown amount of weight loss, 32yrs ago. Stopped phentermine due to palpitations. 9yrs ago Tried keto diet x 46months, lost 10lbs. She wants to try wegovy injection. She is aware of possible side effects and med contraindications.  Advised about the importance of incorporating heart healthy diet, decrease daily calories and daily exercise, in combination with GLP-1 injection. Provided printed information. Sent wegovy 0.$RemoveBefor'25mg'lBzAGZuVcAKc$  to 0.$Rem'5mg'Cahy$  weekly. F/up in 6weeks. Check hgbA1c, CMP, TSH, lipid panel.  Vitamin D deficiency Repeat Vit. D: low Sent weekly 50000IU x 12weeks  Cholelithiasis No acute exacerbation in last 28months.  Past Medical History:  Diagnosis Date   Anemia    Colon polyp 12/25/2011   Tubular adenomatous   Depression    following death of mother   Duodenitis    DVT (deep vein thrombosis) in pregnancy    Esophagitis    Fatigue    Gallstones    GERD (gastroesophageal reflux disease)    Hemorrhagic ovarian cyst 05/22/2012   04/2012    Hiatal hernia    Infection    UTI   Obesity    PUD (peptic ulcer disease)    Sacroiliac pain    Vaginal Pap smear, abnormal    colpo, results ok   Vitamin D deficiency    Past Surgical History:  Procedure Laterality Date  BREAST EXCISIONAL BIOPSY Left    benign   COLONOSCOPY     COLPOSCOPY  2012   NEG   DILATATION & CURETTAGE/HYSTEROSCOPY WITH MYOSURE N/A 01/17/2017   Procedure: [place];  Surgeon: Servando Salina, MD;  Location: [facility];  Service: Gynecology;  Laterality: N/A;   POLYPECTOMY     TUBAL LIGATION     Social History   Socioeconomic History   Marital status: Married    Spouse name: Not on file   Number of children: 2   Years of education: Not on file   Highest education level: Not on file  Occupational History   Occupation: NSNT    Employer: [employer]  Tobacco Use    Smoking status: Some Days    Packs/day: 0.10    Years: 5.00    Pack years: 0.50    Types: Cigarettes   Smokeless tobacco: Never   Tobacco comments:    4 cigs a day   Vaping Use   Vaping Use: Never used  Substance and Sexual Activity   Alcohol use: Yes    Alcohol/week: 0.0 standard drinks    Comment: occasional glass of wine   Drug use: No   Sexual activity: Yes    Birth control/protection: Post-menopausal  Other Topics Concern   Not on file  Social History Narrative   Not on file   Social Determinants of Health   Financial Resource Strain: Not on file  Food Insecurity: Not on file  Transportation Needs: Not on file  Physical Activity: Not on file  Stress: Not on file  Social Connections: Not on file  Intimate Partner Violence: Not on file   Family Status  Relation Name Status   Mother  Deceased at age 29       brain Aneurysm   Mat Aunt  (Not Specified)   Neg Hx  (Not Specified)   Family History  Problem Relation Age of Onset   Aneurysm Mother    Heart disease Mother    Hypertension Mother    Stroke Mother    Lupus Mother    Diabetes Maternal Aunt    Cancer Maternal Aunt    Colon cancer Neg Hx    Esophageal cancer Neg Hx    Stomach cancer Neg Hx    Rectal cancer Neg Hx    Colon polyps Neg Hx    Allergies  Allergen Reactions   Penicillins Hives    Patient Care Team: Adaleah Forget, Charlene Brooke, NP as PCP - General (Internal Medicine) Minette Headland, DDS as Referring Physician ([place]) Rutherford Guys, MD as Consulting Physician (Ophthalmology) Pyrtle, Lajuan Lines, MD as Consulting Physician (Gastroenterology) Servando Salina, MD as Consulting Physician (Obstetrics and Gynecology) Kennith Center, RD as Dietitian (Family Medicine)   Medications: Outpatient Medications Prior to Visit  Medication Sig   [DISCONTINUED] DULoxetine (CYMBALTA) 60 MG capsule Take 1 capsule by mouth daily.   [DISCONTINUED] nitrofurantoin, macrocrystal-monohydrate,  (MACROBID) 100 MG capsule Take 1 capsule (100 mg total) by mouth 2 (two) times daily for 5 days   [DISCONTINUED] ondansetron (ZOFRAN) 4 MG tablet Take 1 tablet (4 mg total) by mouth every 8 (eight) hours as needed for nausea or vomiting.   [DISCONTINUED] pantoprazole (PROTONIX) 40 MG tablet Take 1 tablet (40 mg total) by mouth daily.   No facility-administered medications prior to visit.    Review of Systems  Constitutional:  Negative for fever.  HENT:  Negative for congestion and sore throat.   Eyes:  Negative for visual changes  Respiratory:  Negative for cough and shortness of breath.   Cardiovascular:  Negative for chest pain, palpitations and leg swelling.  Gastrointestinal:  Negative for blood in stool, constipation and diarrhea.  Genitourinary:  Negative for dysuria, frequency and urgency.  Musculoskeletal:  Negative for back pain, gait problem, joint swelling, myalgias, neck pain, neck stiffness and stiffness.       Left wrist pain  Skin:  Negative for itching and rash.  Neurological:  Positive for tingling. Negative for dizziness, weakness, numbness and headaches.  Hematological:  Does not bruise/bleed easily.  Psychiatric/Behavioral:  Negative for agitation, behavioral problems, dysphoric mood, self-injury, sleep disturbance and suicidal ideas. The patient is not nervous/anxious.        Objective:  BP 138/77 (BP Location: Right Arm, Patient Position: Sitting, Cuff Size: Normal)   Pulse 77   Temp (!) 97.1 F (36.2 C) (Temporal)   Ht $R'5\' 7"'ey$  (1.702 m)   Wt 219 lb 12.8 oz (99.7 kg)   LMP 08/13/2013 Comment: negative urine pregnancy test 12-2013  SpO2 98%   BMI 34.43 kg/m     BP Readings from Last 3 Encounters:  04/10/22 138/77  02/08/22 118/76  09/22/21 112/86   Wt Readings from Last 3 Encounters:  04/10/22 219 lb 12.8 oz (99.7 kg)  02/08/22 218 lb 12.8 oz (99.2 kg)  09/22/21 218 lb 6.4 oz (99.1 kg)   Physical Exam Vitals reviewed.  Constitutional:       General: She is not in acute distress.    Appearance: She is well-developed. She is obese.  HENT:     Right Ear: Tympanic membrane, ear canal and external ear normal.     Left Ear: Tympanic membrane, ear canal and external ear normal.     Nose: Nose normal.  Eyes:     Extraocular Movements: Extraocular movements intact.     Conjunctiva/sclera: Conjunctivae normal.  Cardiovascular:     Rate and Rhythm: Normal rate and regular rhythm.     Pulses: Normal pulses.     Heart sounds: Normal heart sounds.  Pulmonary:     Effort: Pulmonary effort is normal. No respiratory distress.     Breath sounds: Normal breath sounds.  Chest:     Chest wall: No tenderness.  Abdominal:     General: Bowel sounds are normal.     Palpations: Abdomen is soft.  Genitourinary:    Comments: Deferred breast and pelvic exam to GYN Musculoskeletal:        General: Normal range of motion.     Right shoulder: Normal.     Left shoulder: Normal.     Right upper arm: Normal.     Left upper arm: Normal.     Right elbow: Normal.     Left elbow: Normal.     Right forearm: Normal.     Left forearm: Normal.     Right wrist: Normal.     Left wrist: Tenderness present. No effusion, snuff box tenderness or crepitus. Normal range of motion. Normal pulse.     Right hand: Normal.     Left hand: Normal.     Cervical back: Normal range of motion and neck supple.     Right lower leg: No edema.     Left lower leg: No edema.  Lymphadenopathy:     Cervical: No cervical adenopathy.  Skin:    General: Skin is warm and dry.  Neurological:     Mental Status: She is alert and oriented  to person, place, and time.     Deep Tendon Reflexes: Reflexes are normal and symmetric.  Psychiatric:        Mood and Affect: Mood normal.        Behavior: Behavior normal.        Thought Content: Thought content normal.     Results for orders placed or performed in visit on 04/10/22  TSH  Result Value Ref Range   TSH 1.030 0.450 - 4.500  uIU/mL  Hepatitis C Antibody  Result Value Ref Range   Hep C Virus Ab Non Reactive Non Reactive  Comprehensive metabolic panel  Result Value Ref Range   Glucose 99 70 - 99 mg/dL   BUN 16 6 - 24 mg/dL   Creatinine, Ser 0.88 0.57 - 1.00 mg/dL   eGFR 79 >59 mL/min/1.73   BUN/Creatinine Ratio 18 9 - 23   Sodium 141 134 - 144 mmol/L   Potassium 4.5 3.5 - 5.2 mmol/L   Chloride 105 96 - 106 mmol/L   CO2 25 20 - 29 mmol/L   Calcium 9.4 8.7 - 10.2 mg/dL   Total Protein 6.4 6.0 - 8.5 g/dL   Albumin 4.0 3.8 - 4.9 g/dL   Globulin, Total 2.4 1.5 - 4.5 g/dL   Albumin/Globulin Ratio 1.7 1.2 - 2.2   Bilirubin Total 0.6 0.0 - 1.2 mg/dL   Alkaline Phosphatase 86 44 - 121 IU/L   AST 17 0 - 40 IU/L   ALT 26 0 - 32 IU/L  CBC  Result Value Ref Range   WBC 5.6 3.4 - 10.8 x10E3/uL   RBC 4.51 3.77 - 5.28 x10E6/uL   Hemoglobin 13.5 11.1 - 15.9 g/dL   Hematocrit 40.3 34.0 - 46.6 %   MCV 89 79 - 97 fL   MCH 29.9 26.6 - 33.0 pg   MCHC 33.5 31.5 - 35.7 g/dL   RDW 12.5 11.7 - 15.4 %   Platelets 261 150 - 450 x10E3/uL  Lipid panel  Result Value Ref Range   Cholesterol, Total 180 100 - 199 mg/dL   Triglycerides 95 0 - 149 mg/dL   HDL 52 >39 mg/dL   VLDL Cholesterol Cal 17 5 - 40 mg/dL   LDL Chol Calc (NIH) 111 (H) 0 - 99 mg/dL   Chol/HDL Ratio 3.5 0.0 - 4.4 ratio  Hemoglobin A1c  Result Value Ref Range   Hgb A1c MFr Bld 5.9 (H) 4.8 - 5.6 %   Est. average glucose Bld gHb Est-mCnc 123 mg/dL  VITAMIN D 25 Hydroxy (Vit-D Deficiency, Fractures)  Result Value Ref Range   Vit D, 25-Hydroxy 20.3 (L) 30.0 - 100.0 ng/mL      Assessment & Plan:    Routine Health Maintenance and Physical Exam  Immunization History  Administered Date(s) Administered   Influenza Split 10/18/2011, 09/15/2016   Influenza, Seasonal, Injecte, Preservative Fre 09/15/2016   Influenza,inj,Quad PF,6+ Mos 09/22/2021   Influenza,inj,Quad PF,6-35 Mos 09/21/2020   Influenza-Unspecified 08/22/2017, 09/24/2019   PFIZER  Comirnaty(Gray Top)Covid-19 Tri-Sucrose Vaccine 04/02/2020, 07/02/2020   Tdap 02/27/2013   Zoster Recombinat (Shingrix) 04/10/2022    Health Maintenance  Topic Date Due   PAP SMEAR-Modifier  05/12/2022   COVID-19 Vaccine (3 - Booster for Pfizer series) 04/26/2022 (Originally 08/27/2020)   Zoster Vaccines- Shingrix (2 of 2) 06/05/2022   INFLUENZA VACCINE  06/27/2022   TETANUS/TDAP  02/28/2023   MAMMOGRAM  12/29/2023   COLONOSCOPY (Pts 45-64yrs Insurance coverage will need to be confirmed)  01/19/2028   Hepatitis C Screening  Completed   HPV VACCINES  Aged Out   HIV Screening  Discontinued   Discussed health benefits of physical activity, and encouraged her to engage in regular exercise appropriate for her age and condition.  Problem List Items Addressed This Visit       Digestive   Cholelithiasis    No acute exacerbation in last 34months.         Other   Depression, major, single episode, complete remission ([place])    Stable with cymbalta Refill sent       Relevant Medications   DULoxetine (CYMBALTA) 60 MG capsule   Other Relevant Orders   TSH   Mixed hyperlipidemia   Relevant Orders   Lipid panel   Lipid panel (Completed)   Obesity    Reports difficulty with weight loss due to exercise inconsistency and craving for high sugar foods. Weight loss attempts: 34yrs agoTried phentermine and walking for exercise x 72months, unknown amount of weight loss, 10yrs ago. Stopped phentermine due to palpitations. 66yrs ago Tried keto diet x 74months, lost 10lbs. She wants to try wegovy injection. She is aware of possible side effects and med contraindications.  Advised about the importance of incorporating heart healthy diet, decrease daily calories and daily exercise, in combination with GLP-1 injection. Provided printed information. Sent wegovy 0.$RemoveBefor'25mg'qnhuiXSXqALU$  to 0.$Rem'5mg'bTJF$  weekly. F/up in 6weeks. Check hgbA1c, CMP, TSH, lipid panel.       Relevant Medications   Semaglutide-Weight Management  (WEGOVY) 0.25 MG/0.5ML SOAJ   Other Relevant Orders   TSH (Completed)   Prediabetes    Repeat hgbA1c: stable at 5.9%       Relevant Orders   Hemoglobin A1c   Hemoglobin A1c (Completed)   Vitamin D deficiency    Repeat Vit. D: low Sent weekly 50000IU x 12weeks       Relevant Medications   Vitamin D, Ergocalciferol, (DRISDOL) 1.25 MG (50000 UNIT) CAPS capsule   Other Relevant Orders   VITAMIN D 25 Hydroxy (Vit-D Deficiency, Fractures)   VITAMIN D 25 Hydroxy (Vit-D Deficiency, Fractures) (Completed)   Other Visit Diagnoses     Encounter for preventative adult health care exam with abnormal findings    -  Primary   Relevant Orders   Comprehensive metabolic panel   CBC   Comprehensive metabolic panel (Completed)   CBC (Completed)   Encounter for hepatitis C screening test for low risk patient       Relevant Orders   Hepatitis C Antibody (Completed)   Hepatitis C antibody   Need for shingles vaccine       Relevant Orders   Varicella-zoster vaccine IM (Shingrix) (Completed)   Recurrent major depressive disorder, in full remission ([place])       Relevant Medications   DULoxetine (CYMBALTA) 60 MG capsule      Return in about 6 weeks (around 05/22/2022) for weight management.     Wilfred Lacy, NP

## 2022-04-10 NOTE — Assessment & Plan Note (Signed)
Stable with cymbalta Refill sent 

## 2022-04-10 NOTE — Assessment & Plan Note (Signed)
No acute exacerbation in last 50month. ?

## 2022-04-10 NOTE — Assessment & Plan Note (Signed)
Reports difficulty with weight loss due to exercise inconsistency and craving for high sugar foods. ?Weight loss attempts: 29yr agoTried phentermine and walking for exercise x 336month unknown amount of weight loss, [id number]. Stopped phentermine due to palpitations. ?[id number] Tried keto diet x 6mon28monthst 10lbs. ?She wants to try wegovy injection. She is aware of possible side effects and med contraindications. ? ?Advised about the importance of incorporating heart healthy diet, decrease daily calories and daily exercise, in combination with GLP-1 injection. Provided printed information. ?Sent wegovy 0.'25mg'$  to 0.'5mg'$  weekly. ?F/up in 6weeks. ?Check hgbA1c, CMP, TSH, lipid panel. ?

## 2022-04-12 LAB — LIPID PANEL
Chol/HDL Ratio: 3.5 ratio (ref 0.0–4.4)
Cholesterol, Total: 180 mg/dL (ref 100–199)
HDL: 52 mg/dL (ref >39)
LDL Chol Calc (NIH): 111 mg/dL — ABNORMAL HIGH (ref 0–99)
Triglycerides: 95 mg/dL (ref 0–149)
VLDL Cholesterol Cal: 17 mg/dL (ref 5–40)

## 2022-04-12 LAB — COMPREHENSIVE METABOLIC PANEL
ALT: 26 IU/L (ref 0–32)
AST: 17 IU/L (ref 0–40)
Albumin/Globulin Ratio: 1.7 (ref 1.2–2.2)
Albumin: 4 g/dL (ref 3.8–4.9)
Alkaline Phosphatase: 86 IU/L (ref 44–121)
BUN/Creatinine Ratio: 18 (ref 9–23)
BUN: 16 mg/dL (ref 6–24)
Bilirubin Total: 0.6 mg/dL (ref 0.0–1.2)
CO2: 25 mmol/L (ref 20–29)
Calcium: 9.4 mg/dL (ref 8.7–10.2)
Chloride: 105 mmol/L (ref 96–106)
Creatinine, Ser: 0.88 mg/dL (ref 0.57–1.00)
Globulin, Total: 2.4 g/dL (ref 1.5–4.5)
Glucose: 99 mg/dL (ref 70–99)
Potassium: 4.5 mmol/L (ref 3.5–5.2)
Sodium: 141 mmol/L (ref 134–144)
Total Protein: 6.4 g/dL (ref 6.0–8.5)
eGFR: 79 mL/min/{1.73_m2} (ref >59)

## 2022-04-12 LAB — CBC
Hematocrit: 40.3 % (ref 34.0–46.6)
Hemoglobin: 13.5 g/dL (ref 11.1–15.9)
MCH: 29.9 pg (ref 26.6–33.0)
MCHC: 33.5 g/dL (ref 31.5–35.7)
MCV: 89 fL (ref 79–97)
Platelets: 261 10*3/uL (ref 150–450)
RBC: 4.51 x10E6/uL (ref 3.77–5.28)
RDW: 12.5 % (ref 11.7–15.4)
WBC: 5.6 10*3/uL (ref 3.4–10.8)

## 2022-04-12 LAB — HEMOGLOBIN A1C
Est. average glucose Bld gHb Est-mCnc: 123 mg/dL
Hgb A1c MFr Bld: 5.9 % — ABNORMAL HIGH (ref 4.8–5.6)

## 2022-04-12 LAB — HEPATITIS C ANTIBODY: Hep C Virus Ab: NONREACTIVE

## 2022-04-12 LAB — VITAMIN D 25 HYDROXY (VIT D DEFICIENCY, FRACTURES): Vit D, 25-Hydroxy: 20.3 ng/mL — ABNORMAL LOW (ref 30.0–100.0)

## 2022-04-12 LAB — TSH: TSH: 1.03 u[IU]/mL (ref 0.450–4.500)

## 2022-04-13 ENCOUNTER — Encounter: Payer: Self-pay | Admitting: Nurse Practitioner

## 2022-04-13 ENCOUNTER — Other Ambulatory Visit (HOSPITAL_COMMUNITY): Payer: Self-pay

## 2022-04-14 ENCOUNTER — Other Ambulatory Visit (HOSPITAL_COMMUNITY): Payer: Self-pay

## 2022-04-14 MED ORDER — VITAMIN D (ERGOCALCIFEROL) 1.25 MG (50000 UNIT) PO CAPS
50000.0000 [IU] | ORAL_CAPSULE | ORAL | 0 refills | Status: DC
  Filled 2022-04-14: qty 12, 84d supply, fill #0

## 2022-05-06 ENCOUNTER — Other Ambulatory Visit (HOSPITAL_COMMUNITY): Payer: Self-pay

## 2022-05-15 ENCOUNTER — Encounter: Payer: Self-pay | Admitting: Nurse Practitioner

## 2022-05-19 ENCOUNTER — Ambulatory Visit: Payer: No Typology Code available for payment source | Admitting: Nurse Practitioner

## 2022-05-24 ENCOUNTER — Other Ambulatory Visit (HOSPITAL_COMMUNITY): Payer: Self-pay

## 2022-06-21 ENCOUNTER — Other Ambulatory Visit (HOSPITAL_COMMUNITY): Payer: Self-pay

## 2022-06-30 ENCOUNTER — Ambulatory Visit: Payer: No Typology Code available for payment source | Admitting: Nurse Practitioner

## 2022-07-31 ENCOUNTER — Other Ambulatory Visit: Payer: Self-pay | Admitting: Nurse Practitioner

## 2022-07-31 ENCOUNTER — Other Ambulatory Visit (HOSPITAL_COMMUNITY): Payer: Self-pay

## 2022-07-31 DIAGNOSIS — E559 Vitamin D deficiency, unspecified: Secondary | ICD-10-CM

## 2022-08-03 ENCOUNTER — Other Ambulatory Visit (HOSPITAL_COMMUNITY): Payer: Self-pay

## 2022-08-14 ENCOUNTER — Ambulatory Visit (INDEPENDENT_AMBULATORY_CARE_PROVIDER_SITE_OTHER): Payer: No Typology Code available for payment source | Admitting: Physician Assistant

## 2022-08-14 ENCOUNTER — Encounter: Payer: Self-pay | Admitting: Physician Assistant

## 2022-08-14 ENCOUNTER — Other Ambulatory Visit (HOSPITAL_COMMUNITY): Payer: Self-pay

## 2022-08-14 ENCOUNTER — Other Ambulatory Visit (HOSPITAL_COMMUNITY)
Admission: RE | Admit: 2022-08-14 | Discharge: 2022-08-14 | Disposition: A | Discharge status: Home / Self Care | Payer: No Typology Code available for payment source | Attending: Physician Assistant | Admitting: Physician Assistant

## 2022-08-14 VITALS — BP 155/89 | HR 91 | Ht 67.0 in | Wt 219.0 lb

## 2022-08-14 DIAGNOSIS — N390 Urinary tract infection, site not specified: Secondary | ICD-10-CM

## 2022-08-14 DIAGNOSIS — N3000 Acute cystitis without hematuria: Secondary | ICD-10-CM | POA: Diagnosis present

## 2022-08-14 DIAGNOSIS — R030 Elevated blood-pressure reading, without diagnosis of hypertension: Secondary | ICD-10-CM

## 2022-08-14 DIAGNOSIS — B951 Streptococcus, group B, as the cause of diseases classified elsewhere: Secondary | ICD-10-CM

## 2022-08-14 LAB — POCT URINALYSIS DIP (CLINITEK)
Bilirubin, UA: NEGATIVE
Glucose, UA: NEGATIVE mg/dL
Ketones, POC UA: NEGATIVE mg/dL
Nitrite, UA: NEGATIVE
POC PROTEIN,UA: NEGATIVE
Spec Grav, UA: 1.02 (ref 1.010–1.025)
Urobilinogen, UA: 0.2 E.U./dL
pH, UA: 5.5 (ref 5.0–8.0)

## 2022-08-14 MED ORDER — SULFAMETHOXAZOLE-TRIMETHOPRIM 800-160 MG PO TABS
1.0000 | ORAL_TABLET | Freq: Two times a day (BID) | ORAL | 0 refills | Status: AC
  Filled 2022-08-14: qty 10, 5d supply, fill #0

## 2022-08-14 NOTE — Patient Instructions (Signed)
You are going to take Bactrim twice daily for 5 days.  I encourage you to increase your water intake, you should be drinking at least 64 ounces of water a day.  Make sure to get plenty of rest.  Your blood pressure is elevated today, please check your blood pressure at home, keep a written log and have available for all office visits.  We will call you with your lab results when they are available.  Kennieth Rad, PA-C Physician Assistant [facility] [email or web address]   Urinary Tract Infection, Adult  A urinary tract infection (UTI) is an infection of any part of the urinary tract. The urinary tract includes the kidneys, ureters, bladder, and urethra. These organs make, store, and get rid of urine in the body. An upper UTI affects the ureters and kidneys. A lower UTI affects the bladder and urethra. What are the causes? Most urinary tract infections are caused by bacteria in your genital area around your urethra, where urine leaves your body. These bacteria grow and cause inflammation of your urinary tract. What increases the risk? You are more likely to develop this condition if: You have a urinary catheter that stays in place. You are not able to control when you urinate or have a bowel movement (incontinence). You are female and you: Use a spermicide or diaphragm for birth control. Have low estrogen levels. Are pregnant. You have certain genes that increase your risk. You are sexually active. You take antibiotic medicines. You have a condition that causes your flow of urine to slow down, such as: An enlarged prostate, if you are female. Blockage in your urethra. A kidney stone. A nerve condition that affects your bladder control (neurogenic bladder). Not getting enough to drink, or not urinating often. You have certain medical conditions, such as: Diabetes. A weak disease-fighting system (immunesystem). Sickle cell  disease. Gout. Spinal cord injury. What are the signs or symptoms? Symptoms of this condition include: Needing to urinate right away (urgency). Frequent urination. This may include small amounts of urine each time you urinate. Pain or burning with urination. Blood in the urine. Urine that smells bad or unusual. Trouble urinating. Cloudy urine. Vaginal discharge, if you are female. Pain in the abdomen or the lower back. You may also have: Vomiting or a decreased appetite. Confusion. Irritability or tiredness. A fever or chills. Diarrhea. The first symptom in older adults may be confusion. In some cases, they may not have any symptoms until the infection has worsened. How is this diagnosed? This condition is diagnosed based on your medical history and a physical exam. You may also have other tests, including: Urine tests. Blood tests. Tests for STIs (sexually transmitted infections). If you have had more than one UTI, a cystoscopy or imaging studies may be done to determine the cause of the infections. How is this treated? Treatment for this condition includes: Antibiotic medicine. Over-the-counter medicines to treat discomfort. Drinking enough water to stay hydrated. If you have frequent infections or have other conditions such as a kidney stone, you may need to see a health care provider who specializes in the urinary tract (urologist). In rare cases, urinary tract infections can cause sepsis. Sepsis is a life-threatening condition that occurs when the body responds to an infection. Sepsis is treated in the hospital with IV antibiotics, fluids, and other medicines. Follow these instructions at home:  Medicines Take over-the-counter and prescription medicines only as told by your health care provider. If you were prescribed an antibiotic  medicine, take it as told by your health care provider. Do not stop using the antibiotic even if you start to feel better. General  instructions Make sure you: Empty your bladder often and completely. Do not hold urine for long periods of time. Empty your bladder after sex. Wipe from front to back after urinating or having a bowel movement if you are female. Use each tissue only one time when you wipe. Drink enough fluid to keep your urine pale yellow. Keep all follow-up visits. This is important. Contact a health care provider if: Your symptoms do not get better after 1-2 days. Your symptoms go away and then return. Get help right away if: You have severe pain in your back or your lower abdomen. You have a fever or chills. You have nausea or vomiting. Summary A urinary tract infection (UTI) is an infection of any part of the urinary tract, which includes the kidneys, ureters, bladder, and urethra. Most urinary tract infections are caused by bacteria in your genital area. Treatment for this condition often includes antibiotic medicines. If you were prescribed an antibiotic medicine, take it as told by your health care provider. Do not stop using the antibiotic even if you start to feel better. Keep all follow-up visits. This is important. This information is not intended to replace advice given to you by your health care provider. Make sure you discuss any questions you have with your health care provider. Document Revised: 06/25/2020 Document Reviewed: 06/25/2020 Elsevier Patient Education  [place].  How to Take Your Blood Pressure Blood pressure is a measurement of how strongly your blood is pressing against the walls of your arteries. Arteries are blood vessels that carry blood from your heart throughout your body. Your health care provider takes your blood pressure at each office visit. You can also take your own blood pressure at home with a blood pressure monitor. You may need to take your own blood pressure to: Confirm a diagnosis of high blood pressure (hypertension). Monitor your blood pressure  over time. Make sure your blood pressure medicine is working. Supplies needed: Blood pressure monitor. A chair to sit in. This should be a chair where you can sit upright with your back supported. Do not sit on a soft couch or an armchair. Table or desk. Small notebook and pencil or pen. How to prepare To get the most accurate reading, avoid the following for 30 minutes before you check your blood pressure: Drinking caffeine. Drinking alcohol. Eating. Smoking. Exercising. Five minutes before you check your blood pressure: Use the bathroom and urinate so that you have an empty bladder. Sit quietly in a chair. Do not talk. How to take your blood pressure To check your blood pressure, follow the instructions in the manual that came with your blood pressure monitor. If you have a digital blood pressure monitor, the instructions may be as follows: Sit up straight in a chair. Place your feet on the floor. Do not cross your ankles or legs. Rest your left arm at the level of your heart on a table or desk or on the arm of a chair. Pull up your shirt sleeve. Wrap the blood pressure cuff around the upper part of your left arm, 1 inch (2.5 cm) above your elbow. It is best to wrap the cuff around bare skin. Fit the cuff snugly, but not too tightly, around your arm. You should be able to place only one finger between the cuff and your arm. Position the  cord so that it rests in the bend of your elbow. Press the power button. Sit quietly while the cuff inflates and deflates. Read the digital reading on the monitor screen and write the numbers down (record them) in a notebook. Wait 2-3 minutes, then repeat the steps, starting at step 1. What does my blood pressure reading mean? A blood pressure reading consists of a higher number over a lower number. Ideally, your blood pressure should be below 120/80. The first ("top") number is called the systolic pressure. It is a measure of the pressure in your  arteries as your heart beats. The second ("bottom") number is called the diastolic pressure. It is a measure of the pressure in your arteries as the heart relaxes. Blood pressure is classified into four stages. The following are the stages for adults who do not have a short-term serious illness or a chronic condition. Systolic pressure and diastolic pressure are measured in a unit called mm Hg (millimeters of mercury).  Normal Systolic pressure: below 517. Diastolic pressure: below 80. Elevated Systolic pressure: 616-073. Diastolic pressure: below 80. Hypertension stage 1 Systolic pressure: 710-626. Diastolic pressure: 94-85. Hypertension stage 2 Systolic pressure: 462 or above. Diastolic pressure: 90 or above. You can have elevated blood pressure or hypertension even if only the systolic or only the diastolic number in your reading is higher than normal. Follow these instructions at home: Medicines Take over-the-counter and prescription medicines only as told by your health care provider. Tell your health care provider if you are having any side effects from blood pressure medicine. General instructions Check your blood pressure as often as recommended by your health care provider. Check your blood pressure at the same time every day. Take your monitor to the next appointment with your health care provider to make sure that: You are using it correctly. It provides accurate readings. Understand what your goal blood pressure numbers are. Keep all follow-up visits. This is important. General tips Your health care provider can suggest a reliable monitor that will meet your needs. There are several types of home blood pressure monitors. Choose a monitor that has an arm cuff. Do not choose a monitor that measures your blood pressure from your wrist or finger. Choose a cuff that wraps snugly, not too tight or too loose, around your upper arm. You should be able to fit only one finger between  your arm and the cuff. You can buy a blood pressure monitor at most drugstores or online. Where to find more information American Heart Association: www.heart.org Contact a health care provider if: Your blood pressure is consistently high. Your blood pressure is suddenly low. Get help right away if: Your systolic blood pressure is higher than 180. Your diastolic blood pressure is higher than 120. These symptoms may be an emergency. Get help right away. Call 911. Do not wait to see if the symptoms will go away. Do not drive yourself to the hospital. Summary Blood pressure is a measurement of how strongly your blood is pressing against the walls of your arteries. A blood pressure reading consists of a higher number over a lower number. Ideally, your blood pressure should be below 120/80. Check your blood pressure at the same time every day. Avoid caffeine, alcohol, smoking, and exercise for 30 minutes prior to checking your blood pressure. These agents can affect the accuracy of the blood pressure reading. This information is not intended to replace advice given to you by your health care provider. Make sure you discuss  any questions you have with your health care provider. Document Revised: 07/28/2021 Document Reviewed: 07/28/2021 Elsevier Patient Education  [place].

## 2022-08-14 NOTE — Progress Notes (Unsigned)
   Established Patient Office Visit  Subjective   Patient ID: Rebecca [name], female    DOB: Jan 11, 1971  Age: 51 y.o. MRN: [id number]  No chief complaint on file.   States that she has been having lower left back pain, suprapelvic pain, nausea, small amount of odor, for the past three days.  Denies fever, but did have chills.  Eating and drinking okay.  States that she has taken ibuprofen and boric acid suppositories without relief.      {History (Optional):23778}  Review of Systems  Constitutional:  Positive for chills. Negative for fever.  HENT: Negative.    Eyes: Negative.   Respiratory:  Negative for shortness of breath.   Cardiovascular:  Negative for chest pain.  Gastrointestinal:  Positive for abdominal pain and nausea. Negative for constipation, diarrhea and vomiting.  Genitourinary:  Positive for frequency. Negative for dysuria and hematuria.  Musculoskeletal:  Positive for back pain.  Skin: Negative.   Neurological: Negative.   Endo/Heme/Allergies: Negative.   Psychiatric/Behavioral: Negative.        Objective:     BP (!) 155/89 (BP Location: Left Arm, Patient Position: Sitting)   Pulse 91   LMP 08/13/2013 Comment: negative urine pregnancy test 12-2013  SpO2 97%  {Vitals History (Optional):23777}  Physical Exam Vitals and nursing note reviewed.  Constitutional:      Appearance: Normal appearance.  HENT:     Head: Normocephalic and atraumatic.     Right Ear: External ear normal.     Left Ear: External ear normal.     Nose: Nose normal.     Mouth/Throat:     Mouth: Mucous membranes are moist.     Pharynx: Oropharynx is clear.  Cardiovascular:     Rate and Rhythm: Normal rate and regular rhythm.     Pulses: Normal pulses.     Heart sounds: Normal heart sounds.  Pulmonary:     Effort: Pulmonary effort is normal.  Abdominal:     Tenderness: There is abdominal tenderness in the suprapubic area. There is left CVA tenderness.  Musculoskeletal:         General: Normal range of motion.     Cervical back: Normal range of motion and neck supple.  Skin:    General: Skin is warm and dry.  Neurological:     General: No focal deficit present.     Mental Status: She is alert and oriented to person, place, and time.      No results found for any visits on 08/14/22.  {Labs (Optional):23779}  The 10-year ASCVD risk score (Arnett DK, et al., 2019) is: 9.9%    Assessment & Plan:   Problem List Items Addressed This Visit   None   No follow-ups on file.    Loraine Grip Mayers, PA-C

## 2022-08-15 ENCOUNTER — Other Ambulatory Visit (HOSPITAL_COMMUNITY): Payer: Self-pay

## 2022-08-15 ENCOUNTER — Encounter: Payer: Self-pay | Admitting: Physician Assistant

## 2022-08-15 LAB — URINE CULTURE: Culture: 70000 — AB

## 2022-08-15 MED ORDER — CEFDINIR 300 MG PO CAPS
300.0000 mg | ORAL_CAPSULE | Freq: Two times a day (BID) | ORAL | 0 refills | Status: AC
  Filled 2022-08-15: qty 10, 5d supply, fill #0

## 2022-08-15 NOTE — Addendum Note (Signed)
Addended by: Kennieth Rad on: 08/15/2022 12:12 PM   Modules accepted: Orders

## 2022-08-17 NOTE — Progress Notes (Signed)
error 

## 2022-08-21 ENCOUNTER — Ambulatory Visit: Payer: No Typology Code available for payment source | Admitting: Nurse Practitioner

## 2022-08-26 ENCOUNTER — Other Ambulatory Visit (HOSPITAL_COMMUNITY): Payer: Self-pay

## 2022-09-22 ENCOUNTER — Other Ambulatory Visit: Payer: Self-pay

## 2022-09-22 ENCOUNTER — Encounter: Payer: Self-pay | Admitting: Nurse Practitioner

## 2022-09-22 ENCOUNTER — Ambulatory Visit (INDEPENDENT_AMBULATORY_CARE_PROVIDER_SITE_OTHER): Payer: No Typology Code available for payment source | Admitting: Nurse Practitioner

## 2022-09-22 VITALS — BP 128/86 | HR 83 | Temp 97.1°F | Ht 67.0 in | Wt 212.4 lb

## 2022-09-22 DIAGNOSIS — Z23 Encounter for immunization: Secondary | ICD-10-CM | POA: Diagnosis not present

## 2022-09-22 DIAGNOSIS — E559 Vitamin D deficiency, unspecified: Secondary | ICD-10-CM | POA: Diagnosis not present

## 2022-09-22 DIAGNOSIS — F325 Major depressive disorder, single episode, in full remission: Secondary | ICD-10-CM

## 2022-09-22 DIAGNOSIS — R7303 Prediabetes: Secondary | ICD-10-CM

## 2022-09-22 NOTE — Patient Instructions (Addendum)
Go to lab Sign medical release to get records from Dr. Garwin Brothers

## 2022-09-22 NOTE — Assessment & Plan Note (Signed)
Stable mood with cymbalta

## 2022-09-22 NOTE — Assessment & Plan Note (Signed)
Repeat hgbA1c 

## 2022-09-22 NOTE — Progress Notes (Signed)
                Established Patient Visit  Patient: Rebecca [name]   DOB: [birth year]   50 y.o. Female  MRN: [id number] Visit Date: 09/22/2022  Subjective:    Chief Complaint  Patient presents with   Office Visit    Experiencing Anxiety  No other concerns  Flu & shingles given today    HPI Prediabetes Repeat hgbA1c  Depression, major, single episode, complete remission ([place]) Stable mood with cymbalta  Vitamin D deficiency Repeat vit. D  Reviewed medical, surgical, and social history today  Medications: Outpatient Medications Prior to Visit  Medication Sig   DULoxetine (CYMBALTA) 60 MG capsule Take 1 capsule by mouth daily.   [DISCONTINUED] Semaglutide-Weight Management (WEGOVY) 0.25 MG/0.5ML SOAJ Inject 0.25 mg into the skin for 4 weeks then increase to 0.5 mg weekly for 4 weeks (Patient not taking: Reported on 08/14/2022)   [DISCONTINUED] Vitamin D, Ergocalciferol, (DRISDOL) 1.25 MG (50000 UNIT) CAPS capsule Take 1 capsule (50,000 Units total) by mouth every 7 (seven) days. (Patient not taking: Reported on 08/14/2022)   No facility-administered medications prior to visit.   Reviewed past medical and social history.   ROS per HPI above      Objective:  BP 128/86 (BP Location: Right Arm, Patient Position: Sitting, Cuff Size: Normal)   Pulse 83   Temp (!) 97.1 F (36.2 C) (Temporal)   Ht '5\' 7"'$  (1.702 m)   Wt 212 lb 6.4 oz (96.3 kg)   LMP 08/13/2013 Comment: negative urine pregnancy test 12-2013  SpO2 98%   BMI 33.27 kg/m      Physical Exam Cardiovascular:     Rate and Rhythm: Normal rate.     Pulses: Normal pulses.  Pulmonary:     Effort: Pulmonary effort is normal.  Neurological:     Mental Status: She is alert and oriented to person, place, and time.  Psychiatric:        Mood and Affect: Mood normal.        Behavior: Behavior normal.        Thought Content: Thought content normal.     No results found for any visits on 09/22/22.    Assessment &  Plan:    Problem List Items Addressed This Visit       Other   Depression, major, single episode, complete remission ([place]) - Primary    Stable mood with cymbalta      Prediabetes    Repeat hgbA1c      Relevant Orders   Hemoglobin A1c   Vitamin D deficiency    Repeat vit. D      Relevant Orders   VITAMIN D 25 Hydroxy (Vit-D Deficiency, Fractures)   Other Visit Diagnoses     Need for zoster vaccination       Relevant Orders   Zoster Recombinant (Shingrix ) (Completed)   Need for immunization against influenza       Relevant Orders   Flu Vaccine QUAD 6+ mos PF IM (Fluarix Quad PF) (Completed)      Return in about 7 months (around 04/11/2023) for CPE (fasting).     Wilfred Lacy, NP

## 2022-09-22 NOTE — Assessment & Plan Note (Signed)
Repeat vit. D °

## 2022-09-23 LAB — VITAMIN D 25 HYDROXY (VIT D DEFICIENCY, FRACTURES): Vit D, 25-Hydroxy: 24 ng/mL — ABNORMAL LOW (ref 30–100)

## 2022-09-23 LAB — HEMOGLOBIN A1C
Hgb A1c MFr Bld: 6.1 % of total Hgb — ABNORMAL HIGH (ref <5.7)
Mean Plasma Glucose: 128 mg/dL
eAG (mmol/L): 7.1 mmol/L

## 2022-09-24 MED ORDER — VITAMIN D (ERGOCALCIFEROL) 1.25 MG (50000 UNIT) PO CAPS
50000.0000 [IU] | ORAL_CAPSULE | ORAL | 1 refills | Status: DC
  Filled 2022-09-24 – 2022-10-18 (×2): qty 12, 84d supply, fill #0
  Filled 2023-01-11 – 2023-04-15 (×3): qty 12, 84d supply, fill #1

## 2022-09-24 NOTE — Addendum Note (Signed)
Addended by: Wilfred Lacy L on: 09/24/2022 12:24 PM   Modules accepted: Orders

## 2022-09-25 ENCOUNTER — Other Ambulatory Visit (HOSPITAL_COMMUNITY): Payer: Self-pay

## 2022-10-03 ENCOUNTER — Other Ambulatory Visit (HOSPITAL_COMMUNITY): Payer: Self-pay

## 2022-10-18 ENCOUNTER — Other Ambulatory Visit (HOSPITAL_COMMUNITY): Payer: Self-pay

## 2022-11-04 ENCOUNTER — Other Ambulatory Visit (HOSPITAL_COMMUNITY): Payer: Self-pay

## 2022-11-04 MED ORDER — AMOXICILLIN 500 MG PO CAPS
500.0000 mg | ORAL_CAPSULE | Freq: Three times a day (TID) | ORAL | 0 refills | Status: DC
  Filled 2022-11-04: qty 30, 10d supply, fill #0

## 2023-01-11 ENCOUNTER — Other Ambulatory Visit: Payer: Self-pay

## 2023-01-19 ENCOUNTER — Other Ambulatory Visit (HOSPITAL_COMMUNITY): Payer: Self-pay

## 2023-01-23 ENCOUNTER — Encounter: Payer: Self-pay | Admitting: Nurse Practitioner

## 2023-01-23 ENCOUNTER — Ambulatory Visit: Payer: 59 | Admitting: Nurse Practitioner

## 2023-01-23 ENCOUNTER — Other Ambulatory Visit (HOSPITAL_COMMUNITY): Payer: Self-pay

## 2023-01-23 VITALS — BP 128/100 | HR 93 | Temp 97.6°F | Ht 66.25 in | Wt 211.2 lb

## 2023-01-23 DIAGNOSIS — I10 Essential (primary) hypertension: Secondary | ICD-10-CM | POA: Diagnosis not present

## 2023-01-23 DIAGNOSIS — R5383 Other fatigue: Secondary | ICD-10-CM | POA: Diagnosis not present

## 2023-01-23 DIAGNOSIS — R7303 Prediabetes: Secondary | ICD-10-CM | POA: Diagnosis not present

## 2023-01-23 DIAGNOSIS — E559 Vitamin D deficiency, unspecified: Secondary | ICD-10-CM

## 2023-01-23 DIAGNOSIS — F332 Major depressive disorder, recurrent severe without psychotic features: Secondary | ICD-10-CM | POA: Insufficient documentation

## 2023-01-23 DIAGNOSIS — F3341 Major depressive disorder, recurrent, in partial remission: Secondary | ICD-10-CM

## 2023-01-23 LAB — CBC
HCT: 40.1 % (ref 36.0–46.0)
Hemoglobin: 13.1 g/dL (ref 12.0–15.0)
MCHC: 32.7 g/dL (ref 30.0–36.0)
MCV: 88.3 fl (ref 78.0–100.0)
Platelets: 289 10*3/uL (ref 150.0–400.0)
RBC: 4.54 Mil/uL (ref 3.87–5.11)
RDW: 13.2 % (ref 11.5–15.5)
WBC: 4.9 10*3/uL (ref 4.0–10.5)

## 2023-01-23 LAB — IBC + FERRITIN
Ferritin: 44.6 ng/mL (ref 10.0–291.0)
Iron: 70 ug/dL (ref 42–145)
Saturation Ratios: 22 % (ref 20.0–50.0)
TIBC: 317.8 ug/dL (ref 250.0–450.0)
Transferrin: 227 mg/dL (ref 212.0–360.0)

## 2023-01-23 LAB — BASIC METABOLIC PANEL
BUN: 13 mg/dL (ref 6–23)
CO2: 30 mEq/L (ref 19–32)
Calcium: 9.8 mg/dL (ref 8.4–10.5)
Chloride: 103 mEq/L (ref 96–112)
Creatinine, Ser: 0.8 mg/dL (ref 0.40–1.20)
GFR: 83.87 mL/min (ref >60.00)
Glucose, Bld: 73 mg/dL (ref 70–99)
Potassium: 4.7 mEq/L (ref 3.5–5.1)
Sodium: 139 mEq/L (ref 135–145)

## 2023-01-23 LAB — POCT GLYCOSYLATED HEMOGLOBIN (HGB A1C): Hemoglobin A1C: 5.8 % — AB (ref 4.0–5.6)

## 2023-01-23 LAB — VITAMIN D 25 HYDROXY (VIT D DEFICIENCY, FRACTURES): VITD: 31.08 ng/mL (ref 30.00–100.00)

## 2023-01-23 LAB — TSH: TSH: 0.61 u[IU]/mL (ref 0.35–5.50)

## 2023-01-23 MED ORDER — AMLODIPINE BESYLATE 5 MG PO TABS
5.0000 mg | ORAL_TABLET | Freq: Every day | ORAL | 5 refills | Status: DC
  Filled 2023-01-23: qty 30, 30d supply, fill #0

## 2023-01-23 NOTE — Assessment & Plan Note (Addendum)
Chronic, but worse in last 2weeks. Sleep: bedtime 10-11pm, wakes up again at 2am ech night, sometimes able to resume sleep. Reports restlessness at bedtime. Wakes up at 6:45am for work. She has late bedtime due to grand daughter's school sports schedule. No OTC sleep aid used due to fear of daytime drowsiness.  Witnessed snoring but no apnea per husband. Epsworth's score: 8 (average sleepiness). 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  Possibly due to med side effect (cymbalta) vs vit D/iron deficiency vs TSH dysfunction vs lack of adequate sleep vs OSA. Advised about correlation between chronic insomnia with HTN, DM, heart disease, dementia etc She agreed to allow her husband to help grand daughter's sport schedule 2-3x/week, so she can improve bedtime routine. Consider use of melatonin 5-'10mg'$  prn if able to get 8hrs of sleep Check TSH, CBC, iron panel, and vit. D Consider another referral to neurology if normal labs.

## 2023-01-23 NOTE — Progress Notes (Signed)
Established Patient Visit  Patient: Rebecca [name]   DOB: Aug 06, 1971   52 y.o. Female  MRN: [id number] Visit Date: 01/23/2023  Subjective:    Chief Complaint  Patient presents with   Fatigue   Hemoglobin A1c Screening    POC A1c done   HPI  Essential hypertension Previous use of metoprolol. D/c by patient in past. BP Readings from Last 3 Encounters:  01/23/23 (!) 128/100  09/22/22 128/86  08/14/22 (!) 155/89    Start amlodipine '5mg'$  in pm F/up in 24month Vitamin D deficiency Repeat vit. D Has completed high dose x 12wks  Prediabetes Repeat hgbA1c at 5.8%: improved  Other fatigue Chronic, but worse in last 2weeks. Sleep: bedtime 10-11pm, wakes up again at 2am ech night, sometimes able to resume sleep. Reports restlessness at bedtime. Wakes up at 6:45am for work. She has late bedtime due to grand daughter's school sports schedule. No OTC sleep aid used due to fear of daytime drowsiness.  Witnessed snoring but no apnea per husband. Epsworth's score: 8 (average sleepiness). 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  Possibly due to med side effect (cymbalta) vs vit D/iron deficiency vs TSH dysfunction vs lack of adequate sleep vs OSA. Advised about correlation between chronic insomnia with HTN, DM, heart disease, dementia etc She agreed to allow her husband to help grand daughter's sport schedule 2-3x/week, so she can improve bedtime routine. Consider use of melatonin 5-'10mg'$  prn if able to get 8hrs of sleep Check TSH, CBC, iron panel, and vit. D Consider another referral to neurology if normal labs.  Reviewed medical, surgical, and social history today  Medications: Outpatient Medications Prior to Visit  Medication Sig   DULoxetine (CYMBALTA) 60 MG capsule Take 1 capsule by mouth daily.   Vitamin D, Ergocalciferol, (DRISDOL) 1.25 MG (50000 UNIT) CAPS capsule Take 1 capsule (50,000 Units total) by mouth every 7 (seven)  days.   [DISCONTINUED] amoxicillin (AMOXIL) 500 MG capsule Take 1 capsule by mouth 3 times daily until gone.   No facility-administered medications prior to visit.   Reviewed past medical and social history.   ROS per HPI above      Objective:  BP (!) 128/100   Pulse 93   Temp 97.6 F (36.4 C) (Temporal)   Ht 5' 6.25" (1.683 m)   Wt 211 lb 3.2 oz (95.8 kg)   LMP 08/13/2013 Comment: negative urine pregnancy test 12-2013  SpO2 97%   BMI 33.83 kg/m      Physical Exam Cardiovascular:     Rate and Rhythm: Normal rate and regular rhythm.     Pulses: Normal pulses.     Heart sounds: Normal heart sounds.  Pulmonary:     Effort: Pulmonary effort is normal.     Breath sounds: Normal breath sounds.  Neurological:     Mental Status: She is alert and oriented to person, place, and time.     Results for orders placed or performed in visit on 01/23/23  POCT glycosylated hemoglobin (Hb A1C)  Result Value Ref Range   Hemoglobin A1C 5.8 (A) 4.0 - 5.6 %   HbA1c POC (<> result, manual entry)     HbA1c, POC (prediabetic range)     HbA1c, POC (controlled diabetic range)        Assessment & Plan:    Problem List Items Addressed This Visit  Cardiovascular and Mediastinum   Essential hypertension - Primary    Previous use of metoprolol. D/c by patient in past. BP Readings from Last 3 Encounters:  01/23/23 (!) 128/100  09/22/22 128/86  08/14/22 (!) 155/89    Start amlodipine '5mg'$  in pm F/up in 73month     Relevant Medications   amLODipine (NORVASC) 5 MG tablet     Other   Other fatigue    Chronic, but worse in last 2weeks. Sleep: bedtime 10-11pm, wakes up again at 2am ech night, sometimes able to resume sleep. Reports restlessness at bedtime. Wakes up at 6:45am for work. She has late bedtime due to grand daughter's school sports schedule. No OTC sleep aid used due to fear of daytime drowsiness.  Witnessed snoring but no apnea per husband. Epsworth's score: 8 (average  sleepiness). 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  Possibly due to med side effect (cymbalta) vs vit D/iron deficiency vs TSH dysfunction vs lack of adequate sleep vs OSA. Advised about correlation between chronic insomnia with HTN, DM, heart disease, dementia etc She agreed to allow her husband to help grand daughter's sport schedule 2-3x/week, so she can improve bedtime routine. Consider use of melatonin 5-'10mg'$  prn if able to get 8hrs of sleep Check TSH, CBC, iron panel, and vit. D Consider another referral to neurology if normal labs.      Relevant Orders   TSH   Basic metabolic panel   CBC   IBC + Ferritin   Prediabetes    Repeat hgbA1c at 5.8%: improved      Relevant Orders   POCT glycosylated hemoglobin (Hb A1C) (Completed)   RESOLVED: Severe episode of recurrent major depressive disorder, without psychotic features [place]   Vitamin D deficiency    Repeat vit. D Has completed high dose x 12wks      Relevant Orders   VITAMIN D 25 Hydroxy (Vit-D Deficiency, Fractures)   Return in about 4 weeks (around 02/20/2023) for HTN.     CWilfred Lacy NP

## 2023-01-23 NOTE — Assessment & Plan Note (Signed)
Repeat hgbA1c at 5.8%: improved

## 2023-01-23 NOTE — Assessment & Plan Note (Signed)
Previous use of metoprolol. D/c by patient in past. BP Readings from Last 3 Encounters:  01/23/23 (!) 128/100  09/22/22 128/86  08/14/22 (!) 155/89    Start amlodipine '5mg'$  in pm F/up in 64month

## 2023-01-23 NOTE — Patient Instructions (Signed)
Go to lab Start amlodipine at bedtime Maintain low sodium diet  Insomnia Insomnia is a sleep disorder that makes it difficult to fall asleep or stay asleep. Insomnia can cause fatigue, low energy, difficulty concentrating, mood swings, and poor performance at work or school. There are three different ways to classify insomnia: Difficulty falling asleep. Difficulty staying asleep. Waking up too early in the morning. Any type of insomnia can be long-term (chronic) or short-term (acute). Both are common. Short-term insomnia usually lasts for 3 months or less. Chronic insomnia occurs at least three times a week for longer than 3 months. What are the causes? Insomnia may be caused by another condition, situation, or substance, such as: Having certain mental health conditions, such as anxiety and depression. Using caffeine, alcohol, tobacco, or drugs. Having gastrointestinal conditions, such as gastroesophageal reflux disease (GERD). Having certain medical conditions. These include: Asthma. Alzheimer's disease. Stroke. Chronic pain. An overactive thyroid gland (hyperthyroidism). Other sleep disorders, such as restless legs syndrome and sleep apnea. Menopause. Sometimes, the cause of insomnia may not be known. What increases the risk? Risk factors for insomnia include: Gender. Females are affected more often than males. Age. Insomnia is more common as people get older. Stress and certain medical and mental health conditions. Lack of exercise. Having an irregular work schedule. This may include working night shifts and traveling between different time zones. What are the signs or symptoms? If you have insomnia, the main symptom is having trouble falling asleep or having trouble staying asleep. This may lead to other symptoms, such as: Feeling tired or having low energy. Feeling nervous about going to sleep. Not feeling rested in the morning. Having trouble concentrating. Feeling  irritable, anxious, or depressed. How is this diagnosed? This condition may be diagnosed based on: Your symptoms and medical history. Your health care provider may ask about: Your sleep habits. Any medical conditions you have. Your mental health. A physical exam. How is this treated? Treatment for insomnia depends on the cause. Treatment may focus on treating an underlying condition that is causing the insomnia. Treatment may also include: Medicines to help you sleep. Counseling or therapy. Lifestyle adjustments to help you sleep better. Follow these instructions at home: Eating and drinking  Limit or avoid alcohol, caffeinated beverages, and products that contain nicotine and tobacco, especially close to bedtime. These can disrupt your sleep. Do not eat a large meal or eat spicy foods right before bedtime. This can lead to digestive discomfort that can make it hard for you to sleep. Sleep habits  Keep a sleep diary to help you and your health care provider figure out what could be causing your insomnia. Write down: When you sleep. When you wake up during the night. How well you sleep and how rested you feel the next day. Any side effects of medicines you are taking. What you eat and drink. Make your bedroom a dark, comfortable place where it is easy to fall asleep. Put up shades or blackout curtains to block light from outside. Use a white noise machine to block noise. Keep the temperature cool. Limit screen use before bedtime. This includes: Not watching TV. Not using your smartphone, tablet, or computer. Stick to a routine that includes going to bed and waking up at the same times every day and night. This can help you fall asleep faster. Consider making a quiet activity, such as reading, part of your nighttime routine. Try to avoid taking naps during the day so that you sleep better  at night. Get out of bed if you are still awake after 15 minutes of trying to sleep. Keep the  lights down, but try reading or doing a quiet activity. When you feel sleepy, go back to bed. General instructions Take over-the-counter and prescription medicines only as told by your health care provider. Exercise regularly as told by your health care provider. However, avoid exercising in the hours right before bedtime. Use relaxation techniques to manage stress. Ask your health care provider to suggest some techniques that may work well for you. These may include: Breathing exercises. Routines to release muscle tension. Visualizing peaceful scenes. Make sure that you drive carefully. Do not drive if you feel very sleepy. Keep all follow-up visits. This is important. Contact a health care provider if: You are tired throughout the day. You have trouble in your daily routine due to sleepiness. You continue to have sleep problems, or your sleep problems get worse. Get help right away if: You have thoughts about hurting yourself or someone else. Get help right away if you feel like you may hurt yourself or others, or have thoughts about taking your own life. Go to your nearest emergency room or: Call 911. Call the [facility] at [phone] or 988. This is open 24 hours a day. Text the Crisis Text Line at 301-223-2609. Summary Insomnia is a sleep disorder that makes it difficult to fall asleep or stay asleep. Insomnia can be long-term (chronic) or short-term (acute). Treatment for insomnia depends on the cause. Treatment may focus on treating an underlying condition that is causing the insomnia. Keep a sleep diary to help you and your health care provider figure out what could be causing your insomnia. This information is not intended to replace advice given to you by your health care provider. Make sure you discuss any questions you have with your health care provider. Document Revised: 10/24/2021 Document Reviewed: 10/24/2021 Elsevier Patient Education  [address].

## 2023-01-23 NOTE — Assessment & Plan Note (Signed)
Repeat vit. D Has completed high dose x 12wks

## 2023-01-24 NOTE — Progress Notes (Signed)
Stable Follow instructions as discussed during office visit.

## 2023-02-07 ENCOUNTER — Other Ambulatory Visit (HOSPITAL_COMMUNITY): Payer: Self-pay

## 2023-02-09 ENCOUNTER — Encounter: Payer: Self-pay | Admitting: Nurse Practitioner

## 2023-02-09 ENCOUNTER — Ambulatory Visit (INDEPENDENT_AMBULATORY_CARE_PROVIDER_SITE_OTHER): Payer: 59 | Admitting: Nurse Practitioner

## 2023-02-09 VITALS — BP 140/86 | HR 97 | Temp 98.0°F | Resp 16 | Ht 66.0 in | Wt 212.2 lb

## 2023-02-09 DIAGNOSIS — I10 Essential (primary) hypertension: Secondary | ICD-10-CM

## 2023-02-09 NOTE — Assessment & Plan Note (Signed)
She admits inconsistent with amlodipine dose. No exercise and no diet modification BP Readings from Last 3 Encounters:  02/09/23 (!) 140/86  01/23/23 (!) 128/100  09/22/22 128/86    Advised about importance of diet, med and exercise compliance. She agreed to Check BP daily in AM and Send BP readings via mychart on Wednesday Maintain current dose.

## 2023-02-09 NOTE — Patient Instructions (Signed)
Check BP daily in AM Send BP readings via mychart on Wednesday maintain Heart healthy diet and daily exercise. Maintain current dose.

## 2023-02-09 NOTE — Progress Notes (Signed)
                Established Patient Visit  Patient: Rebecca [name]   DOB: [birth year]   52 y.o. Female  MRN: [id number] Visit Date: 02/09/2023  Subjective:    Chief Complaint  Patient presents with   Medical Management of Chronic Issues    HTN    HPI Essential hypertension She admits inconsistent with amlodipine dose. No exercise and no diet modification BP Readings from Last 3 Encounters:  02/09/23 (!) 140/86  01/23/23 (!) 128/100  09/22/22 128/86    Advised about importance of diet, med and exercise compliance. She agreed to Check BP daily in AM and Send BP readings via mychart on Wednesday Maintain current dose.    Reviewed medical, surgical, and social history today  Medications: Outpatient Medications Prior to Visit  Medication Sig   amLODipine (NORVASC) 5 MG tablet Take 1 tablet (5 mg total) by mouth at bedtime.   DULoxetine (CYMBALTA) 60 MG capsule Take 1 capsule by mouth daily.   Vitamin D, Ergocalciferol, (DRISDOL) 1.25 MG (50000 UNIT) CAPS capsule Take 1 capsule (50,000 Units total) by mouth every 7 (seven) days.   No facility-administered medications prior to visit.   Reviewed past medical and social history.   ROS per HPI above      Objective:  BP (!) 140/86   Pulse 97   Temp 98 F (36.7 C) (Temporal)   Resp 16   Ht 5\' 6"  (1.676 m)   Wt 212 lb 3.2 oz (96.3 kg)   LMP 08/13/2013 Comment: negative urine pregnancy test 12-2013  SpO2 97%   BMI 34.25 kg/m      Physical Exam Cardiovascular:     Rate and Rhythm: Normal rate and regular rhythm.     Pulses: Normal pulses.     Heart sounds: Normal heart sounds.  Pulmonary:     Effort: Pulmonary effort is normal.     Breath sounds: Normal breath sounds.  Musculoskeletal:     Right lower leg: No edema.     Left lower leg: No edema.  Neurological:     Mental Status: She is alert and oriented to person, place, and time.     No results found for any visits on 02/09/23.    Assessment & Plan:     Problem List Items Addressed This Visit       Cardiovascular and Mediastinum   Essential hypertension - Primary    She admits inconsistent with amlodipine dose. No exercise and no diet modification BP Readings from Last 3 Encounters:  02/09/23 (!) 140/86  01/23/23 (!) 128/100  09/22/22 128/86    Advised about importance of diet, med and exercise compliance. She agreed to Check BP daily in AM and Send BP readings via mychart on Wednesday Maintain current dose.       Return in about 3 months (around 05/12/2023) for HTN, prediabetes, hyperlipidemia (fasting).     Wilfred Lacy, NP

## 2023-02-15 DIAGNOSIS — I10 Essential (primary) hypertension: Secondary | ICD-10-CM

## 2023-02-16 ENCOUNTER — Other Ambulatory Visit (HOSPITAL_COMMUNITY): Payer: Self-pay

## 2023-02-16 MED ORDER — AMLODIPINE BESYLATE 10 MG PO TABS
10.0000 mg | ORAL_TABLET | Freq: Every day | ORAL | 1 refills | Status: DC
  Filled 2023-02-16: qty 90, 90d supply, fill #0

## 2023-02-20 ENCOUNTER — Other Ambulatory Visit (HOSPITAL_COMMUNITY): Payer: Self-pay

## 2023-02-20 MED ORDER — AMLODIPINE BESYLATE 5 MG PO TABS
5.0000 mg | ORAL_TABLET | Freq: Every day | ORAL | 1 refills | Status: DC
  Filled 2023-02-20: qty 90, 90d supply, fill #0

## 2023-02-20 MED ORDER — TRIAMTERENE-HCTZ 37.5-25 MG PO TABS
0.5000 | ORAL_TABLET | Freq: Every day | ORAL | 0 refills | Status: DC
  Filled 2023-02-20: qty 45, 90d supply, fill #0

## 2023-02-20 NOTE — Addendum Note (Signed)
Addended by: Leana Gamer on: 02/20/2023 04:28 PM   Modules accepted: Orders

## 2023-02-21 ENCOUNTER — Ambulatory Visit: Payer: 59 | Admitting: Nurse Practitioner

## 2023-02-21 DIAGNOSIS — I10 Essential (primary) hypertension: Secondary | ICD-10-CM | POA: Diagnosis not present

## 2023-02-21 DIAGNOSIS — F32A Depression, unspecified: Secondary | ICD-10-CM | POA: Diagnosis not present

## 2023-02-21 DIAGNOSIS — Z23 Encounter for immunization: Secondary | ICD-10-CM | POA: Diagnosis not present

## 2023-02-21 DIAGNOSIS — Z01419 Encounter for gynecological examination (general) (routine) without abnormal findings: Secondary | ICD-10-CM | POA: Diagnosis not present

## 2023-02-21 DIAGNOSIS — E785 Hyperlipidemia, unspecified: Secondary | ICD-10-CM | POA: Diagnosis not present

## 2023-02-21 DIAGNOSIS — R7303 Prediabetes: Secondary | ICD-10-CM | POA: Diagnosis not present

## 2023-02-21 LAB — RESULTS CONSOLE HPV: CHL HPV: NEGATIVE

## 2023-02-21 LAB — HM PAP SMEAR: HM Pap smear: NEGATIVE

## 2023-03-06 ENCOUNTER — Other Ambulatory Visit: Payer: Self-pay | Admitting: Pharmacist

## 2023-03-06 NOTE — Progress Notes (Signed)
Patient outreached by Michiel Cowboy, PharmD Candidate on 03/06/2023 to discuss hypertension.   Patient has an automated home blood pressure machine. They report home readings mostly ~130s/80s.   Medication review was performed. They are not taking medications as prescribed. Differences from their prescribed list include: patient has not yet picked up new Maxzide (0.5 tablets once daily) prescription.   The following barriers to adherence were noted:  - They do not have cost concerns.  - They do not have transportation concerns.  - They do not need assistance obtaining refills.  - They do not occasionally forget to take some of their prescribed medications.  - They do not feel like one/some of their medications make them feel poorly.  - They do not have questions or concerns about their medications.  - They do have follow up scheduled with their primary care provider/cardiologist.   The following interventions were completed:  - Medications were reviewed  - Patient was educated on goal blood pressures and long term health implications of elevated blood pressure  - Patient was educated on proper technique to check home blood pressure and reminded to bring home machine and readings to next provider appointment  - Patient was counseled on lifestyle modifications to improve blood pressure, including avoiding excess salt, caffeine, and processed foods in diet. Also, increasing physical activity to ~150 minutes per week across 3-5 days.   The patient has follow up scheduled:  PCP: 03/12/2023   Michiel Cowboy, PharmD Candidate     Catie Eppie Gibson, PharmD, BCACP, CPP [facility] [phone]

## 2023-03-07 ENCOUNTER — Other Ambulatory Visit: Payer: Self-pay | Admitting: Nurse Practitioner

## 2023-03-07 ENCOUNTER — Other Ambulatory Visit (HOSPITAL_COMMUNITY): Payer: Self-pay

## 2023-03-07 DIAGNOSIS — Z1231 Encounter for screening mammogram for malignant neoplasm of breast: Secondary | ICD-10-CM

## 2023-03-12 ENCOUNTER — Other Ambulatory Visit (HOSPITAL_COMMUNITY): Payer: Self-pay

## 2023-03-12 ENCOUNTER — Ambulatory Visit: Payer: 59 | Admitting: Nurse Practitioner

## 2023-03-12 ENCOUNTER — Encounter: Payer: Self-pay | Admitting: Nurse Practitioner

## 2023-03-12 VITALS — BP 128/86 | HR 98 | Temp 98.3°F | Resp 16 | Ht 66.0 in | Wt 216.4 lb

## 2023-03-12 DIAGNOSIS — I10 Essential (primary) hypertension: Secondary | ICD-10-CM

## 2023-03-12 DIAGNOSIS — E049 Nontoxic goiter, unspecified: Secondary | ICD-10-CM

## 2023-03-12 MED ORDER — AMLODIPINE BESYLATE 5 MG PO TABS
5.0000 mg | ORAL_TABLET | Freq: Every day | ORAL | 1 refills | Status: DC
  Filled 2023-03-12: qty 90, 90d supply, fill #0
  Filled 2023-06-09: qty 90, 90d supply, fill #1

## 2023-03-12 MED ORDER — TRIAMTERENE-HCTZ 37.5-25 MG PO TABS
0.5000 | ORAL_TABLET | Freq: Every day | ORAL | 1 refills | Status: DC
  Filled 2023-03-12: qty 45, 90d supply, fill #0
  Filled 2023-06-09: qty 45, 90d supply, fill #1

## 2023-03-12 NOTE — Assessment & Plan Note (Signed)
Improved BP with amlodipine 5mg  in PM Unable to get maxzide due to schedule conflict. She agreed to switch pharmacy to [facility] so meds can be delivered. BP Readings from Last 3 Encounters:  03/12/23 128/86  02/09/23 (!) 140/86  01/23/23 (!) 128/100    Maintain med doses F/up in 23months

## 2023-03-12 NOTE — Patient Instructions (Signed)
Send copy of recent PAP results. Maintain current medications

## 2023-03-12 NOTE — Progress Notes (Signed)
Established Patient Visit  Patient: Rebecca [name]   DOB: Jul 18, 1971   52 y.o. Female  MRN: [id number] Visit Date: 03/12/2023  Subjective:    Chief Complaint  Patient presents with   Hypertension    Checking bp- readings around 130/80-  Hasn't started the Triamterene -hctz   Hypertension   Essential hypertension Improved BP with amlodipine 5mg  in PM Unable to get maxzide due to schedule conflict. She agreed to switch pharmacy to [facility] so meds can be delivered. BP Readings from Last 3 Encounters:  03/12/23 128/86  02/09/23 (!) 140/86  01/23/23 (!) 128/100    Maintain med doses F/up in 58months   Reviewed medical, surgical, and social history today  Medications: Outpatient Medications Prior to Visit  Medication Sig   DULoxetine (CYMBALTA) 60 MG capsule Take 1 capsule by mouth daily.   Vitamin D, Ergocalciferol, (DRISDOL) 1.25 MG (50000 UNIT) CAPS capsule Take 1 capsule (50,000 Units total) by mouth every 7 (seven) days.   [DISCONTINUED] amLODipine (NORVASC) 5 MG tablet Take 1 tablet (5 mg total) by mouth at bedtime.   [DISCONTINUED] triamterene-hydrochlorothiazide (MAXZIDE-25) 37.5-25 MG tablet Take 1/2 tablet by mouth daily.   No facility-administered medications prior to visit.   Reviewed past medical and social history.   ROS per HPI above  Last metabolic panel Lab Results  Component Value Date   GLUCOSE 73 01/23/2023   NA 139 01/23/2023   K 4.7 01/23/2023   CL 103 01/23/2023   CO2 30 01/23/2023   BUN 13 01/23/2023   CREATININE 0.80 01/23/2023   EGFR 79 04/11/2022   CALCIUM 9.8 01/23/2023   PROT 6.4 04/11/2022   ALBUMIN 4.0 04/11/2022   LABGLOB 2.4 04/11/2022   AGRATIO 1.7 04/11/2022   BILITOT 0.6 04/11/2022   ALKPHOS 86 04/11/2022   AST 17 04/11/2022   ALT 26 04/11/2022   ANIONGAP 7 04/14/2020   Last thyroid functions Lab Results  Component Value Date   TSH 0.61 01/23/2023        Objective:  BP 128/86 (BP Location: Right  Arm, Patient Position: Sitting, Cuff Size: Large)   Pulse 98   Temp 98.3 F (36.8 C) (Temporal)   Resp 16   Ht 5\' 6"  (1.676 m)   Wt 216 lb 6.4 oz (98.2 kg)   LMP 08/13/2013 Comment: negative urine pregnancy test 12-2013  SpO2 98%   BMI 34.93 kg/m      Physical Exam Vitals and nursing note reviewed.  Neck:     Thyroid: Thyromegaly present. No thyroid mass or thyroid tenderness.  Cardiovascular:     Rate and Rhythm: Normal rate.     Pulses: Normal pulses.  Pulmonary:     Effort: Pulmonary effort is normal.  Musculoskeletal:     Cervical back: Normal range of motion and neck supple.     Right lower leg: No edema.     Left lower leg: No edema.  Lymphadenopathy:     Cervical: No cervical adenopathy.  Neurological:     Mental Status: She is alert and oriented to person, place, and time.     No results found for any visits on 03/12/23.    Assessment & Plan:    Problem List Items Addressed This Visit       Cardiovascular and Mediastinum   Essential hypertension - Primary    Improved BP with amlodipine 5mg  in PM Unable to get maxzide due to schedule  conflict. She agreed to switch pharmacy to [facility] so meds can be delivered. BP Readings from Last 3 Encounters:  03/12/23 128/86  02/09/23 (!) 140/86  01/23/23 (!) 128/100    Maintain med doses F/up in 3months      Relevant Medications   amLODipine (NORVASC) 5 MG tablet   triamterene-hydrochlorothiazide (MAXZIDE-25) 37.5-25 MG tablet   Other Visit Diagnoses     Enlarged thyroid       Relevant Orders   US THYROID      Return in about 3 months (around 06/11/2023) for CPE (fasting), HTN.     Alysia Penna, NP

## 2023-04-03 ENCOUNTER — Ambulatory Visit
Admission: RE | Admit: 2023-04-03 | Discharge: 2023-04-03 | Disposition: A | Discharge status: Home / Self Care | Payer: 59 | Source: Ambulatory Visit

## 2023-04-03 DIAGNOSIS — Z1231 Encounter for screening mammogram for malignant neoplasm of breast: Secondary | ICD-10-CM | POA: Diagnosis not present

## 2023-04-15 ENCOUNTER — Other Ambulatory Visit: Payer: Self-pay | Admitting: Nurse Practitioner

## 2023-04-15 DIAGNOSIS — F3342 Major depressive disorder, recurrent, in full remission: Secondary | ICD-10-CM

## 2023-04-16 ENCOUNTER — Other Ambulatory Visit: Payer: Self-pay

## 2023-04-17 ENCOUNTER — Other Ambulatory Visit (HOSPITAL_COMMUNITY): Payer: Self-pay

## 2023-04-17 ENCOUNTER — Other Ambulatory Visit (HOSPITAL_COMMUNITY)
Admission: RE | Admit: 2023-04-17 | Discharge: 2023-04-17 | Disposition: A | Discharge status: Home / Self Care | Payer: 59 | Source: Ambulatory Visit | Attending: Nurse Practitioner | Admitting: Nurse Practitioner

## 2023-04-17 ENCOUNTER — Encounter: Payer: Self-pay | Admitting: Nurse Practitioner

## 2023-04-17 ENCOUNTER — Ambulatory Visit (INDEPENDENT_AMBULATORY_CARE_PROVIDER_SITE_OTHER): Payer: 59 | Admitting: Nurse Practitioner

## 2023-04-17 VITALS — BP 126/84 | HR 95 | Temp 98.0°F | Resp 16 | Ht 66.0 in | Wt 212.4 lb

## 2023-04-17 DIAGNOSIS — E7800 Pure hypercholesterolemia, unspecified: Secondary | ICD-10-CM

## 2023-04-17 DIAGNOSIS — G8929 Other chronic pain: Secondary | ICD-10-CM | POA: Diagnosis not present

## 2023-04-17 DIAGNOSIS — Z23 Encounter for immunization: Secondary | ICD-10-CM

## 2023-04-17 DIAGNOSIS — M25562 Pain in left knee: Secondary | ICD-10-CM | POA: Diagnosis not present

## 2023-04-17 DIAGNOSIS — Z0001 Encounter for general adult medical examination with abnormal findings: Secondary | ICD-10-CM | POA: Diagnosis not present

## 2023-04-17 DIAGNOSIS — F3342 Major depressive disorder, recurrent, in full remission: Secondary | ICD-10-CM | POA: Diagnosis not present

## 2023-04-17 DIAGNOSIS — R8290 Unspecified abnormal findings in urine: Secondary | ICD-10-CM

## 2023-04-17 DIAGNOSIS — N898 Other specified noninflammatory disorders of vagina: Secondary | ICD-10-CM | POA: Insufficient documentation

## 2023-04-17 LAB — LIPID PANEL
Cholesterol: 198 mg/dL (ref 0–200)
HDL: 58.6 mg/dL (ref >39.00)
LDL Cholesterol: 124 mg/dL — ABNORMAL HIGH (ref 0–99)
NonHDL: 139.8
Total CHOL/HDL Ratio: 3
Triglycerides: 80 mg/dL (ref 0.0–149.0)
VLDL: 16 mg/dL (ref 0.0–40.0)

## 2023-04-17 MED ORDER — DULOXETINE HCL 60 MG PO CPEP
60.0000 mg | ORAL_CAPSULE | Freq: Every day | ORAL | 3 refills | Status: DC
Start: 2023-04-17 — End: 2024-05-01
  Filled 2023-04-17: qty 90, 90d supply, fill #0
  Filled 2023-09-18 – 2023-10-08 (×2): qty 90, 90d supply, fill #1
  Filled 2024-02-17 – 2024-04-03 (×2): qty 90, 90d supply, fill #2

## 2023-04-17 NOTE — Progress Notes (Signed)
Complete physical exam  Patient: Rebecca [name]   DOB: Feb 06, 1971   52 y.o. Female  MRN: [id number] Visit Date: 04/17/2023  Subjective:    Chief Complaint  Patient presents with   Annual Exam    Fasting- yes.  Last pap 02/22/23 and Tdap.  Refill of Cymbalta      Rebecca [name] is a 52 y.o. female who presents today for a complete physical exam. She reports consuming a general diet.   Walking 2x/week  She generally feels well. She reports sleeping well. She does not have additional problems to discuss today.  Vision:Yes Dental:No STD Screen:No Last PAP 01/2023: normal per patient BP Readings from Last 3 Encounters:  04/17/23 126/84  03/12/23 128/86  02/09/23 (!) 140/86   Wt Readings from Last 3 Encounters:  04/17/23 212 lb 6.4 oz (96.3 kg)  03/12/23 216 lb 6.4 oz (98.2 kg)  02/09/23 212 lb 3.2 oz (96.3 kg)   Most recent fall risk assessment:    04/17/2023    8:14 AM  Fall Risk   Falls in the past year? 0  Number falls in past yr: 0  Injury with Fall? 0  Risk for fall due to : No Fall Risks  Follow up Falls evaluation completed    Depression screen:Yes - No Depression Most recent depression screenings:    02/09/2023    1:32 PM 01/23/2023   10:36 AM  PHQ 2/9 Scores  PHQ - 2 Score 0 0  PHQ- 9 Score 0     Vaginal Discharge The patient's primary symptoms include a genital odor and vaginal discharge. The patient's pertinent negatives include no genital itching, genital lesions, genital rash, missed menses, pelvic pain or vaginal bleeding. This is a new problem. The current episode started in the past 7 days. The problem occurs constantly. The problem has been unchanged. The patient is experiencing no pain. She is not pregnant. Associated symptoms include constipation. Pertinent negatives include no abdominal pain, anorexia, back pain, chills, diarrhea, discolored urine, dysuria, fever, flank pain, frequency, headaches, hematuria, joint pain, joint swelling, nausea,  painful intercourse, rash, sore throat or urgency. The vaginal discharge was normal. There has been no bleeding. Nothing aggravates the symptoms. The treatment provided no relief. She is sexually active. No, her partner does not have an STD. She is postmenopausal. There is no history of PID, an STD or vaginosis.    Chronic pain of left knee Intermittent Worse with prolonged standing and walking Request for ortho referral  Recurrent major depressive disorder, in full remission (HCC) Stable with cymbalta Refill sent  Past Medical History:  Diagnosis Date   Anemia    Arthritis    Colon polyp 12/25/2011   Tubular adenomatous   Depression    following death of mother   Duodenitis    DVT (deep vein thrombosis) in pregnancy    Esophagitis    Fatigue    Gallstones    GERD (gastroesophageal reflux disease)    Hemorrhagic ovarian cyst 05/22/2012   04/2012    Hiatal hernia    Infection    UTI   Obesity    PUD (peptic ulcer disease)    Sacroiliac pain    Vaginal Pap smear, abnormal    colpo, results ok   Vitamin D deficiency    Past Surgical History:  Procedure Laterality Date   BREAST EXCISIONAL BIOPSY Left    benign   COLONOSCOPY     COLPOSCOPY  2012   NEG   DILATATION &  CURETTAGE/HYSTEROSCOPY WITH MYOSURE N/A 01/17/2017   Procedure: DILATATION & CURETTAGE/HYSTEROSCOPY WITH MYOSURE;  Surgeon: Maxie Better, MD;  Location: [facility];  Service: Gynecology;  Laterality: N/A;   POLYPECTOMY     TUBAL LIGATION     Social History   Socioeconomic History   Marital status: Married    Spouse name: Not on file   Number of children: 2   Years of education: Not on file   Highest education level: Not on file  Occupational History   Occupation: NSNT    Employer: [employer]  Tobacco Use   Smoking status: Former    Packs/day: 0.50    Years: 5.00    Additional pack years: 0.00    Total pack years: 2.50    Types: Cigarettes, E-cigarettes    Quit date: 09/14/2022    Years  since quitting: 0.5   Smokeless tobacco: Never   Tobacco comments:    4 cigs a day   Vaping Use   Vaping Use: Every day   Start date: 08/27/2022  Substance and Sexual Activity   Alcohol use: Yes    Comment: occasional glass of wine   Drug use: No   Sexual activity: Yes    Birth control/protection: Post-menopausal  Other Topics Concern   Not on file  Social History Narrative   Not on file   Social Determinants of Health   Financial Resource Strain: Not on file  Food Insecurity: Not on file  Transportation Needs: Not on file  Physical Activity: Not on file  Stress: Not on file  Social Connections: Not on file  Intimate Partner Violence: Not on file   Family Status  Relation Name Status   Mother Mom Deceased at age 9       brain Aneurysm   Mat Aunt Windell Moulding (Not Specified)   Neg Hx  (Not Specified)   Family History  Problem Relation Age of Onset   Aneurysm Mother    Heart disease Mother    Hypertension Mother    Stroke Mother    Lupus Mother    Diabetes Maternal Aunt    Cancer Maternal Aunt    Colon cancer Neg Hx    Esophageal cancer Neg Hx    Stomach cancer Neg Hx    Rectal cancer Neg Hx    Colon polyps Neg Hx    Allergies  Allergen Reactions   Penicillins Rash    Patient Care Team: Tonica Brasington, Bonna Gains, NP as PCP - General (Internal Medicine) Eugenio Hoes, DDS as Referring Physician (Dental General Practice) Jethro Bolus, MD as Consulting Physician (Ophthalmology) Pyrtle, Carie Caddy, MD as Consulting Physician (Gastroenterology) Maxie Better, MD as Consulting Physician (Obstetrics and Gynecology) Linna Darner, RD as Dietitian (Family Medicine)   Medications: Outpatient Medications Prior to Visit  Medication Sig   amLODipine (NORVASC) 5 MG tablet Take 1 tablet (5 mg total) by mouth at bedtime.   triamterene-hydrochlorothiazide (MAXZIDE-25) 37.5-25 MG tablet Take 1/2 tablet by mouth daily.   Vitamin D, Ergocalciferol, (DRISDOL) 1.25 MG (50000 UNIT)  CAPS capsule Take 1 capsule (50,000 Units total) by mouth every 7 (seven) days.   [DISCONTINUED] DULoxetine (CYMBALTA) 60 MG capsule Take 1 capsule by mouth daily.   No facility-administered medications prior to visit.    Review of Systems  Constitutional:  Negative for activity change, appetite change, chills, fever and unexpected weight change.  HENT:  Negative for sore throat.   Respiratory: Negative.    Cardiovascular: Negative.   Gastrointestinal:  Positive for  constipation. Negative for abdominal pain, anorexia, diarrhea and nausea.  Endocrine: Negative for cold intolerance and heat intolerance.  Genitourinary:  Positive for vaginal discharge. Negative for dysuria, flank pain, frequency, hematuria, missed menses, pelvic pain and urgency.  Musculoskeletal: Negative.  Negative for back pain and joint pain.  Skin: Negative.  Negative for rash.  Neurological: Negative.  Negative for headaches.  Hematological: Negative.   Psychiatric/Behavioral:  Negative for behavioral problems, decreased concentration, dysphoric mood, hallucinations, self-injury, sleep disturbance and suicidal ideas. The patient is not nervous/anxious.    Last CBC Lab Results  Component Value Date   WBC 4.9 01/23/2023   HGB 13.1 01/23/2023   HCT 40.1 01/23/2023   MCV 88.3 01/23/2023   MCH 29.9 04/11/2022   RDW 13.2 01/23/2023   PLT 289.0 01/23/2023   Last metabolic panel Lab Results  Component Value Date   GLUCOSE 73 01/23/2023   NA 139 01/23/2023   K 4.7 01/23/2023   CL 103 01/23/2023   CO2 30 01/23/2023   BUN 13 01/23/2023   CREATININE 0.80 01/23/2023   EGFR 79 04/11/2022   CALCIUM 9.8 01/23/2023   PROT 6.4 04/11/2022   ALBUMIN 4.0 04/11/2022   LABGLOB 2.4 04/11/2022   AGRATIO 1.7 04/11/2022   BILITOT 0.6 04/11/2022   ALKPHOS 86 04/11/2022   AST 17 04/11/2022   ALT 26 04/11/2022   ANIONGAP 7 04/14/2020   Last lipids Lab Results  Component Value Date   CHOL 180 04/11/2022   HDL 52  04/11/2022   LDLCALC 111 (H) 04/11/2022   TRIG 95 04/11/2022   CHOLHDL 3.5 04/11/2022   Last hemoglobin A1c Lab Results  Component Value Date   HGBA1C 5.8 (A) 01/23/2023        Objective:  BP 126/84 (BP Location: Left Arm, Patient Position: Sitting, Cuff Size: Large)   Pulse 95   Temp 98 F (36.7 C) (Temporal)   Resp 16   Ht 5\' 6"  (1.676 m)   Wt 212 lb 6.4 oz (96.3 kg)   LMP 08/13/2013 Comment: negative urine pregnancy test 12-2013  SpO2 98%   BMI 34.28 kg/m     Physical Exam Vitals and nursing note reviewed.  Constitutional:      General: She is not in acute distress. HENT:     Right Ear: Tympanic membrane, ear canal and external ear normal.     Left Ear: Tympanic membrane, ear canal and external ear normal.     Nose: Nose normal.  Eyes:     Extraocular Movements: Extraocular movements intact.     Conjunctiva/sclera: Conjunctivae normal.     Pupils: Pupils are equal, round, and reactive to light.  Neck:     Thyroid: No thyroid mass, thyromegaly or thyroid tenderness.  Cardiovascular:     Rate and Rhythm: Normal rate and regular rhythm.     Pulses: Normal pulses.     Heart sounds: Normal heart sounds.  Pulmonary:     Effort: Pulmonary effort is normal.     Breath sounds: Normal breath sounds.  Abdominal:     General: Bowel sounds are normal.     Palpations: Abdomen is soft.  Genitourinary:    Comments: Breast and pelvic exam deferred to GYN. Opted for self swab today Musculoskeletal:        General: Normal range of motion.     Cervical back: Normal range of motion and neck supple.     Right lower leg: No edema.     Left lower leg: No edema.  Lymphadenopathy:     Cervical: No cervical adenopathy.  Skin:    General: Skin is warm and dry.  Neurological:     Mental Status: She is alert and oriented to person, place, and time.     Cranial Nerves: No cranial nerve deficit.  Psychiatric:        Mood and Affect: Mood normal.        Behavior: Behavior normal.         Thought Content: Thought content normal.     No results found for any visits on 04/17/23.    Assessment & Plan:    Routine Health Maintenance and Physical Exam  Immunization History  Administered Date(s) Administered   Influenza Split 10/18/2011, 09/15/2016   Influenza, Seasonal, Injecte, Preservative Fre 09/15/2016   Influenza,inj,Quad PF,6+ Mos 09/21/2020, 09/22/2021, 09/22/2022   Influenza,inj,Quad PF,6-35 Mos 09/21/2020   Influenza-Unspecified 09/15/2016, 08/22/2017, 09/24/2019   PFIZER Comirnaty(Gray Top)Covid-19 Tri-Sucrose Vaccine 04/02/2020, 07/02/2020   Tdap 02/27/2013, 04/17/2023   Zoster Recombinat (Shingrix) 04/10/2022, 09/22/2022   Health Maintenance  Topic Date Due   PAP SMEAR-Modifier  05/12/2022   COVID-19 Vaccine (3 - 2023-24 season) 07/28/2022   INFLUENZA VACCINE  06/28/2023   MAMMOGRAM  04/02/2025   COLONOSCOPY (Pts 45-5yrs Insurance coverage will need to be confirmed)  01/19/2028   DTaP/Tdap/Td (3 - Td or Tdap) 04/16/2033   Hepatitis C Screening  Completed   Zoster Vaccines- Shingrix  Completed   HPV VACCINES  Aged Out   HIV Screening  Discontinued   Discussed health benefits of physical activity, and encouraged her to engage in regular exercise appropriate for her age and condition.  Problem List Items Addressed This Visit       Other   Chronic pain of left knee    Intermittent Worse with prolonged standing and walking Request for ortho referral      Relevant Medications   DULoxetine (CYMBALTA) 60 MG capsule   Other Relevant Orders   Ambulatory referral to Orthopedics   Elevated LDL cholesterol level   Relevant Orders   Lipid panel   Recurrent major depressive disorder, in full remission (HCC)    Stable with cymbalta Refill sent      Relevant Medications   DULoxetine (CYMBALTA) 60 MG capsule   Other Visit Diagnoses     Encounter for preventative adult health care exam with abnormal findings    -  Primary   Encounter for  immunization       Relevant Orders   Tdap vaccine greater than or equal to 7yo IM (Completed)   Abnormal urine odor       Relevant Orders   Urinalysis w microscopic + reflex cultur   Vaginal odor       Relevant Orders   Cervicovaginal ancillary only( [place])      Return in about 6 months (around 10/18/2023) for HTN, prediabetes, hyperlipidemia (fasting).     Alysia Penna, NP

## 2023-04-17 NOTE — Assessment & Plan Note (Signed)
Stable with cymbalta ?Refill sent ?

## 2023-04-17 NOTE — Patient Instructions (Signed)
Go to lab Continue Heart healthy diet and daily exercise. Maintain current medications.  Uqora probiotics or Feminine balance probiotics or [place]urelle Probiotics or Florastor Probiotics.   Preventive Care 64-54 Years Old, Female Preventive care refers to lifestyle choices and visits with your health care provider that can promote health and wellness. Preventive care visits are also called wellness exams. What can I expect for my preventive care visit? Counseling Your health care provider may ask you questions about your: Medical history, including: Past medical problems. Family medical history. Pregnancy history. Current health, including: Menstrual cycle. Method of birth control. Emotional well-being. Home life and relationship well-being. Sexual activity and sexual health. Lifestyle, including: Alcohol, nicotine or tobacco, and drug use. Access to firearms. Diet, exercise, and sleep habits. Work and work Astronomer. Sunscreen use. Safety issues such as seatbelt and bike helmet use. Physical exam Your health care provider will check your: Height and weight. These may be used to calculate your BMI (body mass index). BMI is a measurement that tells if you are at a healthy weight. Waist circumference. This measures the distance around your waistline. This measurement also tells if you are at a healthy weight and may help predict your risk of certain diseases, such as type 2 diabetes and high blood pressure. Heart rate and blood pressure. Body temperature. Skin for abnormal spots. What immunizations do I need?  Vaccines are usually given at various ages, according to a schedule. Your health care provider will recommend vaccines for you based on your age, medical history, and lifestyle or other factors, such as travel or where you work. What tests do I need? Screening Your health care provider may recommend screening tests for certain conditions. This may include: Lipid and  cholesterol levels. Diabetes screening. This is done by checking your blood sugar (glucose) after you have not eaten for a while (fasting). Pelvic exam and Pap test. Hepatitis B test. Hepatitis C test. HIV (human immunodeficiency virus) test. STI (sexually transmitted infection) testing, if you are at risk. Lung cancer screening. Colorectal cancer screening. Mammogram. Talk with your health care provider about when you should start having regular mammograms. This may depend on whether you have a family history of breast cancer. BRCA-related cancer screening. This may be done if you have a family history of breast, ovarian, tubal, or peritoneal cancers. Bone density scan. This is done to screen for osteoporosis. Talk with your health care provider about your test results, treatment options, and if necessary, the need for more tests. Follow these instructions at home: Eating and drinking  Eat a diet that includes fresh fruits and vegetables, whole grains, lean protein, and low-fat dairy products. Take vitamin and mineral supplements as recommended by your health care provider. Do not drink alcohol if: Your health care provider tells you not to drink. You are pregnant, may be pregnant, or are planning to become pregnant. If you drink alcohol: Limit how much you have to 0-1 drink a day. Know how much alcohol is in your drink. In the [place] one drink equals one 12 oz bottle of beer (355 mL), one 5 oz glass of wine (148 mL), or one 1 oz glass of hard liquor (44 mL). Lifestyle Brush your teeth every morning and night with fluoride toothpaste. Floss one time each day. Exercise for at least 30 minutes 5 or more days each week. Do not use any products that contain nicotine or tobacco. These products include cigarettes, chewing tobacco, and vaping devices, such as e-cigarettes. If you need  help quitting, ask your health care provider. Do not use drugs. If you are sexually active, practice safe sex.  Use a condom or other form of protection to prevent STIs. If you do not wish to become pregnant, use a form of birth control. If you plan to become pregnant, see your health care provider for a prepregnancy visit. Take aspirin only as told by your health care provider. Make sure that you understand how much to take and what form to take. Work with your health care provider to find out whether it is safe and beneficial for you to take aspirin daily. Find healthy ways to manage stress, such as: Meditation, yoga, or listening to music. Journaling. Talking to a trusted person. Spending time with friends and family. Minimize exposure to UV radiation to reduce your risk of skin cancer. Safety Always wear your seat belt while driving or riding in a vehicle. Do not drive: If you have been drinking alcohol. Do not ride with someone who has been drinking. When you are tired or distracted. While texting. If you have been using any mind-altering substances or drugs. Wear a helmet and other protective equipment during sports activities. If you have firearms in your house, make sure you follow all gun safety procedures. Seek help if you have been physically or sexually abused. What's next? Visit your health care provider once a year for an annual wellness visit. Ask your health care provider how often you should have your eyes and teeth checked. Stay up to date on all vaccines. This information is not intended to replace advice given to you by your health care provider. Make sure you discuss any questions you have with your health care provider. Document Revised: 05/11/2021 Document Reviewed: 05/11/2021 Elsevier Patient Education  2023 [employer].

## 2023-04-17 NOTE — Assessment & Plan Note (Signed)
Intermittent Worse with prolonged standing and walking Request for ortho referral

## 2023-04-18 LAB — CERVICOVAGINAL ANCILLARY ONLY
Bacterial Vaginitis (gardnerella): NEGATIVE
Candida Glabrata: NEGATIVE
Candida Vaginitis: NEGATIVE
Comment: NEGATIVE
Comment: NEGATIVE
Comment: NEGATIVE

## 2023-04-18 LAB — URINALYSIS W MICROSCOPIC + REFLEX CULTURE
Bacteria, UA: NONE SEEN /HPF
Bilirubin Urine: NEGATIVE
Glucose, UA: NEGATIVE
Hgb urine dipstick: NEGATIVE
Hyaline Cast: NONE SEEN /LPF
Ketones, ur: NEGATIVE
Leukocyte Esterase: NEGATIVE
Nitrites, Initial: NEGATIVE
Protein, ur: NEGATIVE
RBC / HPF: NONE SEEN /HPF (ref 0–2)
Specific Gravity, Urine: 1.021 (ref 1.001–1.035)
WBC, UA: NONE SEEN /HPF (ref 0–5)
pH: 6.5 (ref 5.0–8.0)

## 2023-04-18 LAB — EXTRA URINE SPECIMEN

## 2023-04-18 LAB — NO CULTURE INDICATED

## 2023-04-19 ENCOUNTER — Other Ambulatory Visit (HOSPITAL_COMMUNITY): Payer: Self-pay

## 2023-04-20 ENCOUNTER — Ambulatory Visit
Admission: RE | Admit: 2023-04-20 | Discharge: 2023-04-20 | Disposition: A | Discharge status: Home / Self Care | Payer: 59 | Source: Ambulatory Visit | Attending: Nurse Practitioner | Admitting: Nurse Practitioner

## 2023-04-20 DIAGNOSIS — E010 Iodine-deficiency related diffuse (endemic) goiter: Secondary | ICD-10-CM | POA: Diagnosis not present

## 2023-04-20 DIAGNOSIS — E049 Nontoxic goiter, unspecified: Secondary | ICD-10-CM

## 2023-04-27 ENCOUNTER — Encounter: Payer: Self-pay | Admitting: Physician Assistant

## 2023-04-27 ENCOUNTER — Other Ambulatory Visit (INDEPENDENT_AMBULATORY_CARE_PROVIDER_SITE_OTHER): Payer: 59

## 2023-04-27 ENCOUNTER — Ambulatory Visit: Payer: 59 | Admitting: Physician Assistant

## 2023-04-27 DIAGNOSIS — G8929 Other chronic pain: Secondary | ICD-10-CM

## 2023-04-27 DIAGNOSIS — M25562 Pain in left knee: Secondary | ICD-10-CM

## 2023-04-27 MED ORDER — LIDOCAINE HCL 1 % IJ SOLN
2.0000 mL | INTRAMUSCULAR | Status: AC | PRN
Start: 2023-04-27 — End: 2023-04-27
  Administered 2023-04-27: 2 mL

## 2023-04-27 MED ORDER — METHYLPREDNISOLONE ACETATE 40 MG/ML IJ SUSP
40.0000 mg | INTRAMUSCULAR | Status: AC | PRN
Start: 2023-04-27 — End: 2023-04-27
  Administered 2023-04-27: 40 mg via INTRA_ARTICULAR

## 2023-04-27 MED ORDER — BUPIVACAINE HCL 0.25 % IJ SOLN
2.0000 mL | INTRAMUSCULAR | Status: AC | PRN
Start: 2023-04-27 — End: 2023-04-27
  Administered 2023-04-27: 2 mL via INTRA_ARTICULAR

## 2023-04-27 NOTE — Progress Notes (Signed)
Office Visit Note   Patient: Rebecca [name]           Date of Birth: 1971/11/02           MRN: [id number] Visit Date: 04/27/2023              Requested by: Anne Ng, NP [id number] [place],  [place] [address] PCP: Anne Ng, NP   Assessment & Plan: Visit Diagnoses:  1. Chronic pain of left knee     Plan: Impression is recurrent left knee pain.  At this point, we have discussed reinjection with cortisone versus viscosupplementation approval versus MRI to assess for degree of arthritis versus less likely meniscal pathology given the patient's symptoms.  She would like to try cortisone injection today.  If her symptoms do not improve she will let us know we will obtain viscosupplementation approval.  Call with concerns or questions in the meantime.  This patient is diagnosed with osteoarthritis of the knee(s).    Radiographs show evidence of joint space narrowing, osteophytes, subchondral sclerosis and/or subchondral cysts.  This patient has knee pain which interferes with functional and activities of daily living.    This patient has experienced inadequate response, adverse effects and/or intolerance with conservative treatments such as acetaminophen, NSAIDS, topical creams, physical therapy or regular exercise, knee bracing and/or weight loss.   This patient has experienced inadequate response or has a contraindication to intra articular steroid injections for at least 3 months.   This patient is not scheduled to have a total knee replacement within 6 months of starting treatment with viscosupplementation.   Follow-Up Instructions: No follow-ups on file.   Orders:  Orders Placed This Encounter  Procedures   Large Joint Inj   XR KNEE 3 VIEW LEFT   No orders of the defined types were placed in this encounter.     Procedures: Large Joint Inj: L knee on 04/27/2023 3:26 PM Indications: pain Details: 22 G needle, anterolateral  approach Medications: 2 mL lidocaine 1 %; 2 mL bupivacaine 0.25 %; 40 mg methylPREDNISolone acetate 40 MG/ML      Clinical Data: No additional findings.   Subjective: Chief Complaint  Patient presents with   Left Knee - Pain    HPI patient is a pleasant 52 year old female who comes in today with recurrent left knee pain.  History of chronic knee pain for several years.  The pain she has is primarily to the medial aspect and is described as a constant pain worse at certain times but really no specific aggravators that she can recall.  She denies any mechanical symptoms.  She takes ibuprofen daily for total body pain.  She underwent left knee cortisone injection back in 2019 for which she thinks helped for maybe 3 months or so.  She has not tried viscosupplementation injections.  Review of Systems as detailed in HPI.  All others reviewed and are negative.   Objective: Vital Signs: LMP 08/13/2013 Comment: negative urine pregnancy test 12-2013  Physical Exam well-developed well-nourished female no acute distress.  Alert and oriented x 3.  Ortho Exam left knee exam shows no effusion.  Range of motion 0 to 100 degrees.  Medial joint line tenderness.  No patellofemoral crepitus.  She is neurovascularly intact distally.  Specialty Comments:  No specialty comments available.  Imaging: XR KNEE 3 VIEW LEFT  Result Date: 04/27/2023 Mild joint space narrowing medial compartment    PMFS History: Patient Active Problem List   Diagnosis Date  Noted   Elevated LDL cholesterol level 04/17/2023   Recurrent major depressive disorder, in full remission (HCC) 04/17/2023   Other fatigue 01/23/2023   History of DVT (deep vein thrombosis)- pregnancy-related 02/08/2022   Hypnic headache 08/07/2019   Loud snoring 08/07/2019   Chronic pain of left knee 07/03/2018   Onychomycosis 05/17/2018   Tinea pedis of both feet 05/17/2018   Prediabetes 11/16/2014   Obesity 11/16/2014   Essential  hypertension 11/16/2014   Cholelithiasis 05/22/2012   Benign colonic polyp 01/15/2012   Constipation 12/12/2011   Vitamin D deficiency 12/12/2011   GERD (gastroesophageal reflux disease) 01/24/2007   Past Medical History:  Diagnosis Date   Anemia    Arthritis    Colon polyp 12/25/2011   Tubular adenomatous   Depression    following death of mother   Duodenitis    DVT (deep vein thrombosis) in pregnancy    Esophagitis    Fatigue    Gallstones    GERD (gastroesophageal reflux disease)    Hemorrhagic ovarian cyst 05/22/2012   04/2012    Hiatal hernia    Infection    UTI   Obesity    PUD (peptic ulcer disease)    Sacroiliac pain    Vaginal Pap smear, abnormal    colpo, results ok   Vitamin D deficiency     Family History  Problem Relation Age of Onset   Aneurysm Mother    Heart disease Mother    Hypertension Mother    Stroke Mother    Lupus Mother    Diabetes Maternal Aunt    Cancer Maternal Aunt    Colon cancer Neg Hx    Esophageal cancer Neg Hx    Stomach cancer Neg Hx    Rectal cancer Neg Hx    Colon polyps Neg Hx     Past Surgical History:  Procedure Laterality Date   BREAST EXCISIONAL BIOPSY Left    benign   COLONOSCOPY     COLPOSCOPY  2012   NEG   DILATATION & CURETTAGE/HYSTEROSCOPY WITH MYOSURE N/A 01/17/2017   Procedure: DILATATION & CURETTAGE/HYSTEROSCOPY WITH MYOSURE;  Surgeon: Maxie Better, MD;  Location: [facility];  Service: Gynecology;  Laterality: N/A;   POLYPECTOMY     TUBAL LIGATION     Social History   Occupational History   Occupation: NSNT    Employer: [employer]  Tobacco Use   Smoking status: Former    Packs/day: 0.50    Years: 5.00    Additional pack years: 0.00    Total pack years: 2.50    Types: Cigarettes, E-cigarettes    Quit date: 09/14/2022    Years since quitting: 0.6   Smokeless tobacco: Never   Tobacco comments:    4 cigs a day   Vaping Use   Vaping Use: Every day   Start date: 08/27/2022  Substance and  Sexual Activity   Alcohol use: Yes    Comment: occasional glass of wine   Drug use: No   Sexual activity: Yes    Birth control/protection: Post-menopausal

## 2023-05-12 ENCOUNTER — Encounter (HOSPITAL_BASED_OUTPATIENT_CLINIC_OR_DEPARTMENT_OTHER): Payer: Self-pay | Admitting: Emergency Medicine

## 2023-05-12 ENCOUNTER — Emergency Department (HOSPITAL_BASED_OUTPATIENT_CLINIC_OR_DEPARTMENT_OTHER)
Admission: EM | Admit: 2023-05-12 | Discharge: 2023-05-12 | Disposition: A | Discharge status: Home / Self Care | Payer: 59 | Attending: Emergency Medicine | Admitting: Emergency Medicine

## 2023-05-12 DIAGNOSIS — E860 Dehydration: Secondary | ICD-10-CM | POA: Diagnosis not present

## 2023-05-12 DIAGNOSIS — R457 State of emotional shock and stress, unspecified: Secondary | ICD-10-CM | POA: Diagnosis not present

## 2023-05-12 DIAGNOSIS — Z79899 Other long term (current) drug therapy: Secondary | ICD-10-CM | POA: Diagnosis not present

## 2023-05-12 DIAGNOSIS — R000 Tachycardia, unspecified: Secondary | ICD-10-CM | POA: Diagnosis not present

## 2023-05-12 DIAGNOSIS — I10 Essential (primary) hypertension: Secondary | ICD-10-CM | POA: Diagnosis not present

## 2023-05-12 DIAGNOSIS — R42 Dizziness and giddiness: Secondary | ICD-10-CM | POA: Diagnosis not present

## 2023-05-12 DIAGNOSIS — T675XXA Heat exhaustion, unspecified, initial encounter: Secondary | ICD-10-CM | POA: Diagnosis not present

## 2023-05-12 LAB — CBC WITH DIFFERENTIAL/PLATELET
Abs Immature Granulocytes: 0.02 10*3/uL (ref 0.00–0.07)
Basophils Absolute: 0 10*3/uL (ref 0.0–0.1)
Basophils Relative: 1 %
Eosinophils Absolute: 0.1 10*3/uL (ref 0.0–0.5)
Eosinophils Relative: 1 %
HCT: 39.7 % (ref 36.0–46.0)
Hemoglobin: 12.9 g/dL (ref 12.0–15.0)
Immature Granulocytes: 0 %
Lymphocytes Relative: 31 %
Lymphs Abs: 2 10*3/uL (ref 0.7–4.0)
MCH: 28.5 pg (ref 26.0–34.0)
MCHC: 32.5 g/dL (ref 30.0–36.0)
MCV: 87.8 fL (ref 80.0–100.0)
Monocytes Absolute: 0.7 10*3/uL (ref 0.1–1.0)
Monocytes Relative: 10 %
Neutro Abs: 3.6 10*3/uL (ref 1.7–7.7)
Neutrophils Relative %: 57 %
Platelets: 243 10*3/uL (ref 150–400)
RBC: 4.52 MIL/uL (ref 3.87–5.11)
RDW: 12.8 % (ref 11.5–15.5)
WBC: 6.5 10*3/uL (ref 4.0–10.5)
nRBC: 0 % (ref 0.0–0.2)

## 2023-05-12 LAB — COMPREHENSIVE METABOLIC PANEL
ALT: 22 U/L (ref 0–44)
AST: 20 U/L (ref 15–41)
Albumin: 4 g/dL (ref 3.5–5.0)
Alkaline Phosphatase: 77 U/L (ref 38–126)
Anion gap: 8 (ref 5–15)
BUN: 16 mg/dL (ref 6–20)
CO2: 25 mmol/L (ref 22–32)
Calcium: 9.1 mg/dL (ref 8.9–10.3)
Chloride: 104 mmol/L (ref 98–111)
Creatinine, Ser: 0.97 mg/dL (ref 0.44–1.00)
GFR, Estimated: >60 mL/min (ref >60)
Glucose, Bld: 105 mg/dL — ABNORMAL HIGH (ref 70–99)
Potassium: 4.5 mmol/L (ref 3.5–5.1)
Sodium: 137 mmol/L (ref 135–145)
Total Bilirubin: 0.8 mg/dL (ref 0.3–1.2)
Total Protein: 7.2 g/dL (ref 6.5–8.1)

## 2023-05-12 LAB — CK: Total CK: 225 U/L (ref 38–234)

## 2023-05-12 MED ORDER — ONDANSETRON 4 MG PO TBDP: ORAL_TABLET | ORAL | 0 refills | Status: DC

## 2023-05-12 MED ORDER — ONDANSETRON HCL 4 MG/2ML IJ SOLN
4.0000 mg | Freq: Once | INTRAMUSCULAR | Status: AC
  Administered 2023-05-12: 4 mg via INTRAVENOUS
  Filled 2023-05-12: qty 2

## 2023-05-12 MED ORDER — SODIUM CHLORIDE 0.9 % IV BOLUS
1000.0000 mL | Freq: Once | INTRAVENOUS | Status: AC
  Administered 2023-05-12: 1000 mL via INTRAVENOUS

## 2023-05-12 NOTE — Discharge Instructions (Signed)
You have heat exhaustion.  Please stay hydrated and stay cool and take Zofran as needed  Return to ER if you have worse dizziness or passing out or palpitations

## 2023-05-12 NOTE — ED Notes (Signed)
Ginger Ale provided for po challenge 

## 2023-05-12 NOTE — ED Provider Notes (Signed)
[facility] Provider Note   CSN: [id number] Arrival date & time: 05/12/23  1832     History  Chief Complaint  Patient presents with   Dehydration    Rebecca [name] is a 52 y.o. female history of hypertension here presenting with heat exhaustion.  Patient states that she works for [employer].  She states that she went to work at 5:30 AM this morning.  She states that she finished her second job and went home and went to an outdoor event.  She states that she is in the sun for some time.  She states that she did not drink much water and felt very lightheaded and dizzy.  Patient was noted to be tachycardic with heart rate in the 140s.  Patient was given 500 cc IV fluids prior to arrival.  The history is provided by the patient.       Home Medications Prior to Admission medications   Medication Sig Start Date End Date Taking? Authorizing Provider  amLODipine (NORVASC) 5 MG tablet Take 1 tablet (5 mg total) by mouth at bedtime. 03/12/23   Nche, Bonna Gains, NP  DULoxetine (CYMBALTA) 60 MG capsule Take 1 capsule by mouth daily. 04/17/23   Nche, Bonna Gains, NP  triamterene-hydrochlorothiazide (MAXZIDE-25) 37.5-25 MG tablet Take 1/2 tablet by mouth daily. 03/12/23   Nche, Bonna Gains, NP  Vitamin D, Ergocalciferol, (DRISDOL) 1.25 MG (50000 UNIT) CAPS capsule Take 1 capsule (50,000 Units total) by mouth every 7 (seven) days. 09/24/22   Nche, Bonna Gains, NP      Allergies    Penicillins    Review of Systems   Review of Systems  Neurological:  Positive for dizziness.  All other systems reviewed and are negative.   Physical Exam Updated Vital Signs BP (!) 132/94   Pulse (!) 108   Temp 99.1 F (37.3 C) (Oral)   Resp 18   LMP 08/13/2013 Comment: negative urine pregnancy test 12-2013  SpO2 91%  Physical Exam Vitals and nursing note reviewed.  Constitutional:      Comments: Slightly dehydrated  HENT:     Head: Normocephalic.      Nose: Nose normal.     Mouth/Throat:     Mouth: Mucous membranes are dry.  Eyes:     Extraocular Movements: Extraocular movements intact.     Pupils: Pupils are equal, round, and reactive to light.  Cardiovascular:     Rate and Rhythm: Normal rate and regular rhythm.     Pulses: Normal pulses.     Heart sounds: Normal heart sounds.  Pulmonary:     Effort: Pulmonary effort is normal.     Breath sounds: Normal breath sounds.  Abdominal:     General: Abdomen is flat.     Palpations: Abdomen is soft.  Musculoskeletal:        General: Normal range of motion.     Cervical back: Normal range of motion and neck supple.  Skin:    General: Skin is warm.     Capillary Refill: Capillary refill takes less than 2 seconds.  Neurological:     General: No focal deficit present.     Mental Status: She is oriented to person, place, and time.  Psychiatric:        Mood and Affect: Mood normal.        Behavior: Behavior normal.     ED Results / Procedures / Treatments   Labs (all labs ordered are listed,  but only abnormal results are displayed) Labs Reviewed  CBC WITH DIFFERENTIAL/PLATELET  COMPREHENSIVE METABOLIC PANEL  CK    EKG EKG Interpretation  Date/Time:  Saturday May 12 2023 18:45:49 EDT Ventricular Rate:  102 PR Interval:  130 QRS Duration: 86 QT Interval:  331 QTC Calculation: 432 R Axis:   58 Text Interpretation: Sinus tachycardia Since last tracing rate faster Confirmed by Richardean Canal [phone]) on 05/12/2023 6:47:26 PM  Radiology No results found.  Procedures Procedures    Medications Ordered in ED Medications  sodium chloride 0.9 % bolus 1,000 mL (has no administration in time range)    ED Course/ Medical Decision Making/ A&P                             Medical Decision Making Rebecca [name] is a 52 y.o. female here presenting with dizziness and heat exhaustion.  Patient has been working all day.  Patient appears dehydrated.  Will check CBC CMP and CK  level and hydrate patient and reassess.  8:52 PM Reviewed patient's labs and they were unremarkable.  In particular CK level is normal.  Patient was given IV fluids and felt better.  Tolerated p.o. in the ED.  Patient likely has mild heat exhaustion.  Amount and/or Complexity of Data Reviewed Labs: ordered. Decision-making details documented in ED Course. ECG/medicine tests: ordered and independent interpretation performed. Decision-making details documented in ED Course.  Risk Prescription drug management.   Final Clinical Impression(s) / ED Diagnoses Final diagnoses:  None    Rx / DC Orders ED Discharge Orders     None         Charlynne Pander, MD 05/12/23 2053

## 2023-05-12 NOTE — ED Triage Notes (Addendum)
Per EMS. Pt was at outdoor festival today and began to feel dehydrated. Upon EMS arrival HR 146. EMS gave NS which brought down HR to 108. Pt feeling better. No LOC. Pt adds she felt lightheaded and nauseated.

## 2023-07-03 ENCOUNTER — Encounter: Payer: Self-pay | Admitting: Nurse Practitioner

## 2023-07-03 ENCOUNTER — Other Ambulatory Visit (HOSPITAL_COMMUNITY): Payer: Self-pay

## 2023-07-03 ENCOUNTER — Ambulatory Visit: Payer: 59 | Admitting: Nurse Practitioner

## 2023-07-03 VITALS — BP 124/82 | HR 104 | Temp 98.4°F | Resp 16 | Ht 66.0 in | Wt 218.8 lb

## 2023-07-03 DIAGNOSIS — K529 Noninfective gastroenteritis and colitis, unspecified: Secondary | ICD-10-CM | POA: Diagnosis not present

## 2023-07-03 LAB — POCT URINALYSIS DIPSTICK
Bilirubin, UA: NEGATIVE
Blood, UA: NEGATIVE
Glucose, UA: NEGATIVE
Ketones, UA: NEGATIVE
Leukocytes, UA: NEGATIVE
Nitrite, UA: NEGATIVE
Protein, UA: NEGATIVE
Spec Grav, UA: 1.025 (ref 1.010–1.025)
Urobilinogen, UA: 0.2 E.U./dL
pH, UA: 6 (ref 5.0–8.0)

## 2023-07-03 MED ORDER — PROMETHAZINE HCL 12.5 MG PO TABS
12.5000 mg | ORAL_TABLET | Freq: Four times a day (QID) | ORAL | 0 refills | Status: DC | PRN
Start: 2023-07-03 — End: 2024-05-01
  Filled 2023-07-03: qty 20, 5d supply, fill #0

## 2023-07-03 NOTE — Progress Notes (Signed)
Established Patient Visit  Patient: Rebecca [name]   DOB: 1971/08/21   52 y.o. Female  MRN: [id number] Visit Date: 07/03/2023  Subjective:    Chief Complaint  Patient presents with   GI Problem    Nausea, bloating, diarrhea, constipation and epigastric pain for one week.  It get's worse with food.     GI Problem The primary symptoms include fatigue, abdominal pain, nausea, diarrhea and myalgias. Primary symptoms do not include fever, vomiting, melena, hematemesis, jaundice, hematochezia, dysuria, arthralgias or rash. The illness began more than 7 days ago. The onset was sudden. The problem has not changed since onset. The abdominal pain began more than 2 days ago. The abdominal pain has been unchanged since its onset. The abdominal pain is located in the epigastric region. The abdominal pain does not radiate. Eating relieves the abdominal pain.  The illness is also significant for chills, anorexia and bloating. The illness does not include dysphagia, odynophagia, constipation, tenesmus, back pain or itching. Associated medical issues do not include inflammatory bowel disease, GERD, gallstones, liver disease, alcohol abuse, gastric bypass, bowel resection, irritable bowel syndrome, hemorrhoids or diverticulitis.  Onset 1.5week ago, after eating [place] food, reports co workers with similar symptoms Did not take any OVER THE COUNTER med. COVID POCT negative x 2 at home No oral abx in last 3months  Reviewed medical, surgical, and social history today  Medications: Outpatient Medications Prior to Visit  Medication Sig   Ascorbic Acid (VITAMIN C) 100 MG tablet Take 100 mg by mouth daily.   ELDERBERRY PO Take by mouth.   Probiotic Product (PROBIOTIC BLEND PO) Take by mouth.   amLODipine (NORVASC) 5 MG tablet Take 1 tablet (5 mg total) by mouth at bedtime.   DULoxetine (CYMBALTA) 60 MG capsule Take 1 capsule by mouth daily.   ondansetron (ZOFRAN-ODT) 4 MG disintegrating tablet  4mg  ODT q4 hours prn nausea/vomit   triamterene-hydrochlorothiazide (MAXZIDE-25) 37.5-25 MG tablet Take 1/2 tablet by mouth daily.   Vitamin D, Ergocalciferol, (DRISDOL) 1.25 MG (50000 UNIT) CAPS capsule Take 1 capsule (50,000 Units total) by mouth every 7 (seven) days.   No facility-administered medications prior to visit.   Reviewed past medical and social history.   ROS per HPI above      Objective:  BP 124/82 (BP Location: Left Arm, Patient Position: Sitting, Cuff Size: Large)   Pulse (!) 104   Temp 98.4 F (36.9 C) (Temporal)   Resp 16   Ht 5\' 6"  (1.676 m)   Wt 218 lb 12.8 oz (99.2 kg)   LMP 08/13/2013 Comment: negative urine pregnancy test 12-2013  SpO2 98%   BMI 35.32 kg/m      Physical Exam Vitals and nursing note reviewed.  Constitutional:      General: She is not in acute distress. Cardiovascular:     Rate and Rhythm: Normal rate.     Pulses: Normal pulses.  Pulmonary:     Effort: Pulmonary effort is normal.  Abdominal:     General: There is no distension.     Palpations: Abdomen is soft.     Tenderness: There is abdominal tenderness in the left upper quadrant. There is no right CVA tenderness, left CVA tenderness or guarding.  Skin:    Findings: No rash.  Neurological:     Mental Status: She is oriented to person, place, and time.     Results for orders placed or performed  in visit on 07/03/23  POCT urinalysis dipstick  Result Value Ref Range   Color, UA Yellow    Clarity, UA Clear    Glucose, UA Negative Negative   Bilirubin, UA Negative    Ketones, UA Negative    Spec Grav, UA 1.025 1.010 - 1.025   Blood, UA Negative    pH, UA 6.0 5.0 - 8.0   Protein, UA Negative Negative   Urobilinogen, UA 0.2 0.2 or 1.0 E.U./dL   Nitrite, UA negative    Leukocytes, UA Negative Negative   Appearance     Odor        Assessment & Plan:    Problem List Items Addressed This Visit   None Visit Diagnoses     Gastroenteritis    -  Primary   Relevant  Medications   promethazine (PHENERGAN) 12.5 MG tablet   Other Relevant Orders   Lipase   Comprehensive metabolic panel   CBC with Differential/Platelet   POCT urinalysis dipstick (Completed)   Stool culture      Advised to Return stool to lab as soon as possible, Maintain BRAT diet and adequate oral hydration, use pedialyte or gartorade for hydration, use peptobismol for diarrhea, use pepcid AC for epigastric pain.  Return if symptoms worsen or fail to improve.     Alysia Penna, NP

## 2023-07-03 NOTE — Patient Instructions (Addendum)
Go to lab Return stool to lab as soon as possible Maintain BRAT diet and adequate oral hydration. Ok to use pedialyte or gartorade for hydration. Ok to use peptobismol for diarrhea Ok to use pepcid [facility] for epigastric pain  Food Choices to Help Relieve Diarrhea, Adult Diarrhea can make you feel weak and cause you to become dehydrated. Dehydration is a condition in which there is not enough water or other fluids in the body. It is important to choose the right foods and drinks to: Relieve diarrhea. Replace lost fluids and nutrients. Prevent dehydration. What are tips for following this plan? Relieving diarrhea Avoid foods that make your diarrhea worse. These may include: Foods and drinks that are sweetened with high-fructose corn syrup, honey, or sweeteners such as xylitol, sorbitol, and mannitol. Check food labels for these ingredients. Fried, greasy, or spicy foods. Raw fruits and vegetables. Eat foods that are rich in probiotics. These include foods such as yogurt and fermented milk products. Probiotics can help increase healthy bacteria in your stomach and intestines (gastrointestinal or GI tract). This may help digestion and stop diarrhea. If you have lactose intolerance, avoid dairy products. These may make your diarrhea worse. Take medicine to help stop diarrhea only as told by your health care provider. Replacing nutrients  Eat bland, easy-to-digest foods in small amounts as you are able, until your diarrhea starts to get better. These foods include bananas, applesauce, rice, toast, and crackers. Over time, add nutrient-rich foods as your body tolerates them or as told by your health care provider. These include: Well-cooked protein foods, such as eggs, lean meats like fish or chicken without skin, and tofu. Peeled, seeded, and soft-cooked fruits and vegetables. Low-fat dairy products. Whole grains. Take vitamin and mineral supplements as told by your health care  provider. Preventing dehydration  Start by sipping water or a solution to prevent dehydration (oral rehydration solution, or ORS). This is a drink that helps replace fluids and minerals your body has lost. You can buy an ORS at pharmacies and retail stores. Try to drink at least 8-10 cups (2,000-2,500 mL) of fluid each day to help replace lost fluids. If your urine is pale yellow, you are getting enough fluids. You may drink other liquids in addition to water, such as fruit juice that you have added water to (diluted fruit juice) or low-calorie sports drinks, as tolerated or as told by your health care provider. Avoid drinks with caffeine, such as coffee, tea, or soft drinks. Avoid alcohol. This information is not intended to replace advice given to you by your health care provider. Make sure you discuss any questions you have with your health care provider. Document Revised: 05/02/2022 Document Reviewed: 05/02/2022 Elsevier Patient Education  2024 [employer].

## 2023-07-04 ENCOUNTER — Ambulatory Visit: Payer: 59 | Admitting: Nurse Practitioner

## 2023-07-04 NOTE — Progress Notes (Signed)
Stable Follow instructions as discussed during office visit.

## 2023-07-13 ENCOUNTER — Other Ambulatory Visit (HOSPITAL_COMMUNITY): Payer: Self-pay

## 2023-09-18 ENCOUNTER — Other Ambulatory Visit: Payer: Self-pay | Admitting: Nurse Practitioner

## 2023-09-18 DIAGNOSIS — I10 Essential (primary) hypertension: Secondary | ICD-10-CM

## 2023-09-18 DIAGNOSIS — E559 Vitamin D deficiency, unspecified: Secondary | ICD-10-CM

## 2023-09-19 ENCOUNTER — Other Ambulatory Visit: Payer: Self-pay

## 2023-09-20 ENCOUNTER — Other Ambulatory Visit (HOSPITAL_COMMUNITY): Payer: Self-pay

## 2023-09-20 ENCOUNTER — Other Ambulatory Visit: Payer: Self-pay

## 2023-09-20 MED ORDER — AMLODIPINE BESYLATE 5 MG PO TABS
5.0000 mg | ORAL_TABLET | Freq: Every day | ORAL | 0 refills | Status: DC
Start: 2023-09-20 — End: 2024-02-17
  Filled 2023-09-20: qty 90, 90d supply, fill #0

## 2023-09-20 MED ORDER — TRIAMTERENE-HCTZ 37.5-25 MG PO TABS
0.5000 | ORAL_TABLET | Freq: Every day | ORAL | 0 refills | Status: DC
Start: 2023-09-20 — End: 2024-02-17
  Filled 2023-09-20: qty 45, 90d supply, fill #0

## 2023-09-24 ENCOUNTER — Other Ambulatory Visit: Payer: Self-pay

## 2023-10-08 ENCOUNTER — Other Ambulatory Visit (HOSPITAL_COMMUNITY): Payer: Self-pay

## 2023-10-16 ENCOUNTER — Other Ambulatory Visit (HOSPITAL_COMMUNITY): Payer: Self-pay

## 2024-02-17 ENCOUNTER — Other Ambulatory Visit: Payer: Self-pay | Admitting: Nurse Practitioner

## 2024-02-17 DIAGNOSIS — I10 Essential (primary) hypertension: Secondary | ICD-10-CM

## 2024-02-18 ENCOUNTER — Other Ambulatory Visit (HOSPITAL_COMMUNITY): Payer: Self-pay

## 2024-02-18 NOTE — Telephone Encounter (Signed)
 Medication: Amlodipine (Norvasc) 5 mg  Directions: Take 1 tablet by mouth at bedtime  Last given: 09/20/23 Number refills: 0 Last o/v: 07/13/23 (acute) 04/17/23 Follow up: 6 months-04/18/24 (CPE) Labs: 07/03/23   Do you want her scheduled for a follow up or keep CPE appointment and give enough until that appointment?

## 2024-02-19 ENCOUNTER — Encounter: Payer: Self-pay | Admitting: Pharmacist

## 2024-02-19 ENCOUNTER — Other Ambulatory Visit (HOSPITAL_COMMUNITY): Payer: Self-pay

## 2024-02-19 ENCOUNTER — Other Ambulatory Visit: Payer: Self-pay

## 2024-02-19 MED ORDER — AMLODIPINE BESYLATE 5 MG PO TABS
5.0000 mg | ORAL_TABLET | Freq: Every day | ORAL | 0 refills | Status: DC
Start: 2024-02-19 — End: 2024-05-01
  Filled 2024-02-19: qty 60, 60d supply, fill #0

## 2024-02-19 MED ORDER — TRIAMTERENE-HCTZ 37.5-25 MG PO TABS
0.5000 | ORAL_TABLET | Freq: Every day | ORAL | 0 refills | Status: DC
Start: 2024-02-19 — End: 2024-05-01
  Filled 2024-02-19: qty 30, 60d supply, fill #0

## 2024-02-22 ENCOUNTER — Other Ambulatory Visit: Payer: Self-pay

## 2024-04-02 ENCOUNTER — Other Ambulatory Visit: Payer: Self-pay | Admitting: Nurse Practitioner

## 2024-04-02 ENCOUNTER — Encounter (HOSPITAL_COMMUNITY): Payer: Self-pay

## 2024-04-02 DIAGNOSIS — Z1231 Encounter for screening mammogram for malignant neoplasm of breast: Secondary | ICD-10-CM

## 2024-04-03 ENCOUNTER — Other Ambulatory Visit (HOSPITAL_COMMUNITY): Payer: Self-pay

## 2024-04-09 ENCOUNTER — Ambulatory Visit
Admission: RE | Admit: 2024-04-09 | Discharge: 2024-04-09 | Disposition: A | Discharge status: Home / Self Care | Source: Ambulatory Visit

## 2024-04-09 DIAGNOSIS — Z1231 Encounter for screening mammogram for malignant neoplasm of breast: Secondary | ICD-10-CM | POA: Diagnosis not present

## 2024-04-18 ENCOUNTER — Encounter: Admitting: Nurse Practitioner

## 2024-05-01 ENCOUNTER — Other Ambulatory Visit: Payer: Self-pay

## 2024-05-01 ENCOUNTER — Ambulatory Visit (INDEPENDENT_AMBULATORY_CARE_PROVIDER_SITE_OTHER): Admitting: Nurse Practitioner

## 2024-05-01 ENCOUNTER — Other Ambulatory Visit (HOSPITAL_COMMUNITY): Payer: Self-pay

## 2024-05-01 ENCOUNTER — Encounter: Payer: Self-pay | Admitting: Nurse Practitioner

## 2024-05-01 ENCOUNTER — Ambulatory Visit: Admitting: Nurse Practitioner

## 2024-05-01 VITALS — BP 120/78 | HR 94 | Temp 97.8°F | Ht 66.0 in | Wt 219.4 lb

## 2024-05-01 DIAGNOSIS — E7800 Pure hypercholesterolemia, unspecified: Secondary | ICD-10-CM | POA: Diagnosis not present

## 2024-05-01 DIAGNOSIS — R0683 Snoring: Secondary | ICD-10-CM

## 2024-05-01 DIAGNOSIS — E66812 Obesity, class 2: Secondary | ICD-10-CM | POA: Diagnosis not present

## 2024-05-01 DIAGNOSIS — R7303 Prediabetes: Secondary | ICD-10-CM

## 2024-05-01 DIAGNOSIS — E559 Vitamin D deficiency, unspecified: Secondary | ICD-10-CM

## 2024-05-01 DIAGNOSIS — E049 Nontoxic goiter, unspecified: Secondary | ICD-10-CM | POA: Diagnosis not present

## 2024-05-01 DIAGNOSIS — R5383 Other fatigue: Secondary | ICD-10-CM | POA: Diagnosis not present

## 2024-05-01 DIAGNOSIS — G4481 Hypnic headache: Secondary | ICD-10-CM

## 2024-05-01 DIAGNOSIS — Z6835 Body mass index (BMI) 35.0-35.9, adult: Secondary | ICD-10-CM

## 2024-05-01 DIAGNOSIS — I10 Essential (primary) hypertension: Secondary | ICD-10-CM | POA: Diagnosis not present

## 2024-05-01 DIAGNOSIS — F3342 Major depressive disorder, recurrent, in full remission: Secondary | ICD-10-CM | POA: Diagnosis not present

## 2024-05-01 LAB — VITAMIN D 25 HYDROXY (VIT D DEFICIENCY, FRACTURES): VITD: 26.37 ng/mL — ABNORMAL LOW (ref 30.00–100.00)

## 2024-05-01 LAB — HEMOGLOBIN A1C: Hgb A1c MFr Bld: 6.4 % (ref 4.6–6.5)

## 2024-05-01 LAB — TSH: TSH: 1.2 u[IU]/mL (ref 0.35–5.50)

## 2024-05-01 LAB — VITAMIN B12: Vitamin B-12: 515 pg/mL (ref 211–911)

## 2024-05-01 MED ORDER — AMLODIPINE BESYLATE 5 MG PO TABS
5.0000 mg | ORAL_TABLET | Freq: Every day | ORAL | 3 refills | Status: AC
Start: 2024-05-01 — End: ?
  Filled 2024-05-01 (×2): qty 90, 90d supply, fill #0
  Filled 2024-09-15 – 2024-10-17 (×2): qty 90, 90d supply, fill #1

## 2024-05-01 MED ORDER — DULOXETINE HCL 60 MG PO CPEP
60.0000 mg | ORAL_CAPSULE | Freq: Every day | ORAL | 3 refills | Status: AC
Start: 2024-05-01 — End: ?
  Filled 2024-05-01 – 2024-10-17 (×5): qty 90, 90d supply, fill #0
  Filled 2024-10-29 – 2024-10-30 (×2): qty 90, 90d supply, fill #1

## 2024-05-01 MED ORDER — TRIAMTERENE-HCTZ 37.5-25 MG PO TABS
0.5000 | ORAL_TABLET | Freq: Every day | ORAL | 1 refills | Status: AC
Start: 2024-05-01 — End: ?
  Filled 2024-05-01 (×2): qty 45, 90d supply, fill #0
  Filled 2024-09-15 – 2024-10-17 (×2): qty 45, 90d supply, fill #1

## 2024-05-01 NOTE — Assessment & Plan Note (Addendum)
 Persistent daytime somnolence with loud snoring, witnessed apneic episodes, and hypnic headaches She has hx of obesity, HYPERTENSION and prediabetes No LE edema. Last sleep study 2015: negative OBSTRUCTIVE SLEEP APNEA.(Weight at 200lbs) Current weight 219lbs. CBC 06/2023: normal  Entered ref to sleep specialist Check B12, vit. D, tSh, BMP Encouraged to maintain daily exercise

## 2024-05-01 NOTE — Progress Notes (Signed)
 Established Patient Visit  Patient: Rebecca [name]   DOB: 09/29/1971   53 y.o. Female  MRN: [id number] Visit Date: 05/01/2024  Subjective:     Chief Complaint  Patient presents with   Follow-up    F/u wants refill on Vitamin D ,     HPI Essential hypertension BP at goal with maxzide  and amlodipine .  BP Readings from Last 3 Encounters:  05/01/24 120/78  07/03/23 124/82  05/12/23 (!) 132/94    Maintain med doses Repeat CMP F/up in 6months  Vitamin D  deficiency Repeat vit. D  Prediabetes Repeat hgbA1c Advised about need for heart healthy diet and daily exercise  Other fatigue Persistent daytime somnolence with loud snoring, witnessed apneic episodes, and hypnic headaches She has hx of obesity, HYPERTENSION and prediabetes No LE edema. Last sleep study 2015: negative OBSTRUCTIVE SLEEP APNEA.(Weight at 200lbs) Current weight 219lbs. CBC 06/2023: normal  Entered ref to sleep specialist Check B12, vit. D, tSh, BMP Encouraged to maintain daily exercise  Reviewed medical, surgical, and social history today  Medications: Outpatient Medications Prior to Visit  Medication Sig   [DISCONTINUED] amLODipine  (NORVASC ) 5 MG tablet Take 1 tablet (5 mg total) by mouth at bedtime. No additional refill without appointment with pcp   [DISCONTINUED] DULoxetine  (CYMBALTA ) 60 MG capsule Take 1 capsule by mouth daily.   [DISCONTINUED] triamterene -hydrochlorothiazide  (MAXZIDE -25) 37.5-25 MG tablet Take 0.5 tablets by mouth daily. No additional refill without appointment with pcp   [DISCONTINUED] Ascorbic Acid (VITAMIN C) 100 MG tablet Take 100 mg by mouth daily.   [DISCONTINUED] ELDERBERRY PO Take by mouth.   [DISCONTINUED] ondansetron  (ZOFRAN -ODT) 4 MG disintegrating tablet 4mg  ODT q4 hours prn nausea/vomit   [DISCONTINUED] Probiotic Product (PROBIOTIC BLEND PO) Take by mouth.   [DISCONTINUED] promethazine  (PHENERGAN ) 12.5 MG tablet Take 1 tablet (12.5 mg total) by mouth  every 6 (six) hours as needed for nausea or vomiting.   [DISCONTINUED] Vitamin D , Ergocalciferol , (DRISDOL ) 1.25 MG (50000 UNIT) CAPS capsule Take 1 capsule (50,000 Units total) by mouth every 7 (seven) days.   No facility-administered medications prior to visit.   Reviewed past medical and social history.   ROS per HPI above      Objective:  BP 120/78   Pulse 94   Temp 97.8 F (36.6 C) (Temporal)   Ht 5\' 6"  (1.676 m)   Wt 219 lb 6.4 oz (99.5 kg)   LMP 08/13/2013 Comment: negative urine pregnancy test 12-2013  SpO2 99%   BMI 35.41 kg/m      Physical Exam Vitals and nursing note reviewed.  Cardiovascular:     Rate and Rhythm: Normal rate and regular rhythm.     Pulses: Normal pulses.     Heart sounds: Normal heart sounds.  Pulmonary:     Effort: Pulmonary effort is normal.     Breath sounds: Normal breath sounds.  Musculoskeletal:     Right lower leg: No edema.     Left lower leg: No edema.  Neurological:     Mental Status: She is alert and oriented to person, place, and time.     No results found for any visits on 05/01/24.    Assessment & Plan:    Problem List Items Addressed This Visit     Elevated LDL cholesterol level - Primary   Relevant Orders   Direct LDL   Essential hypertension   BP at goal with maxzide  and amlodipine .  BP  Readings from Last 3 Encounters:  05/01/24 120/78  07/03/23 124/82  05/12/23 (!) 132/94    Maintain med doses Repeat CMP F/up in 6months      Relevant Medications   amLODipine  (NORVASC ) 5 MG tablet   triamterene -hydrochlorothiazide  (MAXZIDE -25) 37.5-25 MG tablet   Other Relevant Orders   Basic metabolic panel with GFR   Hypnic headache   Relevant Medications   amLODipine  (NORVASC ) 5 MG tablet   DULoxetine  (CYMBALTA ) 60 MG capsule   Other Relevant Orders   Ambulatory referral to Sleep Studies   Loud snoring   Relevant Orders   Ambulatory referral to Sleep Studies   Obesity   Relevant Orders   Ambulatory referral  to Sleep Studies   Other fatigue   Persistent daytime somnolence with loud snoring, witnessed apneic episodes, and hypnic headaches She has hx of obesity, HYPERTENSION and prediabetes No LE edema. Last sleep study 2015: negative OBSTRUCTIVE SLEEP APNEA.(Weight at 200lbs) Current weight 219lbs. CBC 06/2023: normal  Entered ref to sleep specialist Check B12, vit. D, tSh, BMP Encouraged to maintain daily exercise      Relevant Orders   Ambulatory referral to Sleep Studies   B12   Prediabetes   Repeat hgbA1c Advised about need for heart healthy diet and daily exercise      Relevant Orders   Hemoglobin A1c   Recurrent major depressive disorder, in full remission (HCC)   Relevant Medications   DULoxetine  (CYMBALTA ) 60 MG capsule   Vitamin D  deficiency   Repeat vit. D      Relevant Orders   VITAMIN D  25 Hydroxy (Vit-D Deficiency, Fractures)   Other Visit Diagnoses       Enlarged thyroid        Relevant Orders   TSH      Return in about 6 months (around 10/31/2024) for CPE (fasting).     Kathrene Parents, NP

## 2024-05-01 NOTE — Patient Instructions (Signed)
 Go to lab Maintain Heart healthy diet and daily exercise. Maintain current medications.

## 2024-05-01 NOTE — Assessment & Plan Note (Signed)
 Repeat vit D

## 2024-05-01 NOTE — Assessment & Plan Note (Signed)
 BP at goal with maxzide  and amlodipine .  BP Readings from Last 3 Encounters:  05/01/24 120/78  07/03/23 124/82  05/12/23 (!) 132/94    Maintain med doses Repeat CMP F/up in 6months

## 2024-05-01 NOTE — Assessment & Plan Note (Signed)
Repeat hgbA1c ?Advised about need for heart healthy diet and daily exercise ?

## 2024-05-02 ENCOUNTER — Other Ambulatory Visit (HOSPITAL_COMMUNITY): Payer: Self-pay

## 2024-05-02 LAB — BASIC METABOLIC PANEL WITH GFR
BUN: 12 mg/dL (ref 6–23)
CO2: 25 meq/L (ref 19–32)
Calcium: 9.6 mg/dL (ref 8.4–10.5)
Chloride: 103 meq/L (ref 96–112)
Creatinine, Ser: 0.82 mg/dL (ref 0.40–1.20)
GFR: 80.69 mL/min (ref >60.00)
Glucose, Bld: 77 mg/dL (ref 70–99)
Potassium: 4 meq/L (ref 3.5–5.1)
Sodium: 138 meq/L (ref 135–145)

## 2024-05-02 LAB — LDL CHOLESTEROL, DIRECT: Direct LDL: 122 mg/dL

## 2024-05-05 ENCOUNTER — Encounter: Payer: Self-pay | Admitting: Nurse Practitioner

## 2024-05-06 ENCOUNTER — Ambulatory Visit: Payer: Self-pay | Admitting: Nurse Practitioner

## 2024-05-06 NOTE — Telephone Encounter (Signed)
 Requesting: Vitamin D  Last Visit:05/01/24 Next Visit: 10/31/24 Last Refill: 09/24/22 and D/C'd by Kathrene Parents, NP on 05/01/24  Please Advise

## 2024-05-07 ENCOUNTER — Other Ambulatory Visit (HOSPITAL_COMMUNITY): Payer: Self-pay

## 2024-05-07 ENCOUNTER — Ambulatory Visit (INDEPENDENT_AMBULATORY_CARE_PROVIDER_SITE_OTHER): Admitting: Physician Assistant

## 2024-05-07 ENCOUNTER — Telehealth: Payer: Self-pay | Admitting: Pharmacist

## 2024-05-07 ENCOUNTER — Encounter: Payer: Self-pay | Admitting: Physician Assistant

## 2024-05-07 VITALS — BP 117/80 | HR 102 | Ht 67.0 in | Wt 218.0 lb

## 2024-05-07 DIAGNOSIS — Z6834 Body mass index (BMI) 34.0-34.9, adult: Secondary | ICD-10-CM

## 2024-05-07 DIAGNOSIS — E7800 Pure hypercholesterolemia, unspecified: Secondary | ICD-10-CM | POA: Diagnosis not present

## 2024-05-07 DIAGNOSIS — R7303 Prediabetes: Secondary | ICD-10-CM

## 2024-05-07 MED ORDER — TIRZEPATIDE 2.5 MG/0.5ML ~~LOC~~ SOAJ
2.5000 mg | SUBCUTANEOUS | 0 refills | Status: DC
Start: 2024-05-07 — End: 2024-09-22
  Filled 2024-05-07 – 2024-05-21 (×3): qty 2, 28d supply, fill #0

## 2024-05-07 MED ORDER — VITAMIN D (ERGOCALCIFEROL) 1.25 MG (50000 UNIT) PO CAPS
50000.0000 [IU] | ORAL_CAPSULE | ORAL | 0 refills | Status: DC
  Filled 2024-05-07: qty 4, 28d supply, fill #0

## 2024-05-07 MED ORDER — TIRZEPATIDE 5 MG/0.5ML ~~LOC~~ SOAJ
5.0000 mg | SUBCUTANEOUS | 0 refills | Status: DC
Start: 2024-06-04 — End: 2024-09-22
  Filled 2024-05-07: qty 2, 28d supply, fill #0

## 2024-05-07 NOTE — Progress Notes (Signed)
 Patient ID: NEENAH CANTER, female   DOB: 1971-05-01, 53 y.o.   MRN: [id number]   Kahlea Cobert, is a 53 y.o. female  ZDG:[id number]  [id number]  DOB - [birth year]  Chief Complaint  Patient presents with   Weight Management Screening       Subjective:   Skyleen Bentley is a 53 y.o. female here today for a follow up visit after PCP did some lab work and A1C came back at 6.4.  she is wanting to take mounjaro(wegovy , zepbound or) one of the GLP1 that help with glucose and weight loss.  She is planning to incorporate healthier diet and exercise as well.  She has tried lifestyle changes in the past and nothing is seeming to work.  FH is unknown and she is concerned about her health and weight.     No problems updated.  ALLERGIES: Allergies  Allergen Reactions   Penicillins Rash    PAST MEDICAL HISTORY: Past Medical History:  Diagnosis Date   Anemia    Arthritis    Colon polyp 12/25/2011   Tubular adenomatous   Depression    following death of mother   Duodenitis    DVT (deep vein thrombosis) in pregnancy    Esophagitis    Fatigue    Gallstones    GERD (gastroesophageal reflux disease)    Hemorrhagic ovarian cyst 05/22/2012   04/2012    Hiatal hernia    Infection    UTI   Obesity    PUD (peptic ulcer disease)    Sacroiliac pain    Vaginal Pap smear, abnormal    colpo, results ok   Vitamin D  deficiency     MEDICATIONS AT HOME: Prior to Admission medications   Medication Sig Start Date End Date Taking? Authorizing Provider  amLODipine  (NORVASC ) 5 MG tablet Take 1 tablet (5 mg total) by mouth at bedtime. 05/01/24  Yes Nche, Connye Delaine, NP  DULoxetine  (CYMBALTA ) 60 MG capsule Take 1 capsule by mouth daily. 05/01/24  Yes Nche, Connye Delaine, NP  tirzepatide (MOUNJARO) 2.5 MG/0.5ML Pen Inject 2.5 mg into the skin once a week. 05/07/24  Yes Klaira Pesci, Stan Eans, PA-C  tirzepatide [facility]) 5 MG/0.5ML Pen Inject 5 mg into the skin once a week. 06/04/24  Yes Dulce Gibbs  M, PA-C  triamterene -hydrochlorothiazide  (MAXZIDE -25) 37.5-25 MG tablet Take 0.5 tablets by mouth daily. 05/01/24  Yes Nche, Connye Delaine, NP  Vitamin D , Ergocalciferol , (DRISDOL ) 1.25 MG (50000 UNIT) CAPS capsule Take 1 capsule (50,000 Units total) by mouth every 7 (seven) days. 05/07/24  Yes McElwee, Lauren A, NP    ROS: Neg HEENT Neg resp Neg cardiac Neg GI Neg GU Neg MS Neg psych Neg neuro  Objective:   Vitals:   05/07/24 1056  BP: 117/80  Pulse: (!) 102  SpO2: 98%  Weight: 218 lb (98.9 kg)  Height: 5' 7 (1.702 m)   Exam General appearance : Awake, alert, not in any distress. Speech Clear. Not toxic looking HEENT: Atraumatic and Normocephalic Chest: Good air entry bilaterally, CTAB.  No rales/rhonchi/wheezing CVS: S1 S2 regular, no murmurs.  Extremities: B/L Lower Ext shows no edema Neurology: Awake alert, and oriented X 3, CN II-XII intact, Non focal Skin: No Rash  Data Review Lab Results  Component Value Date   HGBA1C 6.4 05/01/2024   HGBA1C 5.8 (A) 01/23/2023   HGBA1C 6.1 (H) 09/22/2022    Assessment & Plan   1. Prediabetes (Primary) Work at a goal of eliminating sugary drinks, candy, desserts, sweets, refined  sugars, processed foods, and white carbohydrates.   -make small sustainable goals like walking 10 mins daily then adding to it.   Consume enough protein daily and make healthy habits.  Drink 64 to 80 ounces water daily - tirzepatide (MOUNJARO) 2.5 MG/0.5ML Pen; Inject 2.5 mg into the skin once a week.  Dispense: 2 mL; Refill: 0 - tirzepatide (MOUNJARO) 5 MG/0.5ML Pen; Inject 5 mg into the skin once a week.  Dispense: 2 mL; Refill: 0 For next month  2. Elevated LDL cholesterol level - tirzepatide (MOUNJARO) 2.5 MG/0.5ML Pen; Inject 2.5 mg into the skin once a week.  Dispense: 2 mL; Refill: 0 - tirzepatide (MOUNJARO) 5 MG/0.5ML Pen; Inject 5 mg into the skin once a week.  Dispense: 2 mL; Refill: 0  3. BMI 34.0-34.9,adult - tirzepatide (MOUNJARO) 2.5  MG/0.5ML Pen; Inject 2.5 mg into the skin once a week.  Dispense: 2 mL; Refill: 0 - tirzepatide (MOUNJARO) 5 MG/0.5ML Pen; Inject 5 mg into the skin once a week.  Dispense: 2 mL; Refill: 0(next month)  Reviewed recent labs.  See PCP in about 2 months for next titration    The patient was given clear instructions to go to ER or return to medical center if symptoms don't improve, worsen or new problems develop. The patient verbalized understanding. The patient was told to call to get lab results if they haven't heard anything in the next week.      Dulce Gibbs, PA-C [facility] [place], [place] [phone]   05/07/2024, 11:29 AM

## 2024-05-07 NOTE — Telephone Encounter (Signed)
 Received PA request from patient's pharmacy for the Mounjaro. Routing request to RX prior auth team.

## 2024-05-08 ENCOUNTER — Other Ambulatory Visit (HOSPITAL_COMMUNITY): Payer: Self-pay

## 2024-05-08 ENCOUNTER — Telehealth: Payer: Self-pay

## 2024-05-08 NOTE — Telephone Encounter (Signed)
 PA request has been Received. New Encounter has been or will be created for follow up. For additional info see Pharmacy Prior Auth telephone encounter from 05/08/24.

## 2024-05-08 NOTE — Telephone Encounter (Signed)
 Pharmacy Patient Advocate Encounter   Received notification from Pt Calls Messages that prior authorization for MOUNJARO is required/requested.   Ozempic Florence Hunt is approved exclusively as an adjunct to diet and exercise to improve glycemic  control in adults with type 2 diabetes mellitus. A review of patient's medical chart reveals no  documented diagnosis of type 2 diabetes or an A1C indicative of diabetes. Therefore, they do not  currently meet the criteria for prior authorization of this medication.  Patient has insurance through [employer]. Unfortunately, [facility]'s insurance does not cover any GLP-1  for weight loss.

## 2024-05-21 ENCOUNTER — Other Ambulatory Visit (HOSPITAL_COMMUNITY): Payer: Self-pay

## 2024-05-23 ENCOUNTER — Telehealth: Payer: Self-pay | Admitting: Nurse Practitioner

## 2024-05-23 NOTE — Telephone Encounter (Signed)
 Copied from CRM [id number]. Topic: Referral - Prior Authorization Question >> May 21, 2024  9:54 AM Robinson H wrote: Reason for CRM: Patient calling to check status of prior auth for tirzepatide  (MOUNJARO ) 5 MG/0.5ML Pen, wondering if she can have a prescription for Wegovy  0.5 mg if she can't have the Mounjaro . Patient states is Wegovy  will be prescribed it can be sent to the [facility] pharmacy on file, patient states she is at work if she doesn't answer you can send a Mychart message.  Baili [phone]

## 2024-06-05 ENCOUNTER — Ambulatory Visit: Admitting: Neurology

## 2024-06-05 ENCOUNTER — Encounter: Payer: Self-pay | Admitting: Neurology

## 2024-06-05 VITALS — BP 125/78 | HR 83 | Ht 67.0 in | Wt 222.0 lb

## 2024-06-05 DIAGNOSIS — R0681 Apnea, not elsewhere classified: Secondary | ICD-10-CM

## 2024-06-05 DIAGNOSIS — R0683 Snoring: Secondary | ICD-10-CM

## 2024-06-05 DIAGNOSIS — G4719 Other hypersomnia: Secondary | ICD-10-CM | POA: Diagnosis not present

## 2024-06-05 DIAGNOSIS — Z9189 Other specified personal risk factors, not elsewhere classified: Secondary | ICD-10-CM | POA: Diagnosis not present

## 2024-06-05 DIAGNOSIS — R635 Abnormal weight gain: Secondary | ICD-10-CM | POA: Diagnosis not present

## 2024-06-05 DIAGNOSIS — E66811 Obesity, class 1: Secondary | ICD-10-CM

## 2024-06-05 DIAGNOSIS — R519 Headache, unspecified: Secondary | ICD-10-CM

## 2024-06-05 NOTE — Patient Instructions (Signed)

## 2024-06-05 NOTE — Progress Notes (Signed)
 Subjective:    Patient ID: Rebecca [name] is a 53 y.o. female.  HPI    True Mar, MD, PhD [facility] [address] [address] [place], [place] [address]  Dear Roselie,  I saw your patient, Rebecca [name], upon your kind request in my sleep clinic today for initial consultation of her sleep disorder, in particular, concern for underlying obstructive sleep apnea.  The patient is unaccompanied today.  As you know, Rebecca [name] is a 53 year old female with an underlying medical history of prediabetes, prior smoking, hyperlipidemia, vitamin D  deficiency, hypertension, enlarged thyroid , anemia, arthritis, DVT, reflux disease, peptic ulcer disease, cholelithiasis, duodenitis, and obesity, who reports snoring and excessive daytime somnolence as well as witnessed apneas. Her Epworth sleepiness score is 9 out of 24, fatigue severity score is 50 out of 63.  She had a sleep study several years ago and was not diagnosed with obstructive sleep apnea at the time.  Her weight was 200 pounds at the time for a BMI of 31.3.  She had some interim weight gain in the past 10 years.  Overall AHI was borderline at 4.9/h.  I was able to review her baseline sleep study results from 06/17/2014.  O2 nadir was 87%.  She has never been on PAP therapy.  She is working on weight loss.  She lives with her husband, snoring is loud at times.  She has no nightly nocturia but has had occasional morning headaches.  Bedtime is around midnight and rise time around 6:30 AM.  She works as a Engineer, site in Industrial/product designer.  She quit smoking in 2024, she drinks alcohol rarely, she drinks caffeine in the form of coffee, about 3 cups/day.  Her Past Medical History Is Significant For: Past Medical History:  Diagnosis Date   Anemia    Arthritis    Colon polyp 12/25/2011   Tubular adenomatous   Depression    following death of mother   Duodenitis    DVT (deep vein thrombosis) in pregnancy     Esophagitis    Fatigue    Gallstones    GERD (gastroesophageal reflux disease)    Hemorrhagic ovarian cyst 05/22/2012   04/2012    Hiatal hernia    High cholesterol    Hypertension    Infection    UTI   Obesity    PUD (peptic ulcer disease)    Sacroiliac pain    Vaginal Pap smear, abnormal    colpo, results ok   Vitamin D  deficiency     Her Past Surgical History Is Significant For: Past Surgical History:  Procedure Laterality Date   BREAST EXCISIONAL BIOPSY Left    benign   COLONOSCOPY     COLPOSCOPY  2012   NEG   DILATATION & CURETTAGE/HYSTEROSCOPY WITH MYOSURE N/A 01/17/2017   Procedure: DILATATION & CURETTAGE/HYSTEROSCOPY WITH MYOSURE;  Surgeon: Dickie Carder, MD;  Location: [facility];  Service: Gynecology;  Laterality: N/A;   POLYPECTOMY     TUBAL LIGATION      Her Family History Is Significant For: Family History  Problem Relation Age of Onset   Aneurysm Mother    Heart disease Mother    Hypertension Mother    Stroke Mother    Lupus Mother    Diabetes Maternal Aunt    Cancer Maternal Aunt    Colon cancer Neg Hx    Esophageal cancer Neg Hx    Stomach cancer Neg Hx    Rectal cancer Neg Hx  Colon polyps Neg Hx    Sleep apnea Neg Hx     Her Social History Is Significant For: Social History   Socioeconomic History   Marital status: Married    Spouse name: Not on file   Number of children: 2   Years of education: Not on file   Highest education level: Associate degree: academic program  Occupational History   Occupation: NSNT    Employer: [employer]  Tobacco Use   Smoking status: Former    Current packs/day: 0.00    Average packs/day: 0.5 packs/day for 5.0 years (2.5 ttl pk-yrs)    Types: Cigarettes, E-cigarettes    Start date: 09/14/2017    Quit date: 09/14/2022    Years since quitting: 1.7   Smokeless tobacco: Never   Tobacco comments:    4 cigs a day   Vaping Use   Vaping status: Every Day   Start date: 08/27/2022   Substances:  Flavoring  Substance and Sexual Activity   Alcohol use: Yes    Comment: occasional glass of wine   Drug use: No   Sexual activity: Yes    Birth control/protection: Post-menopausal  Other Topics Concern   Not on file  Social History Narrative   Caffeine: maybe 3 cups/day during the week    Right handed   Lives at home with Exilda Wilhite (husband)   Social Drivers of Health   Financial Resource Strain: Low Risk  (04/29/2024)   Overall Financial Resource Strain (CARDIA)    Difficulty of Paying Living Expenses: Not hard at all  Food Insecurity: No Food Insecurity (04/29/2024)   Hunger Vital Sign    Worried About Running Out of Food in the Last Year: Never true    Ran Out of Food in the Last Year: Never true  Transportation Needs: No Transportation Needs (04/29/2024)   PRAPARE - Administrator, Civil Service (Medical): No    Lack of Transportation (Non-Medical): No  Physical Activity: Insufficiently Active (04/29/2024)   Exercise Vital Sign    Days of Exercise per Week: 1 day    Minutes of Exercise per Session: 10 min  Stress: No Stress Concern Present (04/29/2024)   [employer] of Occupational Health - Occupational Stress Questionnaire    Feeling of Stress : Not at all  Social Connections: Moderately Integrated (04/29/2024)   Social Connection and Isolation Panel    Frequency of Communication with Friends and Family: More than three times a week    Frequency of Social Gatherings with Friends and Family: More than three times a week    Attends Religious Services: More than 4 times per year    Active Member of [employer] or Organizations: No    Attends Engineer, structural: Not on file    Marital Status: Married    Her Allergies Are:  Allergies  Allergen Reactions   Penicillins Rash  :   Her Current Medications Are:  Outpatient Encounter Medications as of 06/05/2024  Medication Sig   amLODipine  (NORVASC ) 5 MG tablet Take 1 tablet (5 mg total) by mouth at bedtime.    DULoxetine  (CYMBALTA ) 60 MG capsule Take 1 capsule by mouth daily.   triamterene -hydrochlorothiazide  (MAXZIDE -25) 37.5-25 MG tablet Take 0.5 tablets by mouth daily.   Vitamin D , Ergocalciferol , (DRISDOL ) 1.25 MG (50000 UNIT) CAPS capsule Take 1 capsule (50,000 Units total) by mouth every 7 (seven) days.   tirzepatide  (MOUNJARO ) 2.5 MG/0.5ML Pen Inject 2.5 mg into the skin once a week.   tirzepatide  (  MOUNJARO ) 5 MG/0.5ML Pen Inject 5 mg into the skin once a week.   No facility-administered encounter medications on file as of 06/05/2024.  :   Review of Systems:  Out of a complete 14 point review of systems, all are reviewed and negative with the exception of these symptoms as listed below:   Review of Systems  Neurological:        Patient is here alone for sleep consult. She had a sleep study 15 years ago at [facility]. She was under the impression she did not have sleep apnea but has had ongoing issues. She most recently informed that she did have sleep apnea so she was referred here. She states she snores. She is tired all of the time. She wakes up with headaches. She is not aware of any family hx of sleep apnea. ESS 9 FSS 50    Objective:  Neurological Exam  Physical Exam Physical Examination:   Vitals:   06/05/24 1417  BP: 125/78  Pulse: 83    General Examination: The patient is a very pleasant 53 y.o. female in no acute distress. She appears well-developed and well-nourished and well groomed.   HEENT: Normocephalic, atraumatic, pupils are equal, round and reactive to light, extraocular tracking is good without limitation to gaze excursion or nystagmus noted. Hearing is grossly intact. Face is symmetric with normal facial animation. Speech is clear with no dysarthria noted. There is no hypophonia. There is no lip, neck/head, jaw or voice tremor. Neck is supple with full range of passive and active motion. There are no carotid bruits on auscultation. Oropharynx exam reveals: mild mouth  dryness, adequate dental hygiene with dentures on top and nearly edentulous on the bottom with mild airway crowding secondary to small airway opening, smaller tonsils, Mallampati class II, elongated tongue.  Tongue protrudes centrally and palate elevates symmetrically, neck circumference 14-7/8 inches.    Chest: Clear to auscultation without wheezing, rhonchi or crackles noted.  Heart: S1+S2+0, regular and normal without murmurs, rubs or gallops noted.   Abdomen: Soft, non-tender and non-distended.  Extremities: There is no pitting edema in the distal lower extremities bilaterally.   Skin: Warm and dry without trophic changes noted.   Musculoskeletal: exam reveals no obvious joint deformities.   Neurologically:  Mental status: The patient is awake, alert and oriented in all 4 spheres. Her immediate and remote memory, attention, language skills and fund of knowledge are appropriate. There is no evidence of aphasia, agnosia, apraxia or anomia. Speech is clear with normal prosody and enunciation. Thought process is linear. Mood is normal and affect is normal.  Cranial nerves II - XII are as described above under HEENT exam.  Motor exam: Normal bulk, strength and tone is noted. There is no obvious action or resting tremor.  Fine motor skills and coordination: grossly intact.  Cerebellar testing: No dysmetria or intention tremor. There is no truncal or gait ataxia.  Sensory exam: intact to light touch in the upper and lower extremities.  Gait, station and balance: She stands easily. No veering to one side is noted. No leaning to one side is noted. Posture is age-appropriate and stance is narrow based. Gait shows normal stride length and normal pace. No problems turning are noted.   Assessment and plan:  In summary, Rebecca [name] is a very pleasant 53 year old female with an underlying medical history of prediabetes, prior smoking, hyperlipidemia, vitamin D  deficiency, hypertension, enlarged  thyroid , anemia, arthritis, DVT, reflux disease, peptic ulcer disease, cholelithiasis, duodenitis, and  obesity, whose history and physical exam are concerning for sleep disordered breathing, particularly obstructive sleep apnea (OSA). A laboratory attended sleep study is typically considered gold standard for evaluation of sleep disordered breathing.   I had a long chat with the patient about my findings and the diagnosis of sleep apnea, particularly OSA, its prognosis and treatment options. We talked about medical/conservative treatments, surgical interventions and non-pharmacological approaches for symptom control. I explained, in particular, the risks and ramifications of untreated moderate to severe OSA, especially with respect to developing cardiovascular disease down the road, including congestive heart failure (CHF), difficult to treat hypertension, cardiac arrhythmias (particularly A-fib), neurovascular complications including TIA, stroke and dementia. Even type 2 diabetes has, in part, been linked to untreated OSA. Symptoms of untreated OSA may include (but may not be limited to) daytime sleepiness, nocturia (i.e. frequent nighttime urination), memory problems, mood irritability and suboptimally controlled or worsening mood disorder such as depression and/or anxiety, lack of energy, lack of motivation, physical discomfort, as well as recurrent headaches, especially morning or nocturnal headaches. We talked about the importance of maintaining a healthy lifestyle and striving for healthy weight. In addition, we talked about the importance of striving for and maintaining good sleep hygiene. I recommended a sleep study at this time. I outlined the differences between a laboratory attended sleep study which is considered more comprehensive and accurate over the option of a home sleep test (HST); the latter may lead to underestimation of sleep disordered breathing in some instances and does not help with  diagnosing upper airway resistance syndrome and is not accurate enough to diagnose primary central sleep apnea typically. I outlined possible surgical and non-surgical treatment options of OSA, including the use of a positive airway pressure (PAP) device (i.e. CPAP, AutoPAP/APAP or BiPAP in certain circumstances), a custom-made dental device (aka oral appliance, which would require a referral to a specialist dentist or orthodontist typically, and is generally speaking not considered for patients with full dentures or edentulous state), upper airway surgical options, such as traditional UPPP (which is not considered a first-line treatment) or the Inspire device (hypoglossal nerve stimulator, which would involve a referral for consultation with an ENT surgeon, after careful selection, following inclusion criteria - also not first-line treatment). I explained the PAP treatment option to the patient in detail, as this is generally considered first-line treatment.  The patient indicated that she would be willing to try PAP therapy, if the need arises. I explained the importance of being compliant with PAP treatment, not only for insurance purposes but primarily to improve patient's symptoms symptoms, and for the patient's long term health benefit, including to reduce Her cardiovascular risks longer-term.    We will pick up our discussion about the next steps and treatment options after testing.  We will keep her posted as to the test results by phone call and/or MyChart messaging where possible.  We will plan to follow-up in sleep clinic accordingly as well.  I answered all her questions today and the patient was in agreement.   I encouraged her to call with any interim questions, concerns, problems or updates or email us  through MyChart.  Generally speaking, sleep test authorizations may take up to 2 weeks, sometimes less, sometimes longer, the patient is encouraged to get in touch with us  if they do not hear back  from the sleep lab staff directly within the next 2 weeks.  Thank you very much for allowing me to participate in the care of this nice  patient. If I can be of any further assistance to you please do not hesitate to call me at [phone].  Sincerely,   True Mar, MD, PhD

## 2024-06-13 ENCOUNTER — Telehealth: Payer: Self-pay | Admitting: Neurology

## 2024-06-13 NOTE — Telephone Encounter (Signed)
 06/13/24 sent mychart  06/12/24 [facility] aetna no shara req

## 2024-06-26 ENCOUNTER — Encounter: Admitting: Nurse Practitioner

## 2024-07-08 ENCOUNTER — Other Ambulatory Visit: Payer: Self-pay | Admitting: Nurse Practitioner

## 2024-07-08 ENCOUNTER — Other Ambulatory Visit: Payer: Self-pay

## 2024-07-08 ENCOUNTER — Other Ambulatory Visit (HOSPITAL_COMMUNITY): Payer: Self-pay

## 2024-07-08 MED ORDER — VITAMIN D (ERGOCALCIFEROL) 1.25 MG (50000 UNIT) PO CAPS
50000.0000 [IU] | ORAL_CAPSULE | ORAL | 0 refills | Status: AC
  Filled 2024-07-08 – 2024-09-15 (×2): qty 4, 28d supply, fill #0

## 2024-07-11 ENCOUNTER — Encounter

## 2024-07-14 ENCOUNTER — Ambulatory Visit (INDEPENDENT_AMBULATORY_CARE_PROVIDER_SITE_OTHER): Admitting: Neurology

## 2024-07-14 DIAGNOSIS — R0683 Snoring: Secondary | ICD-10-CM

## 2024-07-14 DIAGNOSIS — G4720 Circadian rhythm sleep disorder, unspecified type: Secondary | ICD-10-CM

## 2024-07-14 DIAGNOSIS — E66811 Obesity, class 1: Secondary | ICD-10-CM

## 2024-07-14 DIAGNOSIS — G4719 Other hypersomnia: Secondary | ICD-10-CM

## 2024-07-14 DIAGNOSIS — R0681 Apnea, not elsewhere classified: Secondary | ICD-10-CM

## 2024-07-14 DIAGNOSIS — G4733 Obstructive sleep apnea (adult) (pediatric): Secondary | ICD-10-CM

## 2024-07-14 DIAGNOSIS — Z9189 Other specified personal risk factors, not elsewhere classified: Secondary | ICD-10-CM

## 2024-07-14 DIAGNOSIS — R635 Abnormal weight gain: Secondary | ICD-10-CM

## 2024-07-14 DIAGNOSIS — R519 Headache, unspecified: Secondary | ICD-10-CM

## 2024-07-16 NOTE — Procedures (Signed)
 Physician Interpretation: Please see link under Procedure Tab or under Encounters tab for physician report, technical report, as well as O2 titration and/or PAP titration tables (if applicable).   Referred by: Dr. Modena Callander   History and Indication for Testing (obtained from visit note dated 05/29/2024): 53 year old female with an underlying complex medical history of stroke in May 2025, hypertension, hyperlipidemia, coronary artery disease, prior smoking, COPD, history of kidney stone, carotid artery disease with status post bilateral carotid endarterectomies, hypothyroidism, and overweight state, who reports snoring and nocturia. His Epworth sleepiness score is 3 out of 24, fatigue severity score is 15 out of 63.     Review of the EEG showed no abnormal electrical discharges and symmetrical bihemispheric findings.     EKG: The EKG revealed normal sinus rhythm (NSR). ***   AUDIO/VIDEO REVIEW: The audio and video review did not show any abnormal or unusual behaviors, movements, phonations or vocalizations. The patient took *** restroom breaks. Snoring was noted, ***   POST-STUDY QUESTIONNAIRE: Post study, the patient indicated, that sleep was *** the same as usual.    IMPRESSION:    Obstructive Sleep Apnea (OSA), *** ***Central Sleep Apnea (CSA) ***Primary Snoring ***Primary Central Sleep Apnea ***Complex Sleep Apnea ***PLMD (periodic limb movement disorder [of sleep]) ***Dysfunctions associated with sleep stages or arousal from sleep ***Non-specific abnormal electrocardiogram (EKG) ***Poor sleep pattern ***Inconclusive Test   RECOMMENDATIONS:         I certify that I have reviewed the entire raw data recording prior to the issuance of this report in accordance with the Standards of Accreditation of the American Academy of Sleep Medicine (AASM).   True Mar, MD, PhD Medical Director, [facility]) Diplomat, ABPN (Neurology and  Sleep)

## 2024-07-17 ENCOUNTER — Other Ambulatory Visit (HOSPITAL_COMMUNITY): Payer: Self-pay

## 2024-07-22 ENCOUNTER — Ambulatory Visit: Payer: Self-pay | Admitting: Neurology

## 2024-07-22 DIAGNOSIS — G4733 Obstructive sleep apnea (adult) (pediatric): Secondary | ICD-10-CM

## 2024-07-22 NOTE — Telephone Encounter (Signed)
Lm vm to call back

## 2024-07-23 NOTE — Telephone Encounter (Signed)
-----   Message from True Mar sent at 07/22/2024  1:35 PM EDT ----- Patient referred by PCP, seen by me on 06/05/24, diagnostic PSG on 07/14/24.    Please call and notify the patient that the recent sleep study showed moderate to severe obstructive sleep apnea. I recommend treatment for in the form of autoPAP. We may consider at a CPAP titration  study at a later date, if need be, which means, that we would ask her to come back in for a second sleep study with CPAP treatment. For now, I would like to start her on a so-called autoPAP machine  at home, through a DME company (of her choice, or as per insurance requirement). The DME representative will educate her on how to use the machine, how to put the mask on, etc. I have placed an order  in the chart. Please send referral, talk to patient, send report to referring MD. We will need a FU in sleep clinic for 10 weeks post-PAP set up, please arrange that with me or one of our NPs.  Thanks,   True Mar, MD, PhD [facility])    ----- Message ----- From: Mar True, MD Sent: 07/22/2024   1:33 PM EDT To: True Mar, MD

## 2024-07-23 NOTE — Telephone Encounter (Signed)
 I called pt and relayed results of sleep study to pt. Moderate to Severe OSA.  Recommend treatment in form of autopap, maybe later do a cpap titration study but for now autopap. DME [facility] will authorize machine, show you how to use, clean, supplies and fit for mask. Use min 4 hr every night but more medically is better every night.  We see back in office for insurance compliance appt. Made 10-14-2024 at 1245 with SS/NP.  Will send to pt in mychart she was fine with that.  She verbalized understanding of plan.  She did make mention that when she was in for her sleep test she did have herself tested for covid the next day as was so fatigued (had congestion).  She thought was just because got back from beach.  She did not know if that would have made difference in her test results.  I told her I did not think it would.  I would relay to Dr. Buck just as a FYI.

## 2024-07-23 NOTE — Telephone Encounter (Signed)
-----   Message ----- From: Neysa Nena RAMAN, RN Sent: 07/23/2024  11:25 AM EDT To: Madelin Donnice Verneita Darrel; Glade Zott Subject: new autopap user mod severe osa                New order in EPIC  Rebecca [name] Female, 53 y.o., 1971-10-01 Pronouns: she/her/hers MRN: [id number] Phone: [phone] (M)  Thank you, Particia RN

## 2024-08-08 ENCOUNTER — Ambulatory Visit: Admitting: Nurse Practitioner

## 2024-08-13 ENCOUNTER — Ambulatory Visit: Admitting: Nurse Practitioner

## 2024-08-26 DIAGNOSIS — Z01419 Encounter for gynecological examination (general) (routine) without abnormal findings: Secondary | ICD-10-CM | POA: Diagnosis not present

## 2024-08-26 DIAGNOSIS — E785 Hyperlipidemia, unspecified: Secondary | ICD-10-CM | POA: Diagnosis not present

## 2024-08-26 DIAGNOSIS — R7309 Other abnormal glucose: Secondary | ICD-10-CM | POA: Diagnosis not present

## 2024-08-26 DIAGNOSIS — H04123 Dry eye syndrome of bilateral lacrimal glands: Secondary | ICD-10-CM | POA: Diagnosis not present

## 2024-08-26 DIAGNOSIS — H524 Presbyopia: Secondary | ICD-10-CM | POA: Diagnosis not present

## 2024-08-26 DIAGNOSIS — H43393 Other vitreous opacities, bilateral: Secondary | ICD-10-CM | POA: Diagnosis not present

## 2024-08-26 DIAGNOSIS — F418 Other specified anxiety disorders: Secondary | ICD-10-CM | POA: Diagnosis not present

## 2024-08-26 DIAGNOSIS — I10 Essential (primary) hypertension: Secondary | ICD-10-CM | POA: Diagnosis not present

## 2024-08-26 DIAGNOSIS — H35033 Hypertensive retinopathy, bilateral: Secondary | ICD-10-CM | POA: Diagnosis not present

## 2024-08-26 LAB — HM DIABETES EYE EXAM

## 2024-09-04 ENCOUNTER — Telehealth: Payer: Self-pay | Admitting: *Deleted

## 2024-09-04 NOTE — Telephone Encounter (Signed)
 Receive fax from [facility] that they had attempted to contact pt concerning autopap.  She has not returned call and therefore they  have cancelled order.  Sent mychart message to pt to give them a call.

## 2024-09-10 ENCOUNTER — Ambulatory Visit: Admitting: Nurse Practitioner

## 2024-09-15 ENCOUNTER — Other Ambulatory Visit (HOSPITAL_COMMUNITY): Payer: Self-pay

## 2024-09-16 ENCOUNTER — Other Ambulatory Visit: Payer: Self-pay

## 2024-09-16 ENCOUNTER — Encounter: Payer: Self-pay | Admitting: Pharmacist

## 2024-09-19 ENCOUNTER — Other Ambulatory Visit: Payer: Self-pay

## 2024-09-22 ENCOUNTER — Other Ambulatory Visit (HOSPITAL_COMMUNITY): Payer: Self-pay

## 2024-09-22 ENCOUNTER — Telehealth (HOSPITAL_COMMUNITY): Payer: Self-pay

## 2024-09-22 ENCOUNTER — Ambulatory Visit (INDEPENDENT_AMBULATORY_CARE_PROVIDER_SITE_OTHER): Admitting: Nurse Practitioner

## 2024-09-22 VITALS — BP 126/78 | HR 85 | Temp 99.3°F | Ht 66.0 in | Wt 216.2 lb

## 2024-09-22 DIAGNOSIS — G4733 Obstructive sleep apnea (adult) (pediatric): Secondary | ICD-10-CM | POA: Diagnosis not present

## 2024-09-22 DIAGNOSIS — E6609 Other obesity due to excess calories: Secondary | ICD-10-CM | POA: Diagnosis not present

## 2024-09-22 DIAGNOSIS — E66811 Obesity, class 1: Secondary | ICD-10-CM | POA: Diagnosis not present

## 2024-09-22 DIAGNOSIS — Z6834 Body mass index (BMI) 34.0-34.9, adult: Secondary | ICD-10-CM | POA: Diagnosis not present

## 2024-09-22 DIAGNOSIS — I10 Essential (primary) hypertension: Secondary | ICD-10-CM | POA: Diagnosis not present

## 2024-09-22 DIAGNOSIS — R7303 Prediabetes: Secondary | ICD-10-CM

## 2024-09-22 LAB — POCT GLYCOSYLATED HEMOGLOBIN (HGB A1C): Hemoglobin A1C: 5.8 % — AB (ref 4.0–5.6)

## 2024-09-22 MED ORDER — PHENTERMINE-TOPIRAMATE ER 3.75-23 MG PO CP24
1.0000 | ORAL_CAPSULE | Freq: Every morning | ORAL | 0 refills | Status: AC
  Filled 2024-09-22: qty 30, 30d supply, fill #0
  Filled 2024-10-01 – 2024-10-29 (×2): qty 14, 14d supply, fill #0
  Filled 2024-10-29: qty 30, 30d supply, fill #0

## 2024-09-22 NOTE — Assessment & Plan Note (Signed)
 Repeat hgbA1c: 5.8% improved from 6.4%

## 2024-09-22 NOTE — Assessment & Plan Note (Addendum)
 Insurance does not cover GLP-1RA injection. Unable to use contrave due to current use of cymbalta . Diet: she has cut down on sugary drinks and high carb foods Exercise walking 3x/week Wt Readings from Last 3 Encounters:  09/22/24 216 lb 3.2 oz (98.1 kg)  06/05/24 222 lb (100.7 kg)  05/07/24 218 lb (98.9 kg)    She declined referral to nutritionist at this time due to work schedule. We discussed ways to substitute high carb meals, and suggestions on high protein snacks, the importance of at least 2 balanced meals daily We discussed use of Qsymia and possible adverse effects.  She verbalized understanding and agreed to start med. Prescription sent F/up in 23month

## 2024-09-22 NOTE — Progress Notes (Signed)
 Established Patient Visit  Patient: Rebecca [name]   DOB: 1971/11/24   53 y.o. Female  MRN: [id number] Visit Date: 09/22/2024  Subjective:    Chief Complaint  Patient presents with   Weight Management    HPI Obesity Insurance does not cover GLP-1RA injection. Unable to use contrave due to current use of cymbalta . Diet: she has cut down on sugary drinks and high carb foods Exercise walking 3x/week Wt Readings from Last 3 Encounters:  09/22/24 216 lb 3.2 oz (98.1 kg)  06/05/24 222 lb (100.7 kg)  05/07/24 218 lb (98.9 kg)    She declined referral to nutritionist at this time due to work schedule. We discussed ways to substitute high carb meals, and suggestions on high protein snacks, the importance of at least 2 balanced meals daily We discussed use of Qsymia and possible adverse effects.  She verbalized understanding and agreed to start med. Prescription sent F/up in 67month  Prediabetes Repeat hgbA1c: 5.8% improved from 6.4%  Reviewed medical, surgical, and social history today  Medications: Outpatient Medications Prior to Visit  Medication Sig   amLODipine  (NORVASC ) 5 MG tablet Take 1 tablet (5 mg total) by mouth at bedtime.   DULoxetine  (CYMBALTA ) 60 MG capsule Take 1 capsule by mouth daily.   triamterene -hydrochlorothiazide  (MAXZIDE -25) 37.5-25 MG tablet Take 0.5 tablets by mouth daily.   Vitamin D , Ergocalciferol , (DRISDOL ) 1.25 MG (50000 UNIT) CAPS capsule Take 1 capsule (50,000 Units total) by mouth every 7 (seven) days.   [DISCONTINUED] tirzepatide  (MOUNJARO ) 2.5 MG/0.5ML Pen Inject 2.5 mg into the skin once a week.   [DISCONTINUED] tirzepatide  (MOUNJARO ) 5 MG/0.5ML Pen Inject 5 mg into the skin once a week.   No facility-administered medications prior to visit.   Reviewed past medical and social history.   ROS per HPI above  Last metabolic panel Lab Results  Component Value Date   GLUCOSE 77 05/01/2024   NA 138 05/01/2024   K 4.0  05/01/2024   CL 103 05/01/2024   CO2 25 05/01/2024   BUN 12 05/01/2024   CREATININE 0.82 05/01/2024   GFR 80.69 05/01/2024   CALCIUM 9.6 05/01/2024   PROT 6.6 07/03/2023   ALBUMIN 4.2 07/03/2023   LABGLOB 2.4 04/11/2022   AGRATIO 1.7 04/11/2022   BILITOT 0.4 07/03/2023   ALKPHOS 77 07/03/2023   AST 19 07/03/2023   ALT 20 07/03/2023   ANIONGAP 8 05/12/2023   Last lipids Lab Results  Component Value Date   CHOL 198 04/17/2023   HDL 58.60 04/17/2023   LDLCALC 124 (H) 04/17/2023   LDLDIRECT 122.0 05/01/2024   TRIG 80.0 04/17/2023   CHOLHDL 3 04/17/2023   Last hemoglobin A1c Lab Results  Component Value Date   HGBA1C 5.8 (A) 09/22/2024   Last thyroid  functions Lab Results  Component Value Date   TSH 1.20 05/01/2024   Last vitamin D  Lab Results  Component Value Date   VD25OH 26.37 (L) 05/01/2024        Objective:  BP 126/78 (BP Location: Left Arm, Patient Position: Sitting, Cuff Size: Large)   Pulse 85   Temp 99.3 F (37.4 C) (Temporal)   Ht 5' 6 (1.676 m)   Wt 216 lb 3.2 oz (98.1 kg)   LMP 08/13/2013 Comment: negative urine pregnancy test 12-2013  SpO2 97%   BMI 34.90 kg/m      Physical Exam Vitals and nursing note reviewed.  Constitutional:  Appearance: She is obese.  Cardiovascular:     Rate and Rhythm: Normal rate.     Pulses: Normal pulses.  Pulmonary:     Effort: Pulmonary effort is normal.  Musculoskeletal:     Right lower leg: No edema.     Left lower leg: No edema.  Neurological:     Mental Status: She is alert and oriented to person, place, and time.  Psychiatric:        Mood and Affect: Mood normal.        Behavior: Behavior normal.        Thought Content: Thought content normal.     Results for orders placed or performed in visit on 09/22/24  POCT glycosylated hemoglobin (Hb A1C)  Result Value Ref Range   Hemoglobin A1C 5.8 (A) 4.0 - 5.6 %   HbA1c POC (<> result, manual entry)     HbA1c, POC (prediabetic range)     HbA1c,  POC (controlled diabetic range)        Assessment & Plan:    Problem List Items Addressed This Visit     Essential hypertension   Relevant Medications   Phentermine -Topiramate  ER 3.75-23 MG CP24   Obesity - Primary   Insurance does not cover GLP-1RA injection. Unable to use contrave due to current use of cymbalta . Diet: she has cut down on sugary drinks and high carb foods Exercise walking 3x/week Wt Readings from Last 3 Encounters:  09/22/24 216 lb 3.2 oz (98.1 kg)  06/05/24 222 lb (100.7 kg)  05/07/24 218 lb (98.9 kg)    She declined referral to nutritionist at this time due to work schedule. We discussed ways to substitute high carb meals, and suggestions on high protein snacks, the importance of at least 2 balanced meals daily We discussed use of Qsymia and possible adverse effects.  She verbalized understanding and agreed to start med. Prescription sent F/up in 36month      Relevant Medications   Phentermine -Topiramate  ER 3.75-23 MG CP24   OSA (obstructive sleep apnea)   Relevant Medications   Phentermine -Topiramate  ER 3.75-23 MG CP24   Prediabetes   Repeat hgbA1c: 5.8% improved from 6.4%      Relevant Medications   Phentermine -Topiramate  ER 3.75-23 MG CP24   Other Relevant Orders   POCT glycosylated hemoglobin (Hb A1C) (Completed)   Return in about 4 weeks (around 10/20/2024) for Weight management.     Roselie Mood, NP

## 2024-09-22 NOTE — Telephone Encounter (Signed)
 Pharmacy Patient Advocate Encounter   Received notification from Pt Calls Messages that prior authorization for Phentermine -Topiramate  ER 3.75-23MG  er capsules  is required/requested.   Insurance verification completed.   The patient is insured through [facility].   Per test claim: PA required; PA submitted to above mentioned insurance via Latent Key/confirmation #/EOC [id number] Status is pending

## 2024-09-22 NOTE — Patient Instructions (Signed)
 Schedule fasting lab appt. Need to be fasting 8hrs prior to blood draw. Ok to drink water and take BP meds. Maintain Heart healthy diet and daily exercise.  How to Increase Your Level of Physical Activity Getting regular physical activity is important for your overall health and well-being. Most people do not get enough exercise. There are easy ways to increase your level of physical activity, even if you have not been very active in the past or if you are just starting out. What are the benefits of physical activity? Physical activity has many short-term and long-term benefits. Being active on a regular basis can improve your physical and mental health as well as provide other benefits. Physical health benefits Helping you lose weight or maintain a healthy weight. Strengthening your muscles and bones. Reducing your risk of certain long-term (chronic) diseases, including heart disease, cancer, and diabetes. Being able to move around more easily and for longer periods of time without getting tired (increased endurance or stamina). Improving your ability to fight off illness (enhanced immunity). Being able to sleep better. Helping you stay healthy as you get older, including: Helping you stay mobile, or capable of walking and moving around. Preventing accidents, such as falls. Increasing life expectancy. Mental health benefits Boosting your mood and improving your self-esteem. Lowering your chance of having mental health problems, such as depression or anxiety. Helping you feel good about your body. Other benefits Finding new sources of fun and enjoyment. Meeting new people who share a common interest. Before you begin If you have a chronic illness or have not been active for a while, check with your health care provider about how to get started. Ask your health care provider what activities are safe for you. Start out slowly. Walking or doing some simple chair exercises is a good place to  start, especially if you have not been active before or for a long time. Set goals that you can work toward. Ask your health care provider how much exercise is best for you. In general, most adults should: Do moderate-intensity exercise for at least 150 minutes each week (30 minutes on most days of the week) or vigorous exercise for at least 75 minutes each week, or a combination of these. Moderate-intensity exercise can include walking at a quick pace, biking, yoga, water aerobics, or gardening. Vigorous exercise involves activities that take more effort, such as jogging or running, playing sports, swimming laps, or jumping rope. Do strength exercises on at least 2 days each week. This can include weight lifting, body weight exercises, and resistance-band exercises. How to be more physically active Make a plan  Try to find activities that you enjoy. You are more likely to commit to an exercise routine if it does not feel like a chore. If you have bone or joint problems, choose low-impact exercises, like walking or swimming. Use these tips for being successful with an exercise plan: Find a workout partner for accountability. Join a group or class, such as an aerobics class, cycling class, or sports team. Make family time active. Go for a walk, bike, or swim. Include a variety of exercises each week. Consider using a fitness tracker, such as a mobile phone app or a device worn like a watch, that will count the number of steps you take each day. Many people strive to reach 10,000 steps a day. Find ways to be active in your daily routines Besides your formal exercise plans, you can find ways to do physical activity during  your daily routines, such as: Walking or biking to work or to the store. Taking the stairs instead of the elevator. Parking farther away from the door at work or at the store. Planning walking meetings. Walking around while you are on the phone. Where to find more  information Centers for Disease Control and Prevention: campuscasting.com.pt President's Council on Fitness, Sports & Nutrition: www.fitness.gov ChooseMyPlate: http://www.harvey.com/ Contact a health care provider if: You have headaches, muscle aches, or joint pain that is concerning. You feel dizzy or light-headed while exercising. You faint. You feel your heart skipping, racing, or fluttering. You have chest pain while exercising. Summary Exercise benefits your mind and body at any age, even if you are just starting out. If you have a chronic illness or have not been active for a while, check with your health care provider before increasing your physical activity. Choose activities that are safe and enjoyable for you. Ask your health care provider what activities are safe for you. Start slowly. Tell your health care provider if you have problems as you start to increase your activity level. This information is not intended to replace advice given to you by your health care provider. Make sure you discuss any questions you have with your health care provider. Document Revised: 03/11/2021 Document Reviewed: 03/11/2021 Elsevier Patient Education  2024 Elsevier Inc.  Mediterranean Diet A Mediterranean diet is based on the traditions of countries on the [employer]. It focuses on eating more: Fruits and vegetables. Whole grains, beans, nuts, and seeds. Heart-healthy fats. These are fats that are good for your heart. It involves eating less: Dairy. Meat and eggs. Processed foods with added sugar, salt, and fat. This type of diet can help prevent certain conditions. It can also improve outcomes if you have a long-term (chronic) disease, such as kidney or heart disease. What are tips for following this plan? Reading food labels Check packaged foods for: The serving size. For foods such as rice and pasta, the serving size is the amount of cooked product, not dry. The total fat.  Avoid foods with saturated fat or trans fat. Added sugars, such as corn syrup. Shopping  Try to have a balanced diet. Buy a variety of foods, such as: Fresh fruits and vegetables. You may be able to get these from local farmers markets. You can also buy them frozen. Grains, beans, nuts, and seeds. Some of these can be bought in bulk. Fresh seafood. Poultry and eggs. Low-fat dairy products. Buy whole ingredients instead of foods that have already been packaged. If you can't get fresh seafood, buy precooked frozen shrimp or canned fish, such as tuna, salmon, or sardines. Stock your pantry so you always have certain foods on hand, such as olive oil, canned tuna, canned tomatoes, rice, pasta, and beans. Cooking Cook foods with extra-virgin olive oil instead of using butter or other vegetable oils. Have meat as a side dish. Have vegetables or grains as your main dish. This means having meat in small portions or adding small amounts of meat to foods like pasta or stew. Use beans or vegetables instead of meat in common dishes like chili or lasagna. Try out different cooking methods. Try roasting, broiling, steaming, and sauting vegetables. Add frozen vegetables to soups, stews, pasta, or rice. Add nuts or seeds for added healthy fats and plant protein at each meal. You can add these to yogurt, salads, or vegetable dishes. Marinate fish or vegetables using olive oil, lemon juice, garlic, and fresh herbs. Meal planning  Plan to eat a vegetarian meal one day each week. Try to work up to two vegetarian meals, if possible. Eat seafood two or more times a week. Have healthy snacks on hand. These may include: Vegetable sticks with hummus. Greek yogurt. Fruit and nut trail mix. Eat balanced meals. These should include: Fruit: 2-3 servings a day. Vegetables: 4-5 servings a day. Low-fat dairy: 2 servings a day. Fish, poultry, or lean meat: 1 serving a day. Beans and legumes: 2 or more servings a  week. Nuts and seeds: 1-2 servings a day. Whole grains: 6-8 servings a day. Extra-virgin olive oil: 3-4 servings a day. Limit red meat and sweets to just a few servings a month. Lifestyle  Try to cook and eat meals with your family. Drink enough fluid to keep your pee (urine) pale yellow. Be active every day. This includes: Aerobic exercise, which is exercise that causes your heart to beat faster. Examples include running and swimming. Leisure activities like gardening, walking, or housework. Get 7-8 hours of sleep each night. Drink red wine if your provider says you can. A glass of wine is 5 oz (150 mL). You may be allowed to have: Up to 1 glass a day if you're female and not pregnant. Up to 2 glasses a day if you're female. What foods should I eat? Fruits Apples. Apricots. Avocado. Berries. Bananas. Cherries. Dates. Figs. Grapes. Lemons. Melon. Oranges. Peaches. Plums. Pomegranate. Vegetables Artichokes. Beets. Broccoli. Cabbage. Carrots. Eggplant. Green beans. Chard. Kale. Spinach. Onions. Leeks. Peas. Squash. Tomatoes. Peppers. Radishes. Grains Whole-grain pasta. Brown rice. Bulgur wheat. Polenta. Couscous. Whole-wheat bread. [place] Madeira. Meats and other proteins Beans. Almonds. Sunflower seeds. Pine nuts. Peanuts. Cod. Salmon. Scallops. Shrimp. Tuna. Tilapia. Clams. Oysters. Eggs. Chicken or turkey without skin. Dairy Low-fat milk. Cheese. Greek yogurt. Fats and oils Extra-virgin olive oil. Avocado oil. Grapeseed oil. Beverages Water. Red wine. Herbal tea. Sweets and desserts Greek yogurt with honey. Baked apples. Poached pears. Trail mix. Seasonings and condiments Basil. Cilantro. Coriander. Cumin. Mint. Parsley. Sage. Rosemary. Tarragon. Garlic. Oregano. Thyme. Pepper. Balsamic vinegar. Tahini. Hummus. Tomato sauce. Olives. Mushrooms. The items listed above may not be all the foods and drinks you can have. Talk to a dietitian to learn more. What foods should I  limit? This is a list of foods that should be eaten rarely. Fruits Fruit canned in syrup. Vegetables Deep-fried potatoes, like French fries. Grains Packaged pasta or rice dishes. Cereal with added sugar. Snacks with added sugar. Meats and other proteins Beef. Pork. Lamb. Chicken or turkey with skin. Hot dogs. [place]. Dairy Ice cream. Sour cream. Whole milk. Fats and oils Butter. Canola oil. Vegetable oil. Beef fat (tallow). Lard. Beverages Juice. Sugar-sweetened soft drinks. Beer. Liquor and spirits. Sweets and desserts Cookies. Cakes. Pies. Candy. Seasonings and condiments Mayonnaise. Pre-made sauces and marinades. The items listed above may not be all the foods and drinks you should limit. Talk to a dietitian to learn more. Where to find more information American Heart Association (AHA): heart.org This information is not intended to replace advice given to you by your health care provider. Make sure you discuss any questions you have with your health care provider. Document Revised: 02/25/2023 Document Reviewed: 02/25/2023 Elsevier Patient Education  2024 [employer].

## 2024-09-22 NOTE — Telephone Encounter (Signed)
 PA request has been Received. New Encounter has been or will be created for follow up. For additional info see Pharmacy Prior Auth telephone encounter from 09/22/24.

## 2024-09-23 ENCOUNTER — Other Ambulatory Visit (HOSPITAL_COMMUNITY): Payer: Self-pay

## 2024-09-25 ENCOUNTER — Other Ambulatory Visit (HOSPITAL_COMMUNITY): Payer: Self-pay

## 2024-09-25 NOTE — Telephone Encounter (Signed)
 Additional information has been requested from the patient's insurance in order to proceed with the prior authorization request. Requested information has been sent, or form has been filled out and faxed back to [phone]

## 2024-10-01 ENCOUNTER — Other Ambulatory Visit (HOSPITAL_COMMUNITY): Payer: Self-pay

## 2024-10-01 NOTE — Telephone Encounter (Signed)
 Pharmacy Patient Advocate Encounter  Received notification from [facility] that Prior Authorization for  Phentermine -Topiramate  ER 3.75-23MG  er capsules  has been APPROVED from 09/29/24 to 10/29/24. Ran test claim, Copay is $18.17. This test claim was processed through [facility]- copay amounts may vary at other pharmacies due to pharmacy/plan contracts, or as the patient moves through the different stages of their insurance plan.   PA #/Case ID/Reference #: [id number]

## 2024-10-02 ENCOUNTER — Other Ambulatory Visit (HOSPITAL_COMMUNITY): Payer: Self-pay

## 2024-10-09 ENCOUNTER — Ambulatory Visit: Admitting: Nurse Practitioner

## 2024-10-14 ENCOUNTER — Telehealth: Admitting: Neurology

## 2024-10-14 ENCOUNTER — Other Ambulatory Visit (HOSPITAL_COMMUNITY): Payer: Self-pay

## 2024-10-17 ENCOUNTER — Other Ambulatory Visit (HOSPITAL_COMMUNITY): Payer: Self-pay

## 2024-10-29 ENCOUNTER — Other Ambulatory Visit (HOSPITAL_COMMUNITY): Payer: Self-pay

## 2024-10-30 ENCOUNTER — Telehealth: Payer: Self-pay

## 2024-10-30 ENCOUNTER — Other Ambulatory Visit (HOSPITAL_COMMUNITY): Payer: Self-pay

## 2024-10-30 NOTE — Telephone Encounter (Signed)
 Called patient and left a HIPAA compliant voice message asking to give me a call back at the office at [phone]

## 2024-10-30 NOTE — Telephone Encounter (Signed)
 Called patient's pharmacy and spoke with pharmacy staff. I explained reason for my call. She informed me that the [facility] pharmacy did not reach out to our office that it was the liaison person  ho will

## 2024-10-30 NOTE — Telephone Encounter (Signed)
 Copied from CRM [id number]. Topic: Clinical - Medication Prior Auth >> Oct 29, 2024  8:59 AM Aleatha C wrote: Reason for CRM: Patient has been waiting on a Prior authorization since October for Phentermine -Topiramate  ER 3.75-23 MG CP24, pharmacy has reached out 3 times and patient hasn't received anything

## 2024-10-31 ENCOUNTER — Encounter: Admitting: Nurse Practitioner

## 2024-11-05 ENCOUNTER — Encounter: Payer: Self-pay | Admitting: Nurse Practitioner

## 2024-11-10 ENCOUNTER — Other Ambulatory Visit (HOSPITAL_COMMUNITY): Payer: Self-pay

## 2024-11-14 ENCOUNTER — Other Ambulatory Visit (HOSPITAL_COMMUNITY): Payer: Self-pay

## 2025-01-14 ENCOUNTER — Encounter: Admitting: Nurse Practitioner
